# Patient Record
Sex: Female | Born: 1945 | Race: White | Hispanic: No | Smoking: Never smoker
Health system: Southern US, Community
[De-identification: ages and names within clinical notes are randomized; demographics above are authoritative.]

## PROBLEM LIST (undated history)

## (undated) DIAGNOSIS — D241 Benign neoplasm of right breast: Secondary | ICD-10-CM

## (undated) DIAGNOSIS — Z87442 Personal history of urinary calculi: Secondary | ICD-10-CM

## (undated) DIAGNOSIS — Z98811 Dental restoration status: Secondary | ICD-10-CM

## (undated) DIAGNOSIS — I10 Essential (primary) hypertension: Secondary | ICD-10-CM

## (undated) DIAGNOSIS — E785 Hyperlipidemia, unspecified: Secondary | ICD-10-CM

## (undated) DIAGNOSIS — T884XXA Failed or difficult intubation, initial encounter: Secondary | ICD-10-CM

## (undated) DIAGNOSIS — E119 Type 2 diabetes mellitus without complications: Secondary | ICD-10-CM

## (undated) DIAGNOSIS — J302 Other seasonal allergic rhinitis: Secondary | ICD-10-CM

## (undated) DIAGNOSIS — M1990 Unspecified osteoarthritis, unspecified site: Secondary | ICD-10-CM

## (undated) DIAGNOSIS — R51 Headache: Secondary | ICD-10-CM

## (undated) DIAGNOSIS — Z8709 Personal history of other diseases of the respiratory system: Secondary | ICD-10-CM

## (undated) DIAGNOSIS — K219 Gastro-esophageal reflux disease without esophagitis: Secondary | ICD-10-CM

## (undated) DIAGNOSIS — R682 Dry mouth, unspecified: Secondary | ICD-10-CM

## (undated) DIAGNOSIS — R519 Headache, unspecified: Secondary | ICD-10-CM

## (undated) HISTORY — PX: TUBAL LIGATION: SHX77

---

## 1998-04-18 ENCOUNTER — Ambulatory Visit (HOSPITAL_COMMUNITY): Admission: RE | Admit: 1998-04-18 | Discharge: 1998-04-18 | Payer: Self-pay | Admitting: Gastroenterology

## 1999-11-10 ENCOUNTER — Other Ambulatory Visit: Admission: RE | Admit: 1999-11-10 | Discharge: 1999-11-10 | Payer: Self-pay | Admitting: Obstetrics and Gynecology

## 2000-07-21 ENCOUNTER — Encounter: Admission: RE | Admit: 2000-07-21 | Discharge: 2000-07-21 | Payer: Self-pay | Admitting: Orthopedic Surgery

## 2000-07-21 ENCOUNTER — Encounter: Payer: Self-pay | Admitting: Orthopedic Surgery

## 2010-03-05 DIAGNOSIS — K219 Gastro-esophageal reflux disease without esophagitis: Secondary | ICD-10-CM | POA: Insufficient documentation

## 2010-05-06 DIAGNOSIS — I10 Essential (primary) hypertension: Secondary | ICD-10-CM | POA: Insufficient documentation

## 2010-06-18 DIAGNOSIS — E559 Vitamin D deficiency, unspecified: Secondary | ICD-10-CM | POA: Insufficient documentation

## 2010-07-29 DIAGNOSIS — E1142 Type 2 diabetes mellitus with diabetic polyneuropathy: Secondary | ICD-10-CM | POA: Insufficient documentation

## 2010-07-29 DIAGNOSIS — E1129 Type 2 diabetes mellitus with other diabetic kidney complication: Secondary | ICD-10-CM | POA: Insufficient documentation

## 2010-07-29 DIAGNOSIS — Z794 Long term (current) use of insulin: Secondary | ICD-10-CM | POA: Insufficient documentation

## 2010-08-28 DIAGNOSIS — E782 Mixed hyperlipidemia: Secondary | ICD-10-CM | POA: Insufficient documentation

## 2011-01-01 DIAGNOSIS — F411 Generalized anxiety disorder: Secondary | ICD-10-CM | POA: Insufficient documentation

## 2012-11-02 ENCOUNTER — Ambulatory Visit
Admission: RE | Admit: 2012-11-02 | Discharge: 2012-11-02 | Disposition: A | Discharge status: Home / Self Care | Payer: Medicare Other | Source: Ambulatory Visit | Attending: Emergency Medicine | Admitting: Emergency Medicine

## 2012-11-02 ENCOUNTER — Ambulatory Visit (INDEPENDENT_AMBULATORY_CARE_PROVIDER_SITE_OTHER): Payer: Medicare Other | Admitting: Emergency Medicine

## 2012-11-02 ENCOUNTER — Telehealth: Payer: Self-pay | Admitting: Radiology

## 2012-11-02 ENCOUNTER — Ambulatory Visit: Payer: Medicare Other

## 2012-11-02 ENCOUNTER — Other Ambulatory Visit: Payer: Self-pay | Admitting: Radiology

## 2012-11-02 VITALS — BP 128/86 | HR 79 | Temp 98.4°F | Resp 18 | Wt 212.0 lb

## 2012-11-02 DIAGNOSIS — R3989 Other symptoms and signs involving the genitourinary system: Secondary | ICD-10-CM

## 2012-11-02 DIAGNOSIS — M545 Low back pain: Secondary | ICD-10-CM

## 2012-11-02 DIAGNOSIS — R1084 Generalized abdominal pain: Secondary | ICD-10-CM

## 2012-11-02 DIAGNOSIS — R109 Unspecified abdominal pain: Secondary | ICD-10-CM

## 2012-11-02 DIAGNOSIS — N200 Calculus of kidney: Secondary | ICD-10-CM

## 2012-11-02 DIAGNOSIS — R319 Hematuria, unspecified: Secondary | ICD-10-CM

## 2012-11-02 LAB — POCT URINALYSIS DIPSTICK
Bilirubin, UA: NEGATIVE
Glucose, UA: NEGATIVE
Nitrite, UA: NEGATIVE
Protein, UA: 30
Spec Grav, UA: >=1.03
Urobilinogen, UA: 0.2
pH, UA: 5.5

## 2012-11-02 LAB — POCT UA - MICROSCOPIC ONLY
Casts, Ur, LPF, POC: NEGATIVE
Crystals, Ur, HPF, POC: NEGATIVE
Mucus, UA: NEGATIVE
Yeast, UA: NEGATIVE

## 2012-11-02 LAB — POCT CBC
Granulocyte percent: 64.1 %G (ref 37–80)
MID (cbc): 0.4 (ref 0–0.9)
MPV: 7.8 fL (ref 0–99.8)
POC MID %: 6 %M (ref 0–12)
Platelet Count, POC: 185 10*3/uL (ref 142–424)
RBC: 4.66 M/uL (ref 4.04–5.48)

## 2012-11-02 MED ORDER — CIPROFLOXACIN HCL 250 MG PO TABS: 250.0000 mg | ORAL_TABLET | Freq: Two times a day (BID) | ORAL | Status: DC

## 2012-11-02 MED ORDER — HYDROCODONE-ACETAMINOPHEN 5-325 MG PO TABS: 1.0000 | ORAL_TABLET | Freq: Four times a day (QID) | ORAL | Status: DC | PRN

## 2012-11-02 NOTE — Patient Instructions (Addendum)

## 2012-11-02 NOTE — Telephone Encounter (Signed)
Patient has appt scheduled with Dr Isabel Caprice for Friday 3/14 at 1030 am

## 2012-11-02 NOTE — Progress Notes (Signed)
  Subjective:    Patient ID: Janice Payne, female    DOB: February 06, 1946, 67 y.o.   MRN: [id number]  Back Pain This is a recurrent problem. The current episode started in the past 7 days. The problem occurs daily. The pain is present in the lumbar spine. The quality of the pain is described as aching. The pain does not radiate. The pain is at a severity of 5/10. The pain is mild. Pertinent negatives include no fever.    Janice Payne comes into our office today with complaints of lower back pain on her left side that's been going on for 1 week. She also has complaints with her urine being cloudy for 1 week also She went to [facility] to check her back out and they told her she pulled a muscle in her back they didn't check her urine or do a xray of back  Review of Systems  Constitutional: Negative for fever, chills and fatigue.  Gastrointestinal: Negative for nausea.  Musculoskeletal: Positive for back pain.       Objective:   Physical Exam There is mild tenderness left flank area. There is mild LLQ abdominal pain.  Results for orders placed in visit on 11/02/12  POCT UA - MICROSCOPIC ONLY      Result Value Range   WBC, Ur, HPF, POC 6-10     RBC, urine, microscopic 5-8     Bacteria, U Microscopic 1+     Mucus, UA neg     Epithelial cells, urine per micros 3-5     Crystals, Ur, HPF, POC neg     Casts, Ur, LPF, POC neg     Yeast, UA neg    POCT URINALYSIS DIPSTICK      Result Value Range   Color, UA amber     Clarity, UA clear     Glucose, UA neg     Bilirubin, UA neg     Ketones, UA trace     Spec Grav, UA >=1.030     Blood, UA large     pH, UA 5.5     Protein, UA 30     Urobilinogen, UA 0.2     Nitrite, UA neg     Leukocytes, UA Trace    POCT CBC      Result Value Range   WBC 6.8  4.6 - 10.2 K/uL   Lymph, poc 2.0  0.6 - 3.4   POC LYMPH PERCENT 29.9  10 - 50 %L   MID (cbc) 0.4  0 - 0.9   POC MID % 6.0  0 - 12 %M   POC Granulocyte 4.4  2 - 6.9   Granulocyte percent 64.1  37 -  80 %G   RBC 4.66  4.04 - 5.48 M/uL   Hemoglobin 13.0  12.2 - 16.2 g/dL   HCT, POC 27.2  53.6 - 47.9 %   MCV 88.7  80 - 97 fL   MCH, POC 27.9  27 - 31.2 pg   MCHC 31.5 (*) 31.8 - 35.4 g/dL   RDW, POC 64.4     Platelet Count, POC 185  142 - 424 K/uL   MPV 7.8  0 - 99.8 fL  [facility] reading (PRIMARY) by  Dr.Daub 3-4 mm calcification on left kidney       Assessment & Plan:  Need to do CT urogram for suspected stone on left kidney

## 2012-11-03 ENCOUNTER — Encounter: Payer: Self-pay | Admitting: Radiology

## 2012-11-03 DIAGNOSIS — N200 Calculus of kidney: Secondary | ICD-10-CM | POA: Insufficient documentation

## 2012-11-03 LAB — URINE CULTURE: Colony Count: 4000

## 2012-11-07 ENCOUNTER — Other Ambulatory Visit: Payer: Self-pay | Admitting: Emergency Medicine

## 2012-11-07 ENCOUNTER — Telehealth: Payer: Self-pay

## 2012-11-07 DIAGNOSIS — M545 Low back pain: Secondary | ICD-10-CM

## 2012-11-07 MED ORDER — HYDROCODONE-ACETAMINOPHEN 5-325 MG PO TABS: 1.0000 | ORAL_TABLET | Freq: Four times a day (QID) | ORAL | Status: DC | PRN

## 2012-11-07 NOTE — Telephone Encounter (Signed)
HYDROcodone-acetaminophen (NORCO) 5-325 MG per tablet  Request refill for the above - kidney stones   [facility] [address]  [phone] (H)

## 2012-11-07 NOTE — Telephone Encounter (Signed)
Is okay to refill her pain medication. Patient is now under the care of the urologist and is contemplating lithotripsy to her stone. Have her keep [place] informed regarding her progress

## 2012-11-07 NOTE — Telephone Encounter (Signed)
Called in- patient advised

## 2012-11-09 ENCOUNTER — Other Ambulatory Visit: Payer: Self-pay | Admitting: Urology

## 2012-11-10 ENCOUNTER — Encounter (HOSPITAL_COMMUNITY): Payer: Self-pay | Admitting: *Deleted

## 2012-11-10 NOTE — Pre-Procedure Instructions (Signed)
Asked to bring blue folder the day of the procedure,insurance card,I.D. driver's license,wear comfortable clothing and have a driver for the day. Asked not to take Advil,Motrin,Ibuprofen,Aleve or any NSAIDS, Aspirin, or Toradol for 72 hours prior to procedure,  No vitamins or herbal medications 7 days prior to procedure. Instructed to take laxative per doctor's office instructions and eat a light dinner the evening before procedure.   To arrive at 0800  for lithotripsy procedure.

## 2012-11-13 ENCOUNTER — Encounter (HOSPITAL_COMMUNITY): Payer: Self-pay | Admitting: Pharmacy Technician

## 2012-11-16 ENCOUNTER — Other Ambulatory Visit: Payer: Self-pay | Admitting: Emergency Medicine

## 2012-11-24 NOTE — H&P (Signed)
History of Present Illness   Janice Payne presents today as a referral from [facility]/Dr. Earl Lites for further assessment and potential treatment of a relatively newly diagnosed left renal calculus. [name] has had some intermittent nonspecific low left back discomfort. The patient's pain does on occasion radiate up her back as well. She has noticed some rusty/brown-colored urine as well. She has had a variety of imaging studies. She did have an abdominal x-ray, which suggested a 9 mm stone over the left kidney shadow. She also had lumbar films, which had suggested this calcification may be as big as 12 mm. More definitively, she did have a CT scan, which revealed a 6 x 9 mm calcification in the left renal pelvis. There was no renal obstruction at that time, but it was felt that the stone may be intermittently obstructing. Urinalysis today does show a continued presence of microhematuria. Urine pH is 6.0. There is no evidence of active infection. She has no prior history of nephrolithiasis. Her sister has had a stone in the past.       Past Medical History Problems  1. History of  Anxiety (Symptom) 300.00 2. History of  Arthritis V13.4 3. History of  Diabetes Mellitus 250.00 4. History of  Heartburn 787.1 5. History of  Hypercholesterolemia 272.0 6. History of  Hypertension 401.9  Surgical History Problems  1. History of  No Surgical Problems  Current Meds 1. Adult Aspirin Low Strength 81 MG Oral Tablet Dispersible; Therapy: (Recorded:14Mar2014) to 2. ALPRAZolam 0.5 MG Oral Tablet; Therapy: 14Oct2013 to 3. Calcium + D TABS; Therapy: (Recorded:14Mar2014) to 4. Cholecalciferol CRYS; Therapy: (Recorded:14Mar2014) to 5. Fenofibrate 160 MG Oral Tablet; Therapy: 31Oct2013 to 6. Hydrocodone-Acetaminophen 5-325 MG Oral Tablet; Therapy: 13Mar2014 to 7. Losartan Potassium 50 MG Oral Tablet; Therapy: 28Dec2012 to 8. MetFORMIN HCl 500 MG Oral Tablet; Therapy: (Recorded:14Mar2014)  to 9. Metoprolol Succinate ER 50 MG Oral Tablet Extended Release 24 Hour; Therapy: 27Sep2013 to 10. Pravastatin Sodium 20 MG Oral Tablet; Therapy: 03Oct2013 to 11. Sertraline HCl 50 MG Oral Tablet; Therapy: 10Jul2013 to  Allergies Medication  1. Penicillins 2. Codeine Derivatives  Family History Problems  1. Paternal history of  Acute Myocardial Infarction V17.3 2. Paternal history of  Death In The Family Father 68yrs, MI 3. Maternal history of  Death In The Family Mother 72yrs 4. Family history of  Family Health Status Number Of Children 3 sons 5. Sororal history of  Nephrolithiasis  Social History Problems    Caffeine Use 2-3 qd   Marital History - Widowed   Never A Smoker   Occupation: retired Denied    History of  Alcohol Use   History of  Tobacco Use  Review of Systems Genitourinary, constitutional, skin, eye, otolaryngeal, hematologic/lymphatic, cardiovascular, pulmonary, endocrine, musculoskeletal, gastrointestinal, neurological and psychiatric system(s) were reviewed and pertinent findings if present are noted.  Genitourinary: urinary urgency, nocturia and hematuria.  Musculoskeletal: back pain.    Vitals Vital Signs [Data Includes: Last 1 Day]  14Mar2014 11:00AM  BMI Calculated: 36.19 BSA Calculated: 2.01 Height: 5 ft 4 in Weight: 212 lb  Blood Pressure: 145 / 77 Temperature: 97.2 F Heart Rate: 83  Physical Exam Constitutional: Well nourished and well developed . No acute distress.  ENT:. The ears and nose are normal in appearance.  Neck: The appearance of the neck is normal and no neck mass is present.  Pulmonary: No respiratory distress and normal respiratory rhythm and effort.  Cardiovascular: Heart rate and rhythm are normal .  No peripheral edema.  Abdomen: The abdomen is soft and nontender. No masses are palpated. No CVA tenderness. No hernias are palpable. No hepatosplenomegaly noted.  Lymphatics: The femoral and inguinal nodes are not  enlarged or tender.  Skin: Normal skin turgor, no visible rash and no visible skin lesions.  Neuro/Psych:. Mood and affect are appropriate.    Results/Data Urine [Data Includes: Last 1 Day]   14Mar2014  COLOR YELLOW   APPEARANCE CLEAR   SPECIFIC GRAVITY 1.025   pH 6.0   GLUCOSE NEG mg/dL  BILIRUBIN NEG   KETONE NEG mg/dL  BLOOD MOD   PROTEIN NEG mg/dL  UROBILINOGEN 0.2 mg/dL  NITRITE NEG   LEUKOCYTE ESTERASE NEG   SQUAMOUS EPITHELIAL/HPF RARE   WBC 0-2 WBC/hpf  RBC 7-10 RBC/hpf  BACTERIA RARE   CRYSTALS NONE SEEN   CASTS NONE SEEN    Assessment Assessed  1. Nephrolithiasis 592.0 2. Gross Hematuria 599.71  Discussion/Summary   [name] has been having some nonspecific left lower-back discomfort. There are aspects of her pain that are consistent with renal colic and other aspects that are more suggestive of musculoskeletal etiology. She does, however, have a 9+ mm stone in her left renal pelvis. It is currently not causing any obstruction but certainly may be ball-valving in and out and intermittently causing some blockage as well as the hematuria. Certainly, this stone should be treated. I did tell her, however, I cannot guarantee her that her back pain will resolve. Hounsfield units on the stone is in the 4-500 range. I am able to see the stone on scout imaging, but it is somewhat faint. This would suggest, however, that the stone is not terribly dense and I do think a good candidate's stone for ESWL. I would be hopeful that with this stone burden, she would be able to pass the fragments without much difficulty and have quoted her approximately an 80% chance of success with ESWL. We did talk about the pros and cons of that procedure. We will attempt to get her on the schedule for the next available opening for ESWL to treat this, along with some routine followup.

## 2012-11-27 ENCOUNTER — Ambulatory Visit (HOSPITAL_COMMUNITY)
Admission: RE | Admit: 2012-11-27 | Discharge: 2012-11-27 | Disposition: A | Discharge status: Home / Self Care | Payer: Medicare Other | Source: Ambulatory Visit | Attending: Urology | Admitting: Urology

## 2012-11-27 ENCOUNTER — Encounter (HOSPITAL_COMMUNITY): Payer: Self-pay | Admitting: *Deleted

## 2012-11-27 ENCOUNTER — Telehealth: Payer: Self-pay

## 2012-11-27 ENCOUNTER — Encounter (HOSPITAL_COMMUNITY)
Admission: RE | Disposition: A | Discharge status: Home / Self Care | Payer: Self-pay | Source: Ambulatory Visit | Attending: Urology

## 2012-11-27 ENCOUNTER — Ambulatory Visit (HOSPITAL_COMMUNITY): Payer: Medicare Other

## 2012-11-27 DIAGNOSIS — N210 Calculus in bladder: Secondary | ICD-10-CM | POA: Insufficient documentation

## 2012-11-27 DIAGNOSIS — R3129 Other microscopic hematuria: Secondary | ICD-10-CM | POA: Insufficient documentation

## 2012-11-27 DIAGNOSIS — N200 Calculus of kidney: Secondary | ICD-10-CM | POA: Insufficient documentation

## 2012-11-27 HISTORY — DX: Essential (primary) hypertension: I10

## 2012-11-27 HISTORY — PX: EXTRACORPOREAL SHOCK WAVE LITHOTRIPSY: SHX1557

## 2012-11-27 LAB — BASIC METABOLIC PANEL
Calcium: 9.4 mg/dL (ref 8.4–10.5)
GFR calc Af Amer: 63 mL/min — ABNORMAL LOW (ref >90)
GFR calc non Af Amer: 54 mL/min — ABNORMAL LOW (ref >90)
Glucose, Bld: 172 mg/dL — ABNORMAL HIGH (ref 70–99)
Potassium: 3.6 mEq/L (ref 3.5–5.1)
Sodium: 136 mEq/L (ref 135–145)

## 2012-11-27 SURGERY — LITHOTRIPSY, ESWL
Anesthesia: LOCAL | Laterality: Left

## 2012-11-27 MED ORDER — DEXTROSE-NACL 5-0.45 % IV SOLN
INTRAVENOUS | Status: DC
  Administered 2012-11-27: 09:00:00 via INTRAVENOUS

## 2012-11-27 MED ORDER — DIPHENHYDRAMINE HCL 25 MG PO CAPS
25.0000 mg | ORAL_CAPSULE | ORAL | Status: AC
  Administered 2012-11-27: 25 mg via ORAL
  Filled 2012-11-27: qty 1

## 2012-11-27 MED ORDER — DIAZEPAM 5 MG PO TABS
10.0000 mg | ORAL_TABLET | ORAL | Status: AC
  Administered 2012-11-27: 10 mg via ORAL
  Filled 2012-11-27: qty 2

## 2012-11-27 MED ORDER — DIAZEPAM 5 MG PO TABS
ORAL_TABLET | ORAL | Status: AC
  Filled 2012-11-27: qty 1

## 2012-11-27 MED ORDER — HYDROCODONE-ACETAMINOPHEN 5-325 MG PO TABS: ORAL_TABLET | ORAL | Status: DC

## 2012-11-27 MED ORDER — CIPROFLOXACIN HCL 500 MG PO TABS
500.0000 mg | ORAL_TABLET | ORAL | Status: AC
  Administered 2012-11-27: 500 mg via ORAL
  Filled 2012-11-27: qty 1

## 2012-11-27 MED ORDER — DIAZEPAM 5 MG PO TABS
ORAL_TABLET | ORAL | Status: DC
Start: 2012-11-27 — End: 2012-11-27
  Filled 2012-11-27: qty 1

## 2012-11-27 NOTE — Telephone Encounter (Signed)
Patient had her kidney stones blasted and the surgeon told her to call dr Cleta Alberts to get her pain meds filled she would need that called [address] [facility] [phone]

## 2012-11-27 NOTE — Telephone Encounter (Signed)
It is okay to go ahead and refill her medications.

## 2012-11-27 NOTE — Progress Notes (Signed)
Reddened area on left flank from lithotripsy. No drainage or edema noted.

## 2012-11-27 NOTE — Telephone Encounter (Signed)
Called in for her, called her to advise.

## 2012-11-27 NOTE — Interval H&P Note (Signed)
History and Physical Interval Note:  11/27/2012 11:35 AM  Janice Payne  has presented today for surgery, with the diagnosis of Left Renal Calculus  The various methods of treatment have been discussed with the patient and family. After consideration of risks, benefits and other options for treatment, the patient has consented to  Procedure(s): EXTRACORPOREAL SHOCK WAVE LITHOTRIPSY (ESWL) (Left) as a surgical intervention .  The patient's history has been reviewed, patient examined, no change in status, stable for surgery.  I have reviewed the patient's chart and labs.  Questions were answered to the patient's satisfaction.     Brittannie Tawney S

## 2012-11-27 NOTE — Telephone Encounter (Signed)
Please advise on hydrocodone refill, pended

## 2012-11-27 NOTE — Op Note (Signed)
See [facility] OP note scanned into chart. 

## 2012-12-01 ENCOUNTER — Other Ambulatory Visit: Payer: Self-pay | Admitting: Emergency Medicine

## 2013-05-08 DIAGNOSIS — N1832 Chronic kidney disease, stage 3b: Secondary | ICD-10-CM | POA: Insufficient documentation

## 2013-05-08 DIAGNOSIS — N183 Chronic kidney disease, stage 3 unspecified: Secondary | ICD-10-CM | POA: Insufficient documentation

## 2013-10-01 LAB — HM MAMMOGRAPHY

## 2013-10-08 ENCOUNTER — Encounter (INDEPENDENT_AMBULATORY_CARE_PROVIDER_SITE_OTHER): Payer: Self-pay | Admitting: Surgery

## 2013-10-08 ENCOUNTER — Ambulatory Visit (INDEPENDENT_AMBULATORY_CARE_PROVIDER_SITE_OTHER): Payer: Medicare Other | Admitting: Surgery

## 2013-10-08 VITALS — BP 132/80 | HR 88 | Temp 98.3°F | Resp 14 | Ht 65.5 in | Wt 213.8 lb

## 2013-10-08 DIAGNOSIS — D249 Benign neoplasm of unspecified breast: Secondary | ICD-10-CM | POA: Insufficient documentation

## 2013-10-08 NOTE — Patient Instructions (Signed)
Lumpectomy A lumpectomy is a form of "breast conserving" or "breast preservation" surgery. It may also be referred to as a partial mastectomy. During a lumpectomy, the portion of the breast that contains the cancerous tumor or breast mass (the lump) is removed. Some normal tissue around the lump may also be removed to make sure all the tumor has been removed. This surgery should take 40 minutes or less. LET YOUR HEALTH CARE PROVIDER KNOW ABOUT:  Any allergies you have.  All medicines you are taking, including vitamins, herbs, eye drops, creams, and over-the-counter medicines.  Previous problems you or members of your family have had with the use of anesthetics.  Any blood disorders you have.  Previous surgeries you have had.  Medical conditions you have. RISKS AND COMPLICATIONS Generally, this is a safe procedure. However, as with any procedure, complications can occur. Possible complications include:  Bleeding.  Infection.  Pain.  Temporary swelling.  Change in the shape of the breast, particularly if a large portion is removed. BEFORE THE PROCEDURE  Ask your health care provider about changing or stopping your regular medicines.  Do not eat or drink anything for 7 8 hours before the surgery or as directed by your health care provider. Ask your health care provider if you can take a sip of water with any approved medicines.  On the day of surgery, your healthcare provider will use a mammogram or ultrasound to locate and mark the tumor in your breast. These markings on your breast will show where the cut (incision) will be made. PROCEDURE   An IV tube will be put into one of your veins.  You may be given medicine to help you relax before the surgery (sedative). You will be given one of the following:  A medicine that numbs the area (local anesthesia).  A medicine that makes you go to sleep (general anesthesia).  Your health care provider will use a kind of electric scalpel  that uses heat to minimize bleeding (electrocautery knife).  A curved incision (like a smile or frown) that follows the natural curve of your breast is made, to allow for minimal scarring and better healing.  The tumor will be removed with some of the surrounding tissue. This will be sent to the lab for analysis. Your health care provider may also remove your lymph nodes at this time if needed.  Sometimes, but not always, a rubber tube called a drain will be surgically inserted into your breast area or armpit to collect excess fluid that may accumulate in the space where the tumor was. This drain is connected to a plastic bulb on the outside of your body. This drain creates suction to help remove the fluid.  The incisions will be closed with stitches (sutures).  A bandage may be placed over the incisions. AFTER THE PROCEDURE  You will be taken to the recovery area.  You will be given medicine for pain.  A small rubber drain may be placed in the breast for 2 3 days to prevent a collection of blood (hematoma) from developing in the breast. You will be given instructions on caring for the drain before you go home.  A pressure bandage (dressing) will be applied for 1 2 days to prevent bleeding. Ask your health care provider how to care for your bandage at home. Document Released: 09/20/2006 Document Revised: 04/11/2013 Document Reviewed: 01/12/2013 Exit[employer] Patient Information 2014 [employer].  

## 2013-10-08 NOTE — Progress Notes (Signed)
Patient ID: Janice Payne, female   DOB: 1946-01-24, 68 y.o.   MRN: [id number]  Chief Complaint  Patient presents with  . New Evaluation    eval Rt br papilloma    HPI Janice Payne is a 68 y.o. female.  Patient sent at the request of Dr. Christene Slates of [facility] for right breast papilloma. Patient had area of mammographic abnormality right central lower breast on routine screening mammography. Core biopsy showed papilloma. She denies any history of breast pain, nipple discharge or breast mass bilaterally. HPI  Past Medical History  Diagnosis Date  . Allergy   . Diabetes mellitus without complication   . Hypertension   . Asthma     as child  . Pneumonia     h/o 2012  . Chronic kidney disease     kidney stone    Past Surgical History  Procedure Laterality Date  . Tubal ligation    .  mole removed from back      Family History  Problem Relation Age of Onset  . Heart disease Father     Social History History  Substance Use Topics  . Smoking status: Never Smoker   . Smokeless tobacco: Never Used  . Alcohol Use: No    Allergies  Allergen Reactions  . Codeine Nausea And Vomiting  . Niaspan  [Niacin Er] Itching  . Penicillins Rash    Current Outpatient Prescriptions  Medication Sig Dispense Refill  . ALPRAZolam (XANAX) 0.5 MG tablet Take 0.5 mg by mouth at bedtime as needed for sleep.      Marland Kitchen aspirin 81 MG tablet Take 81 mg by mouth daily.      . calcium-vitamin D (OSCAL WITH D) 500-200 MG-UNIT per tablet Take 1 tablet by mouth 2 (two) times daily.      . cetirizine (ZYRTEC) 10 MG tablet Take 10 mg by mouth daily.      . Cholecalciferol (VITAMIN D) 2000 UNITS tablet Take 2,000 Units by mouth daily.      . fenofibrate 160 MG tablet Take 160 mg by mouth daily before breakfast.      . glimepiride (AMARYL) 2 MG tablet       . losartan (COZAAR) 50 MG tablet Take 50 mg by mouth daily before breakfast.      . metFORMIN (GLUCOPHAGE-XR) 500 MG 24 hr  tablet Take 500 mg by mouth 2 (two) times daily.      . metoprolol succinate (TOPROL-XL) 50 MG 24 hr tablet Take 50 mg by mouth daily before breakfast. Take with or immediately following a meal.      . pravastatin (PRAVACHOL) 20 MG tablet Take 20 mg by mouth at bedtime.       . sertraline (ZOLOFT) 50 MG tablet Take 50 mg by mouth at bedtime.        No current facility-administered medications for this visit.    Review of Systems Review of Systems  Constitutional: Negative for fever, chills and unexpected weight change.  HENT: Negative for congestion, hearing loss, sore throat, trouble swallowing and voice change.   Eyes: Negative for visual disturbance.  Respiratory: Negative for cough and wheezing.   Cardiovascular: Negative for chest pain, palpitations and leg swelling.  Gastrointestinal: Negative for nausea, vomiting, abdominal pain, diarrhea, constipation, blood in stool, abdominal distention and anal bleeding.  Genitourinary: Negative for hematuria, vaginal bleeding and difficulty urinating.  Musculoskeletal: Negative for arthralgias.  Skin: Negative for rash and wound.  Neurological: Negative for  seizures, syncope and headaches.  Hematological: Negative for adenopathy. Does not bruise/bleed easily.  Psychiatric/Behavioral: Negative for confusion.    Blood pressure 132/80, pulse 88, temperature 98.3 F (36.8 C), temperature source Oral, resp. rate 14, height 5' 5.5" (1.664 m), weight 213 lb 12.8 oz (96.979 kg).  Physical Exam Physical Exam  Constitutional: She is oriented to person, place, and time. She appears well-developed and well-nourished.  HENT:  Head: Normocephalic and atraumatic.  Eyes: Pupils are equal, round, and reactive to light. No scleral icterus.  Neck: Normal range of motion.  Cardiovascular: Normal rate and regular rhythm.   Pulmonary/Chest: Effort normal and breath sounds normal. Right breast exhibits no inverted nipple, no mass, no nipple discharge, no skin  change and no tenderness. Left breast exhibits no nipple discharge, no skin change and no tenderness. Breasts are symmetrical.    Musculoskeletal: Normal range of motion.  Neurological: She is alert and oriented to person, place, and time.  Skin: Skin is warm and dry.  Psychiatric: She has a normal mood and affect. Her behavior is normal. Judgment and thought content normal.    Data Reviewed Mammogram [facility] and pathology   papilloma  Assessment    Right breast papilloma core biopsy    Plan    Recommended excision of right breast papilloma needle localized. Risk of malignancy is 2-3%.The procedure has been discussed with the patient. Alternatives to surgery have been discussed with the patient.  Risks of surgery include bleeding,  Infection,  Seroma formation, death,  and the need for further surgery.   The patient understands and wishes to proceed.       [name] 10/08/2013, 12:03 PM

## 2013-10-21 DIAGNOSIS — D241 Benign neoplasm of right breast: Secondary | ICD-10-CM

## 2013-10-21 HISTORY — DX: Benign neoplasm of right breast: D24.1

## 2013-10-23 ENCOUNTER — Encounter (INDEPENDENT_AMBULATORY_CARE_PROVIDER_SITE_OTHER): Payer: Self-pay

## 2013-11-08 ENCOUNTER — Encounter (HOSPITAL_BASED_OUTPATIENT_CLINIC_OR_DEPARTMENT_OTHER): Payer: Self-pay | Admitting: *Deleted

## 2013-11-08 NOTE — Pre-Procedure Instructions (Signed)
To come for EKG 

## 2013-11-12 ENCOUNTER — Other Ambulatory Visit: Payer: Self-pay

## 2013-11-12 ENCOUNTER — Encounter (HOSPITAL_BASED_OUTPATIENT_CLINIC_OR_DEPARTMENT_OTHER)
Admission: RE | Admit: 2013-11-12 | Discharge: 2013-11-12 | Disposition: A | Discharge status: Home / Self Care | Payer: Medicare Other | Source: Ambulatory Visit | Attending: Surgery | Admitting: Surgery

## 2013-11-14 ENCOUNTER — Encounter (HOSPITAL_BASED_OUTPATIENT_CLINIC_OR_DEPARTMENT_OTHER): Payer: Self-pay

## 2013-11-14 ENCOUNTER — Encounter (HOSPITAL_BASED_OUTPATIENT_CLINIC_OR_DEPARTMENT_OTHER): Payer: Medicare Other | Admitting: Certified Registered"

## 2013-11-14 ENCOUNTER — Encounter (HOSPITAL_BASED_OUTPATIENT_CLINIC_OR_DEPARTMENT_OTHER)
Admission: RE | Disposition: A | Discharge status: Home / Self Care | Payer: Self-pay | Source: Ambulatory Visit | Attending: Surgery

## 2013-11-14 ENCOUNTER — Ambulatory Visit (HOSPITAL_BASED_OUTPATIENT_CLINIC_OR_DEPARTMENT_OTHER): Payer: Medicare Other | Admitting: Certified Registered"

## 2013-11-14 ENCOUNTER — Ambulatory Visit (HOSPITAL_BASED_OUTPATIENT_CLINIC_OR_DEPARTMENT_OTHER)
Admission: RE | Admit: 2013-11-14 | Discharge: 2013-11-14 | Disposition: A | Discharge status: Home / Self Care | Payer: Medicare Other | Source: Ambulatory Visit | Attending: Surgery | Admitting: Surgery

## 2013-11-14 DIAGNOSIS — D249 Benign neoplasm of unspecified breast: Secondary | ICD-10-CM | POA: Insufficient documentation

## 2013-11-14 DIAGNOSIS — E119 Type 2 diabetes mellitus without complications: Secondary | ICD-10-CM | POA: Insufficient documentation

## 2013-11-14 DIAGNOSIS — I10 Essential (primary) hypertension: Secondary | ICD-10-CM | POA: Insufficient documentation

## 2013-11-14 DIAGNOSIS — Z01810 Encounter for preprocedural cardiovascular examination: Secondary | ICD-10-CM | POA: Insufficient documentation

## 2013-11-14 HISTORY — DX: Personal history of urinary calculi: Z87.442

## 2013-11-14 HISTORY — DX: Hyperlipidemia, unspecified: E78.5

## 2013-11-14 HISTORY — DX: Gastro-esophageal reflux disease without esophagitis: K21.9

## 2013-11-14 HISTORY — DX: Headache, unspecified: R51.9

## 2013-11-14 HISTORY — DX: Other seasonal allergic rhinitis: J30.2

## 2013-11-14 HISTORY — PX: BREAST BIOPSY: SHX20

## 2013-11-14 HISTORY — DX: Dental restoration status: Z98.811

## 2013-11-14 HISTORY — DX: Headache: R51

## 2013-11-14 HISTORY — DX: Dry mouth, unspecified: R68.2

## 2013-11-14 HISTORY — DX: Unspecified osteoarthritis, unspecified site: M19.90

## 2013-11-14 HISTORY — DX: Benign neoplasm of right breast: D24.1

## 2013-11-14 HISTORY — DX: Personal history of other diseases of the respiratory system: Z87.09

## 2013-11-14 HISTORY — DX: Type 2 diabetes mellitus without complications: E11.9

## 2013-11-14 LAB — GLUCOSE, CAPILLARY
Glucose-Capillary: 150 mg/dL — ABNORMAL HIGH (ref 70–99)
Glucose-Capillary: 129 mg/dL — ABNORMAL HIGH (ref 70–99)

## 2013-11-14 LAB — POCT HEMOGLOBIN-HEMACUE: HEMOGLOBIN: 14 g/dL (ref 12.0–15.0)

## 2013-11-14 SURGERY — BREAST BIOPSY WITH NEEDLE LOCALIZATION
Anesthesia: General | Laterality: Right

## 2013-11-14 MED ORDER — ONDANSETRON HCL 4 MG/2ML IJ SOLN
INTRAMUSCULAR | Status: DC | PRN
  Administered 2013-11-14: 4 mg via INTRAVENOUS

## 2013-11-14 MED ORDER — CHLORHEXIDINE GLUCONATE 4 % EX LIQD: 1.0000 "application " | Freq: Once | CUTANEOUS | Status: DC

## 2013-11-14 MED ORDER — MEPERIDINE HCL 25 MG/ML IJ SOLN: 6.2500 mg | INTRAMUSCULAR | Status: DC | PRN

## 2013-11-14 MED ORDER — EPHEDRINE SULFATE 50 MG/ML IJ SOLN
INTRAMUSCULAR | Status: DC | PRN
  Administered 2013-11-14: 5 mg via INTRAVENOUS
  Administered 2013-11-14 (×2): 10 mg via INTRAVENOUS

## 2013-11-14 MED ORDER — VANCOMYCIN HCL IN DEXTROSE 1-5 GM/200ML-% IV SOLN
INTRAVENOUS | Status: AC
  Filled 2013-11-14: qty 200

## 2013-11-14 MED ORDER — FENTANYL CITRATE 0.05 MG/ML IJ SOLN
INTRAMUSCULAR | Status: DC | PRN
  Administered 2013-11-14: 100 ug via INTRAVENOUS

## 2013-11-14 MED ORDER — MIDAZOLAM HCL 2 MG/2ML IJ SOLN
INTRAMUSCULAR | Status: AC
  Filled 2013-11-14: qty 2

## 2013-11-14 MED ORDER — FENTANYL CITRATE 0.05 MG/ML IJ SOLN
INTRAMUSCULAR | Status: AC
  Filled 2013-11-14: qty 4

## 2013-11-14 MED ORDER — PROPOFOL 10 MG/ML IV BOLUS
INTRAVENOUS | Status: DC | PRN
  Administered 2013-11-14: 200 mg via INTRAVENOUS

## 2013-11-14 MED ORDER — HYDROMORPHONE HCL PF 1 MG/ML IJ SOLN: 0.2500 mg | INTRAMUSCULAR | Status: DC | PRN

## 2013-11-14 MED ORDER — LIDOCAINE HCL (CARDIAC) 20 MG/ML IV SOLN
INTRAVENOUS | Status: DC | PRN
  Administered 2013-11-14: 100 mg via INTRAVENOUS

## 2013-11-14 MED ORDER — OXYCODONE HCL 5 MG PO TABS
5.0000 mg | ORAL_TABLET | Freq: Once | ORAL | Status: AC | PRN
  Administered 2013-11-14: 5 mg via ORAL

## 2013-11-14 MED ORDER — DEXAMETHASONE SODIUM PHOSPHATE 4 MG/ML IJ SOLN
INTRAMUSCULAR | Status: DC | PRN
  Administered 2013-11-14: 4 mg via INTRAVENOUS

## 2013-11-14 MED ORDER — OXYCODONE HCL 5 MG/5ML PO SOLN: 5.0000 mg | Freq: Once | ORAL | Status: AC | PRN

## 2013-11-14 MED ORDER — LACTATED RINGERS IV SOLN
INTRAVENOUS | Status: DC
  Administered 2013-11-14 (×2): via INTRAVENOUS

## 2013-11-14 MED ORDER — MIDAZOLAM HCL 2 MG/2ML IJ SOLN: 1.0000 mg | INTRAMUSCULAR | Status: DC | PRN

## 2013-11-14 MED ORDER — BUPIVACAINE-EPINEPHRINE 0.25% -1:200000 IJ SOLN
INTRAMUSCULAR | Status: DC | PRN
  Administered 2013-11-14: 10 mL

## 2013-11-14 MED ORDER — TRAMADOL HCL 50 MG PO TABS: 50.0000 mg | ORAL_TABLET | Freq: Four times a day (QID) | ORAL | Status: DC | PRN

## 2013-11-14 MED ORDER — MIDAZOLAM HCL 2 MG/ML PO SYRP: 12.0000 mg | ORAL_SOLUTION | Freq: Once | ORAL | Status: DC | PRN

## 2013-11-14 MED ORDER — FENTANYL CITRATE 0.05 MG/ML IJ SOLN: 50.0000 ug | INTRAMUSCULAR | Status: DC | PRN

## 2013-11-14 MED ORDER — ONDANSETRON HCL 4 MG/2ML IJ SOLN: 4.0000 mg | Freq: Once | INTRAMUSCULAR | Status: DC | PRN

## 2013-11-14 MED ORDER — OXYCODONE HCL 5 MG PO TABS
ORAL_TABLET | ORAL | Status: AC
  Filled 2013-11-14: qty 1

## 2013-11-14 MED ORDER — VANCOMYCIN HCL IN DEXTROSE 1-5 GM/200ML-% IV SOLN
1000.0000 mg | INTRAVENOUS | Status: AC
  Administered 2013-11-14: 1000 mg via INTRAVENOUS

## 2013-11-14 SURGICAL SUPPLY — 49 items
APPLIER CLIP 9.375 MED OPEN (MISCELLANEOUS)
BINDER BREAST LRG (GAUZE/BANDAGES/DRESSINGS) IMPLANT
BINDER BREAST MEDIUM (GAUZE/BANDAGES/DRESSINGS) IMPLANT
BINDER BREAST XLRG (GAUZE/BANDAGES/DRESSINGS) IMPLANT
BINDER BREAST XXLRG (GAUZE/BANDAGES/DRESSINGS) IMPLANT
BLADE SURG 15 STRL LF DISP TIS (BLADE) ×1 IMPLANT
BLADE SURG 15 STRL SS (BLADE) ×2
CANISTER SUCT 1200ML W/VALVE (MISCELLANEOUS) ×3 IMPLANT
CHLORAPREP W/TINT 26ML (MISCELLANEOUS) ×3 IMPLANT
CLIP APPLIE 9.375 MED OPEN (MISCELLANEOUS) IMPLANT
CLIP TI WIDE RED SMALL 6 (CLIP) IMPLANT
COVER MAYO STAND STRL (DRAPES) ×3 IMPLANT
COVER TABLE BACK 60X90 (DRAPES) ×3 IMPLANT
DECANTER SPIKE VIAL GLASS SM (MISCELLANEOUS) ×3 IMPLANT
DERMABOND ADVANCED (GAUZE/BANDAGES/DRESSINGS) ×2
DERMABOND ADVANCED .7 DNX12 (GAUZE/BANDAGES/DRESSINGS) ×1 IMPLANT
DEVICE DUBIN W/COMP PLATE 8390 (MISCELLANEOUS) ×3 IMPLANT
DRAPE LAPAROSCOPIC ABDOMINAL (DRAPES) IMPLANT
DRAPE PED LAPAROTOMY (DRAPES) ×3 IMPLANT
DRAPE UTILITY XL STRL (DRAPES) ×3 IMPLANT
ELECT COATED BLADE 2.86 ST (ELECTRODE) ×3 IMPLANT
ELECT REM PT RETURN 9FT ADLT (ELECTROSURGICAL) ×3
ELECTRODE REM PT RTRN 9FT ADLT (ELECTROSURGICAL) ×1 IMPLANT
GLOVE BIOGEL PI IND STRL 7.0 (GLOVE) ×1 IMPLANT
GLOVE BIOGEL PI IND STRL 8 (GLOVE) ×1 IMPLANT
GLOVE BIOGEL PI INDICATOR 7.0 (GLOVE) ×2
GLOVE BIOGEL PI INDICATOR 8 (GLOVE) ×2
GLOVE ECLIPSE 6.5 STRL STRAW (GLOVE) ×3 IMPLANT
GLOVE ECLIPSE 8.0 STRL XLNG CF (GLOVE) ×3 IMPLANT
GOWN STRL REUS W/ TWL LRG LVL3 (GOWN DISPOSABLE) ×2 IMPLANT
GOWN STRL REUS W/TWL LRG LVL3 (GOWN DISPOSABLE) ×4
KIT MARKER MARGIN INK (KITS) ×3 IMPLANT
NEEDLE HYPO 25X1 1.5 SAFETY (NEEDLE) ×3 IMPLANT
NS IRRIG 1000ML POUR BTL (IV SOLUTION) ×3 IMPLANT
PACK BASIN DAY SURGERY FS (CUSTOM PROCEDURE TRAY) ×3 IMPLANT
PENCIL BUTTON HOLSTER BLD 10FT (ELECTRODE) ×3 IMPLANT
SLEEVE SCD COMPRESS KNEE MED (MISCELLANEOUS) ×3 IMPLANT
SPONGE LAP 4X18 X RAY DECT (DISPOSABLE) ×3 IMPLANT
STAPLER VISISTAT 35W (STAPLE) IMPLANT
SUT MON AB 4-0 PC3 18 (SUTURE) ×3 IMPLANT
SUT SILK 2 0 SH (SUTURE) IMPLANT
SUT VIC AB 3-0 SH 27 (SUTURE) ×2
SUT VIC AB 3-0 SH 27X BRD (SUTURE) ×1 IMPLANT
SYR CONTROL 10ML LL (SYRINGE) ×3 IMPLANT
TOWEL OR 17X24 6PK STRL BLUE (TOWEL DISPOSABLE) ×3 IMPLANT
TOWEL OR NON WOVEN STRL DISP B (DISPOSABLE) IMPLANT
TUBE CONNECTING 20'X1/4 (TUBING) ×1
TUBE CONNECTING 20X1/4 (TUBING) ×2 IMPLANT
YANKAUER SUCT BULB TIP NO VENT (SUCTIONS) ×3 IMPLANT

## 2013-11-14 NOTE — Anesthesia Preprocedure Evaluation (Addendum)
Anesthesia Evaluation  Patient identified by MRN, date of birth, ID band Patient awake    Reviewed: Allergy & Precautions, H&P , NPO status , Patient's Chart, lab work & pertinent test results  Airway Mallampati: I TM Distance: >3 FB Neck ROM: Full    Dental   Pulmonary          Cardiovascular hypertension, Pt. on medications     Neuro/Psych    GI/Hepatic   Endo/Other  diabetes, Well Controlled, Type 2, Oral Hypoglycemic Agents  Renal/GU      Musculoskeletal   Abdominal   Peds  Hematology   Anesthesia Other Findings   Reproductive/Obstetrics                           Anesthesia Physical Anesthesia Plan  ASA: II  Anesthesia Plan: General   Post-op Pain Management:    Induction: Intravenous  Airway Management Planned: LMA  Additional Equipment:   Intra-op Plan:   Post-operative Plan: Extubation in OR  Informed Consent: I have reviewed the patients History and Physical, chart, labs and discussed the procedure including the risks, benefits and alternatives for the proposed anesthesia with the patient or authorized representative who has indicated his/her understanding and acceptance.     Plan Discussed with: CRNA and Surgeon  Anesthesia Plan Comments:         Anesthesia Quick Evaluation  

## 2013-11-14 NOTE — Discharge Instructions (Signed)
[facility] °Office Phone Number [phone] ° °BREAST BIOPSY/ PARTIAL MASTECTOMY: POST OP INSTRUCTIONS ° °Always review your discharge instruction sheet given to you by the facility where your surgery was performed. ° °IF YOU HAVE DISABILITY OR FAMILY LEAVE FORMS, YOU MUST BRING THEM TO THE OFFICE FOR PROCESSING.  DO NOT GIVE THEM TO YOUR DOCTOR. ° °1. A prescription for pain medication may be given to you upon discharge.  Take your pain medication as prescribed, if needed.  If narcotic pain medicine is not needed, then you may take acetaminophen (Tylenol) or ibuprofen (Advil) as needed. °2. Take your usually prescribed medications unless otherwise directed °3. If you need a refill on your pain medication, please contact your pharmacy.  They will contact our office to request authorization.  Prescriptions will not be filled after 5pm or on week-ends. °4. You should eat very light the first 24 hours after surgery, such as soup, crackers, pudding, etc.  Resume your normal diet the day after surgery. °5. Most patients will experience some swelling and bruising in the breast.  Ice packs and a good support bra will help.  Swelling and bruising can take several days to resolve.  °6. It is common to experience some constipation if taking pain medication after surgery.  Increasing fluid intake and taking a stool softener will usually help or prevent this problem from occurring.  A mild laxative (Milk of Magnesia or Miralax) should be taken according to package directions if there are no bowel movements after 48 hours. °7. Unless discharge instructions indicate otherwise, you may remove your bandages 24-48 hours after surgery, and you may shower at that time.  You may have steri-strips (small skin tapes) in place directly over the incision.  These strips should be left on the skin for 7-10 days.  If your surgeon used skin glue on the incision, you may shower in 24 hours.  The glue will flake off over the  next 2-3 weeks.  Any sutures or staples will be removed at the office during your follow-up visit. °8. ACTIVITIES:  You may resume regular daily activities (gradually increasing) beginning the next day.  Wearing a good support bra or sports bra minimizes pain and swelling.  You may have sexual intercourse when it is comfortable. °a. You may drive when you no longer are taking prescription pain medication, you can comfortably wear a seatbelt, and you can safely maneuver your car and apply brakes. °b. RETURN TO WORK:  ______________________________________________________________________________________ °9. You should see your doctor in the office for a follow-up appointment approximately two weeks after your surgery.  Your doctor’s nurse will typically make your follow-up appointment when she calls you with your pathology report.  Expect your pathology report 2-3 business days after your surgery.  You may call to check if you do not hear from us after three days. °10. OTHER INSTRUCTIONS: _______________________________________________________________________________________________ _____________________________________________________________________________________________________________________________________ °_____________________________________________________________________________________________________________________________________ °_____________________________________________________________________________________________________________________________________ ° °WHEN TO CALL YOUR DOCTOR: °1. Fever over 101.0 °2. Nausea and/or vomiting. °3. Extreme swelling or bruising. °4. Continued bleeding from incision. °5. Increased pain, redness, or drainage from the incision. ° °The clinic staff is available to answer your questions during regular business hours.  Please don’t hesitate to call and ask to speak to one of the nurses for clinical concerns.  If you have a medical emergency, go to the nearest  emergency room or call 911.  A surgeon from [facility] is always on call at the hospital. ° °For further questions, please visit [email or web address]  ° ° °  Post Anesthesia Home Care Instructions ° °Activity: °Get plenty of rest for the remainder of the day. A responsible adult should stay with you for 24 hours following the procedure.  °For the next 24 hours, DO NOT: °-Drive a car °-Operate machinery °-Drink alcoholic beverages °-Take any medication unless instructed by your physician °-Make any legal decisions or sign important papers. ° °Meals: °Start with liquid foods such as gelatin or soup. Progress to regular foods as tolerated. Avoid greasy, spicy, heavy foods. If nausea and/or vomiting occur, drink only clear liquids until the nausea and/or vomiting subsides. Call your physician if vomiting continues. ° °Special Instructions/Symptoms: °Your throat may feel dry or sore from the anesthesia or the breathing tube placed in your throat during surgery. If this causes discomfort, gargle with warm salt water. The discomfort should disappear within 24 hours. ° °

## 2013-11-14 NOTE — H&P (Signed)
Transplants    None    Demographics Janice Payne 68 year old female  [place] [address] [phone] [id number] [facility])  Comm Pref: None [place] [address] [phone] (M)   Problem ListHospitalization Problem New problems from outside sources are available for reconciliation  Non-Hospital  Nephrolithiasis  Papilloma of breast  Significant History/Details  Smoking: Never Smoker   Smokeless Tobacco: Never Used  Alcohol: No  5 open orders  Preferred Language: English  Dialysis HistoryNone   Currently admitted as of 3/25/2015Specialty CommentsEditShow AllReport2/16/15 pt signed phi for sons, Janice Payne dob 01/20/78 & Janice Payne dob 03/15/75-bbk 11-01-13 pt op sx schd 7-61-95 @ [facility] no precert required per Gigi Gin KDT#[id number]. (de,tvp)   MedicationsHospital Medications Outpatient Medications   New medications from outside sources are available for reconciliation  chlorhexidine (HIBICLENS) 4 % liquid 1 application  fentaNYL (SUBLIMAZE) injection 50-100 mcg  lactated ringers infusion  midazolam (VERSED) 2 MG/ML syrup 12 mg  midazolam (VERSED) injection 1-2 mg  vancomycin (VANCOCIN) IVPB 1000 mg/200 mL premix    Preferred Labs   None   Transplant-Related Biopsies (11 years) ** None **  Patient Blood Type (50 years)   None                                 Recent Visits (Maximum of 10 visits)Date Type Provider Description  11/14/2013 Surgery Martin Smeal A., MD   10/23/2013 Scanned Document Historical Provider, MD   10/23/2013 Scanned Document Historical Provider, MD   10/23/2013 Scanned Document Historical Provider, MD   10/23/2013 Scanned Document Historical Provider, MD   10/08/2013 Office Visit Turner Daniels., MD Papilloma of Breast (Primary Dx)         My Last Outpatient Progress NoteStatus Last Edited Encounter Date  Signed Mon Oct 08, 2013 12:07 PM EST 10/08/2013  Patient ID:  Janice Payne, female   DOB: Apr 08, 1946, 68 y.o.   MRN: [id number]    Chief Complaint   Patient presents with   .  New Evaluation       eval Rt br papilloma      HPI Janice Payne is a 68 y.o. female.  Patient sent at the request of Dr. Christene Slates of [facility] for right breast papilloma. Patient had area of mammographic abnormality right central lower breast on routine screening mammography. Core biopsy showed papilloma. She denies any history of breast pain, nipple discharge or breast mass bilaterally. HPI    Past Medical History   Diagnosis  Date   .  Allergy     .  Diabetes mellitus without complication     .  Hypertension     .  Asthma         as child   .  Pneumonia         h/o 2012   .  Chronic kidney disease         kidney stone       Past Surgical History   Procedure  Laterality  Date   .  Tubal ligation       .   mole removed from back           Family History   Problem  Relation  Age of Onset   .  Heart disease  Father        Social History  History   Substance Use Topics   .  Smoking status:  Never Smoker    .  Smokeless tobacco:  Never Used   .  Alcohol Use:  No       Allergies   Allergen  Reactions   .  Codeine  Nausea And Vomiting   .  Niaspan  [Niacin Er]  Itching   .  Penicillins  Rash       Current Outpatient Prescriptions   Medication  Sig  Dispense  Refill   .  ALPRAZolam (XANAX) 0.5 MG tablet  Take 0.5 mg by mouth at bedtime as needed for sleep.         Marland Kitchen  aspirin 81 MG tablet  Take 81 mg by mouth daily.         .  calcium-vitamin D (OSCAL WITH D) 500-200 MG-UNIT per tablet  Take 1 tablet by mouth 2 (two) times daily.         .  cetirizine (ZYRTEC) 10 MG tablet  Take 10 mg by mouth daily.         .  Cholecalciferol (VITAMIN D) 2000 UNITS tablet  Take 2,000 Units by mouth daily.         .  fenofibrate 160 MG tablet  Take 160 mg by mouth daily before breakfast.         .  glimepiride (AMARYL) 2 MG tablet           .   losartan (COZAAR) 50 MG tablet  Take 50 mg by mouth daily before breakfast.         .  metFORMIN (GLUCOPHAGE-XR) 500 MG 24 hr tablet  Take 500 mg by mouth 2 (two) times daily.         .  metoprolol succinate (TOPROL-XL) 50 MG 24 hr tablet  Take 50 mg by mouth daily before breakfast. Take with or immediately following a meal.         .  pravastatin (PRAVACHOL) 20 MG tablet  Take 20 mg by mouth at bedtime.          .  sertraline (ZOLOFT) 50 MG tablet  Take 50 mg by mouth at bedtime.              No current facility-administered medications for this visit.      Review of Systems Review of Systems  Constitutional: Negative for fever, chills and unexpected weight change.  HENT: Negative for congestion, hearing loss, sore throat, trouble swallowing and voice change.   Eyes: Negative for visual disturbance.  Respiratory: Negative for cough and wheezing.   Cardiovascular: Negative for chest pain, palpitations and leg swelling.  Gastrointestinal: Negative for nausea, vomiting, abdominal pain, diarrhea, constipation, blood in stool, abdominal distention and anal bleeding.  Genitourinary: Negative for hematuria, vaginal bleeding and difficulty urinating.  Musculoskeletal: Negative for arthralgias.  Skin: Negative for rash and wound.  Neurological: Negative for seizures, syncope and headaches.  Hematological: Negative for adenopathy. Does not bruise/bleed easily.  Psychiatric/Behavioral: Negative for confusion.    Blood pressure 132/80, pulse 88, temperature 98.3 F (36.8 C), temperature source Oral, resp. rate 14, height 5' 5.5" (1.664 m), weight 213 lb 12.8 oz (96.979 kg).   Physical Exam Physical Exam  Constitutional: She is oriented to person, place, and time. She appears well-developed and well-nourished.  HENT:   Head: Normocephalic and atraumatic.  Eyes: Pupils are equal, round, and reactive to light. No scleral icterus.  Neck: Normal range of motion.  Cardiovascular: Normal rate and  regular rhythm.   Pulmonary/Chest: Effort normal and breath sounds normal. Right breast exhibits no inverted nipple, no mass, no nipple discharge, no skin change and no tenderness. Left breast exhibits no nipple discharge, no skin change and no tenderness. Breasts are symmetrical.    Musculoskeletal: Normal range of motion.  Neurological: She is alert and oriented to person, place, and time.  Skin: Skin is warm and dry.  Psychiatric: She has a normal mood and affect. Her behavior is normal. Judgment and thought content normal.    Data Reviewed Mammogram [facility] and pathology   papilloma   Assessment Right breast papilloma core biopsy   Plan Recommended excision of right breast papilloma needle localized. Risk of malignancy is 2-3%.The procedure has been discussed with the patient. Alternatives to surgery have been discussed with the patient.  Risks of surgery include bleeding,  Infection,  Seroma formation, death,  and the need for further surgery.   The patient understands and wishes to proceed.       Shakthi Scipio A. 11/14/2013

## 2013-11-14 NOTE — Transfer of Care (Signed)
Immediate Anesthesia Transfer of Care Note  Patient: Janice Payne  Procedure(s) Performed: Procedure(s): RIGHT BREAST NEEDLE LOCALIZATION (Right)  Patient Location: PACU  Anesthesia Type:General  Level of Consciousness: awake, alert  and oriented  Airway & Oxygen Therapy: Patient Spontanous Breathing and Patient connected to face mask oxygen  Post-op Assessment: Report given to PACU RN, Post -op Vital signs reviewed and stable and Patient moving all extremities  Post vital signs: Reviewed and stable  Complications: No apparent anesthesia complications

## 2013-11-14 NOTE — Anesthesia Procedure Notes (Signed)
Procedure Name: LMA Insertion Date/Time: 11/14/2013 1:23 PM Performed by: Baxter Flattery Pre-anesthesia Checklist: Patient identified, Emergency Drugs available, Suction available and Patient being monitored Patient Re-evaluated:Patient Re-evaluated prior to inductionOxygen Delivery Method: Circle System Utilized Preoxygenation: Pre-oxygenation with 100% oxygen Intubation Type: IV induction Ventilation: Mask ventilation without difficulty LMA: LMA inserted LMA Size: 4.0 Number of attempts: 1 Airway Equipment and Method: bite block Placement Confirmation: positive ETCO2 and breath sounds checked- equal and bilateral Tube secured with: Tape Dental Injury: Teeth and Oropharynx as per pre-operative assessment

## 2013-11-14 NOTE — Interval H&P Note (Signed)
History and Physical Interval Note:  11/14/2013 12:55 PM  Janice Payne  has presented today for surgery, with the diagnosis of right breast papilloma  The various methods of treatment have been discussed with the patient and family. After consideration of risks, benefits and other options for treatment, the patient has consented to  Procedure(s): RIGHT BREAST NEEDLE LOCALIZATION (Right) as a surgical intervention .  The patient's history has been reviewed, patient examined, no change in status, stable for surgery.  I have reviewed the patient's chart and labs.  Questions were answered to the patient's satisfaction.     Ulysse Siemen A.

## 2013-11-14 NOTE — Anesthesia Postprocedure Evaluation (Signed)
Anesthesia Post Note  Patient: Janice Payne  Procedure(s) Performed: Procedure(s) (LRB): RIGHT BREAST NEEDLE LOCALIZATION (Right)  Anesthesia type: general  Patient location: PACU  Post pain: Pain level controlled  Post assessment: Patient's Cardiovascular Status Stable  Last Vitals:  Filed Vitals:   11/14/13 1440  BP:   Pulse: 88  Temp:   Resp: 16    Post vital signs: Reviewed and stable  Level of consciousness: sedated  Complications: No apparent anesthesia complications

## 2013-11-14 NOTE — Op Note (Signed)
Janice Payne  1945-12-15  [id number]  11/14/2013   Preoperative diagnosis: papilloma right breast  Postoperative diagnosis: same  Procedure: needle localized right breast lumpectomy  Surgeon: Erroll Luna, MD, FACS  Anesthesia: General and 0.25 % bupivivaine with epinephrine  Clinical History and Indications: this patient presents for a guidewire localized excision of a papilloma of the right breast .The procedure has been discussed with the patient. Alternatives to surgery have been discussed with the patient.  Risks of surgery include bleeding,  Infection,  Seroma formation, death,  and the need for further surgery.   The patient understands and wishes to proceed.  Description of procedure: The patient was seen in the holding area and the plans for the procedure reviewed. The right  breast was marked as the operative side. The wire localizing films were reviewed.  The patient was taken to the operating room and after satisfactory general anesthesia had been obtained the right breast was prepped and draped and the timeout was performed.  The incision was made over the presumed area of the mass. Skin flaps were raised and using cautery the area was completely excised. Bleeders were controlled with either cautery or sutures as needed. All tissue around the localizing wire was excised. Gross margins were negative. Radiograph revealed the wire and clip to be in the specimen. After achieving hemostasis, the incision was closed with 3-0 Vicryl, 4-0 Monocryl subcuticular, and Dermabond.  The patient tolerated the procedure well. There were no operative complications. All counts were correct.   EBL: Minimal  Erroll Luna, MD, FACS 11/14/2013 1:54 PM

## 2013-11-15 ENCOUNTER — Encounter (HOSPITAL_BASED_OUTPATIENT_CLINIC_OR_DEPARTMENT_OTHER): Payer: Self-pay | Admitting: Surgery

## 2013-11-15 ENCOUNTER — Telehealth (INDEPENDENT_AMBULATORY_CARE_PROVIDER_SITE_OTHER): Payer: Self-pay | Admitting: General Surgery

## 2013-11-15 ENCOUNTER — Other Ambulatory Visit (INDEPENDENT_AMBULATORY_CARE_PROVIDER_SITE_OTHER): Payer: Self-pay | Admitting: General Surgery

## 2013-11-15 NOTE — Telephone Encounter (Signed)
Has allergy to codeine ? Can give hydrocodone according to policy.

## 2013-11-15 NOTE — Telephone Encounter (Signed)
Pt called to report she took the Tramadol last night for pain, but it made her jittery and nervous, keeping her awake all night.  Also poor pain relief with it.  Asking for hydrocodone instead.  Please advise.

## 2013-11-15 NOTE — Telephone Encounter (Signed)
Per Dr. Brantley Stage, Rx written for Norco 5/325 mg, # 30 (thirty, ) 1-2 po Q4-6H prn pain, no refill.  Will ask Dr. Redmond Pulling (Urgent office) to sign and pt can pick up at the front desk.

## 2013-11-19 ENCOUNTER — Telehealth (INDEPENDENT_AMBULATORY_CARE_PROVIDER_SITE_OTHER): Payer: Self-pay

## 2013-11-19 NOTE — Telephone Encounter (Signed)
PATIENT REQUESTING PATH RESULT ; Advised Benign fibrocystic change, Margins not involved per path results;

## 2013-12-07 ENCOUNTER — Encounter (INDEPENDENT_AMBULATORY_CARE_PROVIDER_SITE_OTHER): Payer: Self-pay

## 2013-12-07 ENCOUNTER — Encounter (INDEPENDENT_AMBULATORY_CARE_PROVIDER_SITE_OTHER): Payer: Self-pay | Admitting: Surgery

## 2013-12-07 ENCOUNTER — Ambulatory Visit (INDEPENDENT_AMBULATORY_CARE_PROVIDER_SITE_OTHER): Payer: Medicare Other | Admitting: Surgery

## 2013-12-07 VITALS — BP 128/86 | HR 86 | Temp 98.5°F | Resp 16 | Ht 65.0 in | Wt 211.0 lb

## 2013-12-07 DIAGNOSIS — Z9889 Other specified postprocedural states: Secondary | ICD-10-CM

## 2013-12-07 NOTE — Progress Notes (Signed)
Returns after right breast lumpectomy from 11/15/2013. She is doing well.  Exam: Right breast incision clean dry and intact without signs of hematoma, infection or disruption  Pathology:Breast, lumpectomy, Right - INTRADUCTAL PAPILLOMA. - MARGINS NOT INVOLVED. - BENIGN FIBROCYSTIC CHANGES.  Impression: Status post right breast lumpectomy for papilloma without atypia  Plan: Return as needed. Resume full activity. Resume yearly mammogram next year.

## 2013-12-07 NOTE — Patient Instructions (Signed)
Return as needed.  Resume full activity. 

## 2013-12-28 ENCOUNTER — Encounter (INDEPENDENT_AMBULATORY_CARE_PROVIDER_SITE_OTHER): Payer: Self-pay

## 2014-01-30 ENCOUNTER — Ambulatory Visit (INDEPENDENT_AMBULATORY_CARE_PROVIDER_SITE_OTHER): Payer: Medicare Other | Admitting: Emergency Medicine

## 2014-01-30 ENCOUNTER — Ambulatory Visit (INDEPENDENT_AMBULATORY_CARE_PROVIDER_SITE_OTHER): Payer: Medicare Other

## 2014-01-30 ENCOUNTER — Ambulatory Visit
Admission: RE | Admit: 2014-01-30 | Discharge: 2014-01-30 | Disposition: A | Discharge status: Home / Self Care | Payer: Medicare Other | Source: Ambulatory Visit | Attending: Emergency Medicine | Admitting: Emergency Medicine

## 2014-01-30 VITALS — BP 125/80 | HR 93 | Temp 98.3°F | Resp 18 | Ht 65.0 in | Wt 213.8 lb

## 2014-01-30 DIAGNOSIS — M549 Dorsalgia, unspecified: Secondary | ICD-10-CM

## 2014-01-30 DIAGNOSIS — Z87442 Personal history of urinary calculi: Secondary | ICD-10-CM

## 2014-01-30 LAB — POCT URINALYSIS DIPSTICK
BILIRUBIN UA: NEGATIVE
GLUCOSE UA: NEGATIVE
KETONES UA: NEGATIVE
Nitrite, UA: NEGATIVE
PH UA: 6.5
Protein, UA: 30
RBC UA: NEGATIVE
SPEC GRAV UA: 1.025
Urobilinogen, UA: 1

## 2014-01-30 LAB — POCT CBC
Granulocyte percent: 67.3 %G (ref 37–80)
HCT, POC: 40.8 % (ref 37.7–47.9)
Hemoglobin: 13.1 g/dL (ref 12.2–16.2)
LYMPH, POC: 1.6 (ref 0.6–3.4)
MCH, POC: 29 pg (ref 27–31.2)
MCHC: 32.1 g/dL (ref 31.8–35.4)
MCV: 90.5 fL (ref 80–97)
MID (cbc): 0.5 (ref 0–0.9)
MPV: 8.6 fL (ref 0–99.8)
POC GRANULOCYTE: 4.4 (ref 2–6.9)
POC LYMPH PERCENT: 24.5 %L (ref 10–50)
POC MID %: 8.2 % (ref 0–12)
Platelet Count, POC: 203 10*3/uL (ref 142–424)
RBC: 4.51 M/uL (ref 4.04–5.48)
RDW, POC: 14 %
WBC: 6.6 10*3/uL (ref 4.6–10.2)

## 2014-01-30 LAB — POCT UA - MICROSCOPIC ONLY
Casts, Ur, LPF, POC: NEGATIVE
Crystals, Ur, HPF, POC: NEGATIVE
MUCUS UA: NEGATIVE
RBC, URINE, MICROSCOPIC: NEGATIVE
Yeast, UA: NEGATIVE

## 2014-01-30 MED ORDER — AZITHROMYCIN 250 MG PO TABS: ORAL_TABLET | ORAL | Status: DC

## 2014-01-30 MED ORDER — HYDROCODONE-ACETAMINOPHEN 5-325 MG PO TABS: 1.0000 | ORAL_TABLET | Freq: Four times a day (QID) | ORAL | Status: DC | PRN

## 2014-01-30 NOTE — Progress Notes (Addendum)
   Subjective:    Patient ID: Dayna Ramus, female    DOB: 12/06/1945, 68 y.o.   MRN: [id number]  HPI 68 y.o. Female presents to clinic today with back pain. Reports history of kidney stones. Denies any urinary frequency. Reports that back feels similar now to how it felt when she had a kidney stone before. States that she took a half of a hydrocodone last night to help with the pain.   Review of Systems     Objective:   Physical Exam chest exam reveals a few dry rales in the bases. Heart has a regular rate without murmurs. The abdomen is soft liver spleen not large there are no areas of tenderness there is also no CVA tenderness. [facility] reading (PRIMARY) by  Dr.Daub no stones seen Results for orders placed in visit on 01/30/14  POCT CBC      Result Value Ref Range   WBC 6.6  4.6 - 10.2 K/uL   Lymph, poc 1.6  0.6 - 3.4   POC LYMPH PERCENT 24.5  10 - 50 %L   MID (cbc) 0.5  0 - 0.9   POC MID % 8.2  0 - 12 %M   POC Granulocyte 4.4  2 - 6.9   Granulocyte percent 67.3  37 - 80 %G   RBC 4.51  4.04 - 5.48 M/uL   Hemoglobin 13.1  12.2 - 16.2 g/dL   HCT, POC 40.8  37.7 - 47.9 %   MCV 90.5  80 - 97 fL   MCH, POC 29.0  27 - 31.2 pg   MCHC 32.1  31.8 - 35.4 g/dL   RDW, POC 14.0     Platelet Count, POC 203  142 - 424 K/uL   MPV 8.6  0 - 99.8 fL  POCT UA - MICROSCOPIC ONLY      Result Value Ref Range   WBC, Ur, HPF, POC 0-6     RBC, urine, microscopic neg     Bacteria, U Microscopic trace     Mucus, UA neg     Epithelial cells, urine per micros 1-4     Crystals, Ur, HPF, POC neg     Casts, Ur, LPF, POC neg     Yeast, UA neg    POCT URINALYSIS DIPSTICK      Result Value Ref Range   Color, UA yellow     Clarity, UA clear     Glucose, UA neg     Bilirubin, UA neg     Ketones, UA neg     Spec Grav, UA 1.025     Blood, UA neg     pH, UA 6.5     Protein, UA 30     Urobilinogen, UA 1.0     Nitrite, UA neg     Leukocytes, UA Trace           Assessment & Plan:  Will proceed  with CT urogram. She will be given a strainer and medications for pain relief. CT scan was done and showed no stone. There was a patchy infiltrate in the right base. We'll go ahead and cover with a Z-Pak for atypical pneumonia.

## 2014-01-30 NOTE — Addendum Note (Signed)
Addended by: Arlyss Queen A on: 01/30/2014 04:50 PM   Modules accepted: Orders

## 2014-02-01 LAB — URINE CULTURE: Colony Count: 4000

## 2014-02-27 ENCOUNTER — Other Ambulatory Visit: Payer: Self-pay | Admitting: Nurse Practitioner

## 2014-02-27 ENCOUNTER — Ambulatory Visit
Admission: RE | Admit: 2014-02-27 | Discharge: 2014-02-27 | Disposition: A | Discharge status: Home / Self Care | Payer: Medicare Other | Source: Ambulatory Visit | Attending: Nurse Practitioner | Admitting: Nurse Practitioner

## 2014-02-27 DIAGNOSIS — J189 Pneumonia, unspecified organism: Secondary | ICD-10-CM

## 2014-02-27 DIAGNOSIS — J181 Lobar pneumonia, unspecified organism: Principal | ICD-10-CM

## 2014-10-02 DIAGNOSIS — J309 Allergic rhinitis, unspecified: Secondary | ICD-10-CM | POA: Insufficient documentation

## 2015-02-13 ENCOUNTER — Encounter: Payer: Self-pay | Admitting: *Deleted

## 2015-04-15 DIAGNOSIS — R011 Cardiac murmur, unspecified: Secondary | ICD-10-CM | POA: Insufficient documentation

## 2016-01-21 DIAGNOSIS — R519 Headache, unspecified: Secondary | ICD-10-CM | POA: Insufficient documentation

## 2018-10-25 DIAGNOSIS — K2100 Gastro-esophageal reflux disease with esophagitis, without bleeding: Secondary | ICD-10-CM | POA: Insufficient documentation

## 2018-12-06 DIAGNOSIS — E1343 Other specified diabetes mellitus with diabetic autonomic (poly)neuropathy: Secondary | ICD-10-CM | POA: Insufficient documentation

## 2019-05-24 ENCOUNTER — Other Ambulatory Visit: Payer: Self-pay | Admitting: Nephrology

## 2019-05-25 ENCOUNTER — Ambulatory Visit
Admission: RE | Admit: 2019-05-25 | Discharge: 2019-05-25 | Disposition: A | Discharge status: Home / Self Care | Payer: Medicare Other | Source: Ambulatory Visit | Attending: Nephrology | Admitting: Nephrology

## 2019-05-25 ENCOUNTER — Other Ambulatory Visit: Payer: Self-pay | Admitting: Nephrology

## 2019-05-25 DIAGNOSIS — N183 Chronic kidney disease, stage 3 unspecified: Secondary | ICD-10-CM

## 2019-10-19 ENCOUNTER — Ambulatory Visit: Payer: Medicare Other | Attending: Internal Medicine

## 2019-10-19 DIAGNOSIS — Z23 Encounter for immunization: Secondary | ICD-10-CM

## 2019-10-19 NOTE — Progress Notes (Signed)
   Covid-19 Vaccination Clinic  Name:  Janice Payne    MRN: [id number] DOB: Apr 16, 1946  10/19/2019  Ms. Janice Payne was observed post Covid-19 immunization for 15 minutes without incidence. She was provided with Vaccine Information Sheet and instruction to access the V-Safe system.   Ms. Janice Payne was instructed to call 911 with any severe reactions post vaccine: Marland Kitchen Difficulty breathing  . Swelling of your face and throat  . A fast heartbeat  . A bad rash all over your body  . Dizziness and weakness    Immunizations Administered    Name Date Dose VIS Date Route   Pfizer COVID-19 Vaccine 10/19/2019  3:46 PM 0.3 mL 08/03/2019 Intramuscular   Manufacturer: [place]   Lot: [id number]   [place]: [id number]

## 2019-11-14 ENCOUNTER — Ambulatory Visit: Payer: Medicare Other | Attending: Internal Medicine

## 2019-11-14 DIAGNOSIS — Z23 Encounter for immunization: Secondary | ICD-10-CM

## 2019-11-14 NOTE — Progress Notes (Signed)
   Covid-19 Vaccination Clinic  Name:  Janice Payne    MRN: [id number] DOB: [birth year]  11/14/2019  [name] was observed post Covid-19 immunization for 15 minutes without incident. She was provided with Vaccine Information Sheet and instruction to access the V-Safe system.   [name] was instructed to call 911 with any severe reactions post vaccine: Marland Kitchen Difficulty breathing  . Swelling of face and throat  . A fast heartbeat  . A bad rash all over body  . Dizziness and weakness   Immunizations Administered    Name Date Dose VIS Date Route   Pfizer COVID-19 Vaccine 11/14/2019  8:34 AM 0.3 mL 08/03/2019 Intramuscular   Manufacturer: [place]   Lot: [id number]   [place]: [id number]

## 2020-11-12 ENCOUNTER — Other Ambulatory Visit: Payer: Self-pay | Admitting: Gastroenterology

## 2020-11-12 DIAGNOSIS — K5720 Diverticulitis of large intestine with perforation and abscess without bleeding: Secondary | ICD-10-CM

## 2020-11-27 ENCOUNTER — Inpatient Hospital Stay: Admission: RE | Admit: 2020-11-27 | Payer: Medicare Other | Source: Ambulatory Visit

## 2021-01-02 ENCOUNTER — Other Ambulatory Visit: Payer: Self-pay

## 2021-01-02 ENCOUNTER — Emergency Department (HOSPITAL_BASED_OUTPATIENT_CLINIC_OR_DEPARTMENT_OTHER)
Admission: EM | Admit: 2021-01-02 | Discharge: 2021-01-02 | Disposition: A | Discharge status: Home / Self Care | Payer: Medicare Other | Attending: Emergency Medicine | Admitting: Emergency Medicine

## 2021-01-02 ENCOUNTER — Encounter (HOSPITAL_BASED_OUTPATIENT_CLINIC_OR_DEPARTMENT_OTHER): Payer: Self-pay | Admitting: Emergency Medicine

## 2021-01-02 ENCOUNTER — Emergency Department (HOSPITAL_BASED_OUTPATIENT_CLINIC_OR_DEPARTMENT_OTHER): Payer: Medicare Other | Admitting: Radiology

## 2021-01-02 DIAGNOSIS — E119 Type 2 diabetes mellitus without complications: Secondary | ICD-10-CM | POA: Insufficient documentation

## 2021-01-02 DIAGNOSIS — Z7982 Long term (current) use of aspirin: Secondary | ICD-10-CM | POA: Diagnosis not present

## 2021-01-02 DIAGNOSIS — U071 COVID-19: Secondary | ICD-10-CM | POA: Insufficient documentation

## 2021-01-02 DIAGNOSIS — I10 Essential (primary) hypertension: Secondary | ICD-10-CM | POA: Diagnosis not present

## 2021-01-02 DIAGNOSIS — Z7984 Long term (current) use of oral hypoglycemic drugs: Secondary | ICD-10-CM | POA: Diagnosis not present

## 2021-01-02 DIAGNOSIS — Z79899 Other long term (current) drug therapy: Secondary | ICD-10-CM | POA: Diagnosis not present

## 2021-01-02 DIAGNOSIS — R509 Fever, unspecified: Secondary | ICD-10-CM | POA: Diagnosis present

## 2021-01-02 DIAGNOSIS — J45909 Unspecified asthma, uncomplicated: Secondary | ICD-10-CM | POA: Diagnosis not present

## 2021-01-02 LAB — RESP PANEL BY RT-PCR (FLU A&B, COVID) ARPGX2
Influenza A by PCR: NEGATIVE
Influenza B by PCR: NEGATIVE
SARS Coronavirus 2 by RT PCR: POSITIVE — AB

## 2021-01-02 MED ORDER — NIRMATRELVIR/RITONAVIR (PAXLOVID)TABLET: 2.0000 | ORAL_TABLET | Freq: Two times a day (BID) | ORAL | 0 refills | Status: AC

## 2021-01-02 NOTE — Discharge Instructions (Addendum)
Stop taking your Xanax and Pravachol while you are taking the COVID medication.  Return to emergency room if you have any worsening symptoms including shortness of breath, ongoing vomiting or other worsening symptoms.

## 2021-01-02 NOTE — ED Triage Notes (Signed)
Pt arrives to ED with c/o of cough and fever since Monday. Pt states fevers come and then go after Tylenol. Pt states that she has been congested with a runny noise since Monday. Pt has productive cough with white tinged phlegm. Has had x2 negative COVID test, pt fears pneumonia. No N/V/D, shortness of breath, chest pain, or headache.

## 2021-01-02 NOTE — ED Notes (Signed)
MD Belfi made aware of COVID-19 Positive Results. No New Orders at this Time.

## 2021-01-02 NOTE — ED Provider Notes (Signed)
[facility] EMERGENCY DEPT Provider Note   CSN: [id number] Arrival date & time: 01/02/21  1738     History Chief Complaint  Patient presents with  . Cough  . Fever    Janice Payne is a 75 y.o. female.  Patient is a 75 year old female who presents with cough and fever.  She has had a 2-day history of cough with associated fever up to 99.  Her cough is productive of clear white sputum.  She has had a little bit of rhinorrhea.  She feels fatigued.  She has no shortness of breath.  No chest pain.  No vomiting.  She has had some loose stools but no overt diarrhea.  She has been vaccinated for COVID.  She took 2 negative test at home for COVID.        Past Medical History:  Diagnosis Date  . Arthritis    left hip, neck  . Dental crown present   . Dry mouth   . GERD (gastroesophageal reflux disease)   . History of asthma    no current problems or med.  Marland Kitchen History of kidney stones   . Hyperlipidemia   . Hypertension    under control with meds.  . Non-insulin dependent type 2 diabetes mellitus ([place])   . 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 Seasonal allergies   . Sinus headache     Patient Active Problem List   Diagnosis Date Noted  . Post-operative state 12/07/2013  . Papilloma of breast 10/08/2013  . Nephrolithiasis 11/03/2012    Past Surgical History:  Procedure Laterality Date  . BREAST BIOPSY Right 11/14/2013   Procedure: RIGHT BREAST NEEDLE LOCALIZATION;  Surgeon: Marcello Moores A. Cornett, MD;  Location: [facility];  Service: General;  Laterality: Right;  . EXTRACORPOREAL SHOCK WAVE LITHOTRIPSY  11/27/2012  . TUBAL LIGATION       OB History   No obstetric history on file.     Family History  Problem Relation Age of Onset  . Heart disease Father     Social History   Tobacco Use  . Smoking status: Never Smoker  . Smokeless tobacco: Never Used  Substance Use Topics  . Alcohol use: No  . Drug use: No    Home Medications Prior  to Admission medications   Medication Sig Start Date End Date Taking? Authorizing Provider  ALPRAZolam Duanne Moron) 0.5 MG tablet Take 0.5 mg by mouth at bedtime as needed for sleep.   Yes [provider]  aspirin 81 MG tablet Take 81 mg by mouth daily.   Yes [provider]  cetirizine (ZYRTEC) 10 MG tablet Take 10 mg by mouth daily.   Yes [provider]  Cholecalciferol (VITAMIN D) 2000 UNITS tablet Take 2,000 Units by mouth daily.   Yes [provider]  fenofibrate 160 MG tablet Take 160 mg by mouth daily before breakfast.   Yes [provider]  metoprolol succinate (TOPROL-XL) 50 MG 24 hr tablet Take 50 mg by mouth daily before breakfast. Take with or immediately following a meal.   Yes [provider]  nirmatrelvir/ritonavir EUA (PAXLOVID) TABS Take 2 tablets by mouth 2 (two) times daily for 5 days. Patient GFR is 31. Take nirmatrelvir (150 mg) 1 tablet(s) twice daily for 5 days and ritonavir (100 mg) one tablet twice daily for 5 days. 01/02/21 01/07/21 Yes Malvin Johns, MD  pravastatin (PRAVACHOL) 20 MG tablet Take 20 mg by mouth at bedtime.    Yes [provider]  sertraline (ZOLOFT) 50 MG tablet Take 50 mg by mouth at bedtime.    Yes [provider]  azithromycin (ZITHROMAX) 250 MG tablet Take 2 tabs PO x 1 dose, then 1 tab PO QD x 4 days 01/30/14   Darlyne Russian, MD  calcium-vitamin D (OSCAL WITH D) 500-200 MG-UNIT per tablet Take 1 tablet by mouth 2 (two) times daily.    [provider]  clotrimazole-betamethasone (LOTRISONE) cream Apply 1 application topically 2 (two) times daily.    [provider]  glimepiride (AMARYL) 2 MG tablet Take 4 mg by mouth daily with breakfast.  10/02/13   [provider]  HYDROcodone-acetaminophen (NORCO/VICODIN) 5-325 MG per tablet Take 1 tablet by mouth every 6 (six) hours as needed for moderate pain. 01/30/14   Darlyne Russian, MD  losartan (COZAAR) 50 MG tablet  Take 50 mg by mouth daily before breakfast.    [provider]  metFORMIN (GLUCOPHAGE-XR) 500 MG 24 hr tablet Take 1,000 mg by mouth daily with breakfast.     [provider]  Omeprazole-Sodium Bicarbonate (ZEGERID) 20-1100 MG CAPS capsule Take 1 capsule by mouth daily before breakfast.    [provider]  traMADol (ULTRAM) 50 MG tablet Take 1 tablet (50 mg total) by mouth every 6 (six) hours as needed. 11/14/13   Cornett, Marcello Moores, MD    Allergies    Codeine, Niaspan [niacin er], and Penicillins  Review of Systems   Review of Systems  Constitutional: Positive for fatigue and fever. Negative for chills and diaphoresis.  HENT: Positive for rhinorrhea. Negative for congestion and sneezing.   Eyes: Negative.   Respiratory: Positive for cough. Negative for chest tightness and shortness of breath.   Cardiovascular: Negative for chest pain and leg swelling.  Gastrointestinal: Negative for abdominal pain, blood in stool, diarrhea, nausea and vomiting.  Genitourinary: Negative for difficulty urinating, flank pain, frequency and hematuria.  Musculoskeletal: Negative for arthralgias and back pain.  Skin: Negative for rash.  Neurological: Negative for dizziness, speech difficulty, weakness, numbness and headaches.    Physical Exam Updated Vital Signs BP 120/62   Pulse 69   Temp 99.7 F (37.6 C) (Oral)   Resp 18   Ht 5\' 5"  (1.651 m)   Wt 107.5 kg   SpO2 99%   BMI 39.44 kg/m   Physical Exam Constitutional:      Appearance: She is well-developed.  HENT:     Head: Normocephalic and atraumatic.  Eyes:     Pupils: Pupils are equal, round, and reactive to light.  Cardiovascular:     Rate and Rhythm: Normal rate and regular rhythm.     Heart sounds: Normal heart sounds.  Pulmonary:     Effort: Pulmonary effort is normal. No respiratory distress.     Breath sounds: Normal breath sounds. No wheezing or rales.  Chest:     Chest wall: No tenderness.  Abdominal:      General: Bowel sounds are normal.     Palpations: Abdomen is soft.     Tenderness: There is no abdominal tenderness. There is no guarding or rebound.  Musculoskeletal:        General: Normal range of motion.     Cervical back: Normal range of motion and neck supple.     Right lower leg: No edema.     Left lower leg: No edema.  Lymphadenopathy:     Cervical: No cervical adenopathy.  Skin:    General: Skin is warm and  dry.     Findings: No rash.  Neurological:     Mental Status: She is alert and oriented to person, place, and time.     ED Results / Procedures / Treatments   Labs (all labs ordered are listed, but only abnormal results are displayed) Labs Reviewed  RESP PANEL BY RT-PCR (FLU A&B, COVID) ARPGX2 - Abnormal; Notable for the following components:      Result Value   SARS Coronavirus 2 by RT PCR POSITIVE (*)    All other components within normal limits    EKG None  Radiology DG Chest 2 View  Result Date: 01/02/2021 CLINICAL DATA:  Cough and fever EXAM: CHEST - 2 VIEW COMPARISON:  February 27, 2014 FINDINGS: Lungs are clear. Heart size and pulmonary vascularity are normal. No adenopathy. There is aortic atherosclerosis. No bone lesions. IMPRESSION: Lungs clear. Cardiac silhouette normal. Aortic Atherosclerosis (ICD10-I70.0). Electronically Signed   By: Lowella Grip III M.D.   On: 01/02/2021 19:23    Procedures Procedures   Medications Ordered in ED Medications - No data to display  ED Course  I have reviewed the triage vital signs and the nursing notes.  Pertinent labs & imaging results that were available during my care of the patient were reviewed by me and considered in my medical decision making (see chart for details).    MDM Rules/Calculators/A&P                          Patient presents with cough and low-grade fevers.  She has no shortness of breath.  Her COVID test is positive.  Her chest x-ray is clear without evidence of pneumonia.  She has no  hypoxia.  She was discharged home in good condition.  She was started on Paxlovid at a renal adjusted dose for her GFR of 31.  She was advised to stop her Xanax and Pravachol while she is on the medication.  Symptomatic care instructions and quarantine precautions were given.  Return precautions were given. Final Clinical Impression(s) / ED Diagnoses Final diagnoses:  [id number]    Rx / DC Orders ED Discharge Orders         Ordered    nirmatrelvir/ritonavir EUA (PAXLOVID) TABS  2 times daily        01/02/21 2112           Malvin Johns, MD 01/02/21 2259

## 2021-03-06 ENCOUNTER — Other Ambulatory Visit: Payer: Self-pay

## 2021-03-06 ENCOUNTER — Ambulatory Visit: Payer: Medicare Other | Admitting: Podiatry

## 2021-03-06 ENCOUNTER — Other Ambulatory Visit: Payer: Self-pay | Admitting: Podiatry

## 2021-03-06 ENCOUNTER — Ambulatory Visit (INDEPENDENT_AMBULATORY_CARE_PROVIDER_SITE_OTHER): Payer: Medicare Other

## 2021-03-06 DIAGNOSIS — M79672 Pain in left foot: Secondary | ICD-10-CM

## 2021-03-06 DIAGNOSIS — M7661 Achilles tendinitis, right leg: Secondary | ICD-10-CM | POA: Diagnosis not present

## 2021-03-06 DIAGNOSIS — M7742 Metatarsalgia, left foot: Secondary | ICD-10-CM | POA: Diagnosis not present

## 2021-03-06 DIAGNOSIS — M79671 Pain in right foot: Secondary | ICD-10-CM

## 2021-03-06 NOTE — Progress Notes (Signed)
  Subjective:  Patient ID: Janice Payne, female    DOB: May 21, 1946,  MRN: [id number]  Chief Complaint  Patient presents with   Foot Pain    Right achilles pain Left generalized pain 1 month duration no known injuries    75 y.o. female presents with the above complaint. History confirmed with patient. Previously diagnosed with neuropathy. Pain on right is at back of heel and radiating up leg, left foot pain generalized hard to pinpoint to a particular location.  Objective:  Physical Exam: warm, good capillary refill, no trophic changes or ulcerative lesions, normal DP and PT pulses, and normal sensory exam. Left Foot: POP left 4th met head  Right Foot: POP posterior Achilles, decreased AJ rom, + Silvershoild test, prominent posterior spur   No images are attached to the encounter.  Radiographs: X-ray of the left foot: plantar calcaneal spur, posterior calcaneal spur, Haglund deformity noted, and digital contractures Assessment:   1. Tendonitis, Achilles, right   2. Metatarsalgia of left foot   3. Pain in both feet      Plan:  Patient was evaluated and treated and all questions answered.  Achilles Tendonitis -XR reviewed with patient -Educated on stretching and icing of the affected limb. -Rest in CAM boot, then will plan aggressive stretching regimen -Unable to tolerate NSAIDs given CKD  Metatarsalgia left -Dispense metatarsal padding.   Return in about 1 month (around 04/06/2021) for Tendonitis.

## 2021-03-16 ENCOUNTER — Emergency Department (HOSPITAL_COMMUNITY): Payer: Medicare Other

## 2021-03-16 ENCOUNTER — Ambulatory Visit
Admission: RE | Admit: 2021-03-16 | Discharge: 2021-03-16 | Disposition: A | Discharge status: Home / Self Care | Payer: Medicare Other | Source: Ambulatory Visit | Attending: Gastroenterology | Admitting: Gastroenterology

## 2021-03-16 ENCOUNTER — Encounter (HOSPITAL_COMMUNITY): Payer: Self-pay | Admitting: Emergency Medicine

## 2021-03-16 ENCOUNTER — Encounter (HOSPITAL_COMMUNITY): Admission: EM | Disposition: A | Discharge status: Home / Self Care | Payer: Self-pay | Source: Home / Self Care

## 2021-03-16 ENCOUNTER — Inpatient Hospital Stay (HOSPITAL_COMMUNITY)
Admission: EM | Admit: 2021-03-16 | Discharge: 2021-03-22 | DRG: 908 | Disposition: A | Discharge status: Home / Self Care | Payer: Medicare Other | Attending: Surgery | Admitting: Surgery

## 2021-03-16 ENCOUNTER — Other Ambulatory Visit: Payer: Self-pay | Admitting: Gastroenterology

## 2021-03-16 ENCOUNTER — Other Ambulatory Visit: Payer: Self-pay

## 2021-03-16 ENCOUNTER — Emergency Department (HOSPITAL_COMMUNITY): Payer: Medicare Other | Admitting: Registered Nurse

## 2021-03-16 DIAGNOSIS — K631 Perforation of intestine (nontraumatic): Secondary | ICD-10-CM | POA: Diagnosis present

## 2021-03-16 DIAGNOSIS — M1990 Unspecified osteoarthritis, unspecified site: Secondary | ICD-10-CM | POA: Diagnosis present

## 2021-03-16 DIAGNOSIS — D72829 Elevated white blood cell count, unspecified: Secondary | ICD-10-CM | POA: Diagnosis not present

## 2021-03-16 DIAGNOSIS — I129 Hypertensive chronic kidney disease with stage 1 through stage 4 chronic kidney disease, or unspecified chronic kidney disease: Secondary | ICD-10-CM | POA: Diagnosis present

## 2021-03-16 DIAGNOSIS — Z885 Allergy status to narcotic agent status: Secondary | ICD-10-CM | POA: Diagnosis not present

## 2021-03-16 DIAGNOSIS — R109 Unspecified abdominal pain: Secondary | ICD-10-CM

## 2021-03-16 DIAGNOSIS — K219 Gastro-esophageal reflux disease without esophagitis: Secondary | ICD-10-CM | POA: Diagnosis present

## 2021-03-16 DIAGNOSIS — Z888 Allergy status to other drugs, medicaments and biological substances status: Secondary | ICD-10-CM | POA: Diagnosis not present

## 2021-03-16 DIAGNOSIS — Z8249 Family history of ischemic heart disease and other diseases of the circulatory system: Secondary | ICD-10-CM | POA: Diagnosis not present

## 2021-03-16 DIAGNOSIS — Y92239 Unspecified place in hospital as the place of occurrence of the external cause: Secondary | ICD-10-CM | POA: Diagnosis present

## 2021-03-16 DIAGNOSIS — E119 Type 2 diabetes mellitus without complications: Secondary | ICD-10-CM | POA: Diagnosis present

## 2021-03-16 DIAGNOSIS — E1122 Type 2 diabetes mellitus with diabetic chronic kidney disease: Secondary | ICD-10-CM | POA: Diagnosis present

## 2021-03-16 DIAGNOSIS — Z8616 Personal history of COVID-19: Secondary | ICD-10-CM

## 2021-03-16 DIAGNOSIS — H6011 Cellulitis of right external ear: Secondary | ICD-10-CM | POA: Diagnosis present

## 2021-03-16 DIAGNOSIS — R140 Abdominal distension (gaseous): Secondary | ICD-10-CM

## 2021-03-16 DIAGNOSIS — Z87442 Personal history of urinary calculi: Secondary | ICD-10-CM

## 2021-03-16 DIAGNOSIS — K91870 Postprocedural hematoma of a digestive system organ or structure following a digestive system procedure: Secondary | ICD-10-CM | POA: Diagnosis present

## 2021-03-16 DIAGNOSIS — K625 Hemorrhage of anus and rectum: Secondary | ICD-10-CM | POA: Diagnosis present

## 2021-03-16 DIAGNOSIS — M25511 Pain in right shoulder: Secondary | ICD-10-CM | POA: Diagnosis present

## 2021-03-16 DIAGNOSIS — Y838 Other surgical procedures as the cause of abnormal reaction of the patient, or of later complication, without mention of misadventure at the time of the procedure: Secondary | ICD-10-CM | POA: Diagnosis present

## 2021-03-16 DIAGNOSIS — E1143 Type 2 diabetes mellitus with diabetic autonomic (poly)neuropathy: Secondary | ICD-10-CM | POA: Diagnosis present

## 2021-03-16 DIAGNOSIS — T884XXS Failed or difficult intubation, sequela: Secondary | ICD-10-CM

## 2021-03-16 DIAGNOSIS — E782 Mixed hyperlipidemia: Secondary | ICD-10-CM | POA: Diagnosis present

## 2021-03-16 DIAGNOSIS — N179 Acute kidney failure, unspecified: Secondary | ICD-10-CM | POA: Diagnosis present

## 2021-03-16 DIAGNOSIS — Z9889 Other specified postprocedural states: Secondary | ICD-10-CM

## 2021-03-16 DIAGNOSIS — N183 Chronic kidney disease, stage 3 unspecified: Secondary | ICD-10-CM | POA: Diagnosis present

## 2021-03-16 DIAGNOSIS — K9171 Accidental puncture and laceration of a digestive system organ or structure during a digestive system procedure: Principal | ICD-10-CM | POA: Diagnosis present

## 2021-03-16 DIAGNOSIS — Z880 Allergy status to penicillin: Secondary | ICD-10-CM

## 2021-03-16 HISTORY — DX: Failed or difficult intubation, initial encounter: T88.4XXA

## 2021-03-16 HISTORY — PX: LAPAROTOMY: SHX154

## 2021-03-16 LAB — RESP PANEL BY RT-PCR (FLU A&B, COVID) ARPGX2
Influenza A by PCR: NEGATIVE
Influenza B by PCR: NEGATIVE
SARS Coronavirus 2 by RT PCR: POSITIVE — AB

## 2021-03-16 LAB — COMPREHENSIVE METABOLIC PANEL
ALT: 21 U/L (ref 0–44)
AST: 24 U/L (ref 15–41)
Albumin: 4 g/dL (ref 3.5–5.0)
Alkaline Phosphatase: 38 U/L (ref 38–126)
Anion gap: 14 (ref 5–15)
BUN: 15 mg/dL (ref 8–23)
CO2: 21 mmol/L — ABNORMAL LOW (ref 22–32)
Calcium: 9.5 mg/dL (ref 8.9–10.3)
Chloride: 101 mmol/L (ref 98–111)
Creatinine, Ser: 1.62 mg/dL — ABNORMAL HIGH (ref 0.44–1.00)
GFR, Estimated: 33 mL/min — ABNORMAL LOW (ref >60)
Glucose, Bld: 235 mg/dL — ABNORMAL HIGH (ref 70–99)
Potassium: 3.9 mmol/L (ref 3.5–5.1)
Sodium: 136 mmol/L (ref 135–145)
Total Bilirubin: 1 mg/dL (ref 0.3–1.2)
Total Protein: 7.2 g/dL (ref 6.5–8.1)

## 2021-03-16 LAB — CBC WITH DIFFERENTIAL/PLATELET
Abs Immature Granulocytes: 0.1 10*3/uL — ABNORMAL HIGH (ref 0.00–0.07)
Basophils Absolute: 0.1 10*3/uL (ref 0.0–0.1)
Basophils Relative: 0 %
Eosinophils Absolute: 0 10*3/uL (ref 0.0–0.5)
Eosinophils Relative: 0 %
HCT: 41.9 % (ref 36.0–46.0)
Hemoglobin: 14.2 g/dL (ref 12.0–15.0)
Immature Granulocytes: 1 %
Lymphocytes Relative: 7 %
Lymphs Abs: 0.9 10*3/uL (ref 0.7–4.0)
MCH: 29.5 pg (ref 26.0–34.0)
MCHC: 33.9 g/dL (ref 30.0–36.0)
MCV: 86.9 fL (ref 80.0–100.0)
Monocytes Absolute: 0.5 10*3/uL (ref 0.1–1.0)
Monocytes Relative: 5 %
Neutro Abs: 10.2 10*3/uL — ABNORMAL HIGH (ref 1.7–7.7)
Neutrophils Relative %: 87 %
Platelets: 179 10*3/uL (ref 150–400)
RBC: 4.82 MIL/uL (ref 3.87–5.11)
RDW: 12.7 % (ref 11.5–15.5)
WBC: 11.8 10*3/uL — ABNORMAL HIGH (ref 4.0–10.5)
nRBC: 0 % (ref 0.0–0.2)

## 2021-03-16 LAB — I-STAT CHEM 8, ED
BUN: 17 mg/dL (ref 8–23)
Calcium, Ion: 1.13 mmol/L — ABNORMAL LOW (ref 1.15–1.40)
Chloride: 104 mmol/L (ref 98–111)
Creatinine, Ser: 1.5 mg/dL — ABNORMAL HIGH (ref 0.44–1.00)
Glucose, Bld: 232 mg/dL — ABNORMAL HIGH (ref 70–99)
HCT: 43 % (ref 36.0–46.0)
Hemoglobin: 14.6 g/dL (ref 12.0–15.0)
Potassium: 3.8 mmol/L (ref 3.5–5.1)
Sodium: 137 mmol/L (ref 135–145)
TCO2: 21 mmol/L — ABNORMAL LOW (ref 22–32)

## 2021-03-16 LAB — LACTIC ACID, PLASMA
Lactic Acid, Venous: 2 mmol/L (ref 0.5–1.9)
Lactic Acid, Venous: 2 mmol/L (ref 0.5–1.9)

## 2021-03-16 LAB — TYPE AND SCREEN
ABO/RH(D): A POS
Antibody Screen: NEGATIVE

## 2021-03-16 SURGERY — LAPAROTOMY, EXPLORATORY
Anesthesia: General | Site: Abdomen

## 2021-03-16 MED ORDER — PROPOFOL 10 MG/ML IV BOLUS
INTRAVENOUS | Status: DC | PRN
  Administered 2021-03-16: 130 mg via INTRAVENOUS

## 2021-03-16 MED ORDER — SODIUM CHLORIDE 0.9 % IV SOLN
2.0000 g | Freq: Two times a day (BID) | INTRAVENOUS | Status: DC
  Administered 2021-03-16: 2 g via INTRAVENOUS
  Filled 2021-03-16: qty 2

## 2021-03-16 MED ORDER — LIDOCAINE 2% (20 MG/ML) 5 ML SYRINGE
INTRAMUSCULAR | Status: DC | PRN
  Administered 2021-03-16: 80 mg via INTRAVENOUS

## 2021-03-16 MED ORDER — LACTATED RINGERS IV SOLN: INTRAVENOUS | Status: DC | PRN

## 2021-03-16 MED ORDER — FENTANYL CITRATE (PF) 100 MCG/2ML IJ SOLN
50.0000 ug | Freq: Once | INTRAMUSCULAR | Status: AC
  Administered 2021-03-16: 50 ug via INTRAVENOUS
  Filled 2021-03-16: qty 2

## 2021-03-16 MED ORDER — FENTANYL CITRATE (PF) 250 MCG/5ML IJ SOLN
INTRAMUSCULAR | Status: AC
  Filled 2021-03-16: qty 5

## 2021-03-16 MED ORDER — PHENYLEPHRINE 40 MCG/ML (10ML) SYRINGE FOR IV PUSH (FOR BLOOD PRESSURE SUPPORT)
PREFILLED_SYRINGE | INTRAVENOUS | Status: DC | PRN
  Administered 2021-03-16: 80 ug via INTRAVENOUS

## 2021-03-16 MED ORDER — METRONIDAZOLE 500 MG/100ML IV SOLN
500.0000 mg | Freq: Three times a day (TID) | INTRAVENOUS | Status: DC
  Administered 2021-03-16: 500 mg via INTRAVENOUS
  Filled 2021-03-16: qty 100

## 2021-03-16 MED ORDER — LACTATED RINGERS IV SOLN: INTRAVENOUS | Status: DC

## 2021-03-16 MED ORDER — BUPIVACAINE-EPINEPHRINE (PF) 0.25% -1:200000 IJ SOLN
INTRAMUSCULAR | Status: AC
  Filled 2021-03-16: qty 30

## 2021-03-16 MED ORDER — DIPHENHYDRAMINE HCL 50 MG/ML IJ SOLN
INTRAMUSCULAR | Status: DC | PRN
  Administered 2021-03-16: 6.25 mg via INTRAVENOUS

## 2021-03-16 MED ORDER — ROCURONIUM BROMIDE 10 MG/ML (PF) SYRINGE
PREFILLED_SYRINGE | INTRAVENOUS | Status: DC | PRN
  Administered 2021-03-16: 60 mg via INTRAVENOUS
  Administered 2021-03-16: 20 mg via INTRAVENOUS

## 2021-03-16 MED ORDER — ONDANSETRON HCL 4 MG/2ML IJ SOLN
4.0000 mg | Freq: Once | INTRAMUSCULAR | Status: AC
  Administered 2021-03-16: 4 mg via INTRAVENOUS
  Filled 2021-03-16: qty 2

## 2021-03-16 MED ORDER — LABETALOL HCL 5 MG/ML IV SOLN
INTRAVENOUS | Status: DC | PRN
  Administered 2021-03-16: 5 mg via INTRAVENOUS
  Administered 2021-03-16: 2.5 mg via INTRAVENOUS

## 2021-03-16 MED ORDER — 0.9 % SODIUM CHLORIDE (POUR BTL) OPTIME
TOPICAL | Status: DC | PRN
  Administered 2021-03-16: 2000 mL

## 2021-03-16 MED ORDER — SUCCINYLCHOLINE CHLORIDE 200 MG/10ML IV SOSY
PREFILLED_SYRINGE | INTRAVENOUS | Status: DC | PRN
  Administered 2021-03-16: 160 mg via INTRAVENOUS

## 2021-03-16 MED ORDER — FENTANYL CITRATE (PF) 250 MCG/5ML IJ SOLN
INTRAMUSCULAR | Status: DC | PRN
  Administered 2021-03-16: 50 ug via INTRAVENOUS
  Administered 2021-03-16: 25 ug via INTRAVENOUS
  Administered 2021-03-16: 50 ug via INTRAVENOUS
  Administered 2021-03-16: 100 ug via INTRAVENOUS
  Administered 2021-03-16: 50 ug via INTRAVENOUS

## 2021-03-16 MED ORDER — SUGAMMADEX SODIUM 200 MG/2ML IV SOLN
INTRAVENOUS | Status: DC | PRN
  Administered 2021-03-16: 300 mg via INTRAVENOUS

## 2021-03-16 MED ORDER — DEXAMETHASONE SODIUM PHOSPHATE 10 MG/ML IJ SOLN
INTRAMUSCULAR | Status: DC | PRN
  Administered 2021-03-16: 10 mg via INTRAVENOUS

## 2021-03-16 MED ORDER — ONDANSETRON HCL 4 MG/2ML IJ SOLN
INTRAMUSCULAR | Status: AC
  Filled 2021-03-16: qty 2

## 2021-03-16 MED ORDER — PROPOFOL 10 MG/ML IV BOLUS
INTRAVENOUS | Status: AC
  Filled 2021-03-16: qty 20

## 2021-03-16 MED ORDER — ARTIFICIAL TEARS OPHTHALMIC OINT
TOPICAL_OINTMENT | OPHTHALMIC | Status: DC | PRN
  Administered 2021-03-16: 1 via OPHTHALMIC

## 2021-03-16 MED ORDER — ESMOLOL HCL 100 MG/10ML IV SOLN
INTRAVENOUS | Status: DC | PRN
  Administered 2021-03-16 (×2): 20 mg via INTRAVENOUS

## 2021-03-16 MED ORDER — ONDANSETRON HCL 4 MG/2ML IJ SOLN
INTRAMUSCULAR | Status: DC | PRN
  Administered 2021-03-16: 4 mg via INTRAVENOUS

## 2021-03-16 MED ORDER — CHLORHEXIDINE GLUCONATE CLOTH 2 % EX PADS: 6.0000 | MEDICATED_PAD | Freq: Once | CUTANEOUS | Status: DC

## 2021-03-16 MED ORDER — LACTATED RINGERS IV BOLUS
1000.0000 mL | Freq: Once | INTRAVENOUS | Status: AC
  Administered 2021-03-16: 1000 mL via INTRAVENOUS

## 2021-03-16 MED ORDER — ESMOLOL HCL 100 MG/10ML IV SOLN
INTRAVENOUS | Status: AC
  Filled 2021-03-16: qty 10

## 2021-03-16 MED ORDER — FENTANYL CITRATE (PF) 100 MCG/2ML IJ SOLN
100.0000 ug | Freq: Once | INTRAMUSCULAR | Status: AC
  Administered 2021-03-16: 100 ug via INTRAVENOUS
  Filled 2021-03-16: qty 2

## 2021-03-16 SURGICAL SUPPLY — 50 items
BAG COUNTER SPONGE SURGICOUNT (BAG) ×2 IMPLANT
BLADE CLIPPER SURG (BLADE) IMPLANT
CANISTER SUCT 3000ML PPV (MISCELLANEOUS) ×2 IMPLANT
CHLORAPREP W/TINT 26 (MISCELLANEOUS) ×2 IMPLANT
COVER SURGICAL LIGHT HANDLE (MISCELLANEOUS) ×2 IMPLANT
DERMABOND ADVANCED (GAUZE/BANDAGES/DRESSINGS) ×1
DERMABOND ADVANCED .7 DNX12 (GAUZE/BANDAGES/DRESSINGS) ×1 IMPLANT
DRAPE INCISE IOBAN 66X45 STRL (DRAPES) ×2 IMPLANT
DRAPE LAPAROSCOPIC ABDOMINAL (DRAPES) ×2 IMPLANT
DRAPE WARM FLUID 44X44 (DRAPES) ×2 IMPLANT
DRSG OPSITE POSTOP 4X10 (GAUZE/BANDAGES/DRESSINGS) IMPLANT
DRSG OPSITE POSTOP 4X8 (GAUZE/BANDAGES/DRESSINGS) IMPLANT
ELECT BLADE 6.5 EXT (BLADE) ×2 IMPLANT
ELECT CAUTERY BLADE 6.4 (BLADE) ×2 IMPLANT
ELECT REM PT RETURN 9FT ADLT (ELECTROSURGICAL) ×2
ELECTRODE REM PT RTRN 9FT ADLT (ELECTROSURGICAL) ×1 IMPLANT
GLOVE SURG POLY MICRO LF SZ5.5 (GLOVE) ×2 IMPLANT
GLOVE SURG UNDER POLY LF SZ6 (GLOVE) ×2 IMPLANT
GOWN STRL REUS W/ TWL LRG LVL3 (GOWN DISPOSABLE) ×2 IMPLANT
GOWN STRL REUS W/TWL LRG LVL3 (GOWN DISPOSABLE) ×4
HANDLE SUCTION POOLE (INSTRUMENTS) ×1 IMPLANT
KIT BASIN OR (CUSTOM PROCEDURE TRAY) ×2 IMPLANT
KIT TURNOVER KIT B (KITS) ×2 IMPLANT
LIGASURE IMPACT 36 18CM CVD LR (INSTRUMENTS) IMPLANT
NS IRRIG 1000ML POUR BTL (IV SOLUTION) ×4 IMPLANT
PACK GENERAL/GYN (CUSTOM PROCEDURE TRAY) ×2 IMPLANT
PAD ARMBOARD 7.5X6 YLW CONV (MISCELLANEOUS) ×2 IMPLANT
PENCIL SMOKE EVACUATOR (MISCELLANEOUS) ×2 IMPLANT
RETRACTOR WOUND ALXS 34CM XLRG (MISCELLANEOUS) ×1 IMPLANT
RTRCTR WOUND ALEXIS 34CM XLRG (MISCELLANEOUS) ×2
SET TUBE SMOKE EVAC HIGH FLOW (TUBING) ×2 IMPLANT
SLEEVE SUCTION CATH 165 (SLEEVE) ×2 IMPLANT
SPECIMEN JAR LARGE (MISCELLANEOUS) IMPLANT
SPONGE T-LAP 18X18 ~~LOC~~+RFID (SPONGE) ×8 IMPLANT
STAPLER VISISTAT 35W (STAPLE) IMPLANT
SUCTION POOLE HANDLE (INSTRUMENTS) ×2
SUT MNCRL AB 4-0 PS2 18 (SUTURE) ×2 IMPLANT
SUT PDS AB 1 TP1 96 (SUTURE) ×4 IMPLANT
SUT SILK 2 0 SH CR/8 (SUTURE) ×2 IMPLANT
SUT SILK 2 0 TIES 10X30 (SUTURE) ×2 IMPLANT
SUT SILK 3 0 SH CR/8 (SUTURE) ×4 IMPLANT
SUT SILK 3 0 TIES 10X30 (SUTURE) ×2 IMPLANT
SUT VIC AB 3-0 SH 18 (SUTURE) ×6 IMPLANT
SUT VIC AB 3-0 SH 27 (SUTURE) ×8
SUT VIC AB 3-0 SH 27X BRD (SUTURE) ×1 IMPLANT
SUT VIC AB 3-0 SH 27XBRD (SUTURE) ×3 IMPLANT
TOWEL GREEN STERILE (TOWEL DISPOSABLE) ×2 IMPLANT
TRAY FOLEY MTR SLVR 16FR STAT (SET/KITS/TRAYS/PACK) ×2 IMPLANT
TROCAR XCEL NON-BLD 5MMX100MML (ENDOMECHANICALS) ×2 IMPLANT
YANKAUER SUCT BULB TIP NO VENT (SUCTIONS) ×2 IMPLANT

## 2021-03-16 NOTE — Progress Notes (Signed)
Pharmacy Antibiotic Note  Janice Payne is a 75 y.o. female admitted on 03/16/2021 with  bowel perforation .  Pharmacy has been consulted for cefepime and metronidazole dosing.  Non-type 1 allergy to penicillin (reaction: rash) noted.  Plan: Cefepime 2g q12h Metronidazole '500mg'$  q8h Follow-up cultures and de-escalate antibiotics when/if appropriate     Temp (24hrs), Avg:98.4 F (36.9 C), Min:98.4 F (36.9 C), Max:98.4 F (36.9 C)  Recent Labs  Lab 03/16/21 1715 03/16/21 1725  WBC 11.8*  --   CREATININE  --  1.50*    CrCl cannot be calculated (Unknown ideal weight.).    Allergies  Allergen Reactions   Niacin Itching and Rash   Codeine Nausea And Vomiting and Nausea Only   Niaspan [Niacin Er] Nausea And Vomiting   Penicillins Rash   Antimicrobials this admission: cefepime 7/25 >>  metronidazole 7/25 >>   Microbiology results: Pending  Thank you for allowing pharmacy to be a part of this patient's care.  Lorelei Pont, PharmD, BCPS 03/16/2021 6:03 PM ED Clinical Pharmacist -  [phone]

## 2021-03-16 NOTE — ED Provider Notes (Signed)
[facility] EMERGENCY DEPARTMENT Provider Note   CSN: [id number] Arrival date & time: 03/16/21  1646     History Chief Complaint  Patient presents with   Abdominal Pain    Janice Payne is a 75 y.o. female presenting for evaluation of abdominal pain.  Patient states she had a routine colonoscopy today.  Afterwards, she had some upper abdominal pain, generally thought to be due to gas.  She had an x-ray at [facility] radiology which showed a large amount of free air concerning for bowel perforation.  Patient is also having rectal bleeding.  She had 1 episode of emesis, none since.  No fevers, chills, chest pain, shortness of breath, dizziness, lightheadedness, headache.  She continues to have severe abdominal pain, worse when she stands up.  Additional history obtained per chart review.  History of DM, hypertension, hyperlipidemia, GERD, arthritis   HPI     Past Medical History:  Diagnosis Date   Arthritis    left hip, neck   Dental crown present    Dry mouth    GERD (gastroesophageal reflux disease)    History of asthma    no current problems or med.   History of kidney stones    Hyperlipidemia    Hypertension    under control with meds.   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   Seasonal allergies    Sinus headache     Patient Active Problem List   Diagnosis Date Noted   Severe obesity (BMI 35.0-39.9) with comorbidity ([place]) 03/06/2021   Peripheral autonomic neuropathy due to secondary diabetes ([place]) 12/06/2018   GERD with esophagitis 10/25/2018   Chronic daily headache 01/21/2016   Heart murmur, systolic 40/98/1191   Allergic rhinitis 10/02/2014   Post-operative state 12/07/2013   Papilloma of breast 10/08/2013   CKD (chronic kidney disease) stage 3, GFR 30-59 ml/min ([facility]) 05/08/2013   Nephrolithiasis 11/03/2012   Generalized anxiety disorder 01/01/2011   Mixed hyperlipidemia 08/28/2010    Type 2 diabetes mellitus with complication, with long-term current use of insulin ([place]) 07/29/2010   Vitamin D deficiency 06/18/2010   Essential hypertension with goal blood pressure less than 140/90 05/06/2010   Esophageal reflux 03/05/2010    Past Surgical History:  Procedure Laterality Date   BREAST BIOPSY Right 11/14/2013   Procedure: RIGHT BREAST NEEDLE LOCALIZATION;  Surgeon: Marcello Moores A. Cornett, MD;  Location: [facility];  Service: General;  Laterality: Right;   EXTRACORPOREAL SHOCK WAVE LITHOTRIPSY  11/27/2012   TUBAL LIGATION       OB History   No obstetric history on file.     Family History  Problem Relation Age of Onset   Heart disease Father     Social History   Tobacco Use   Smoking status: Never   Smokeless tobacco: Never  Substance Use Topics   Alcohol use: No   Drug use: No    Home Medications Prior to Admission medications   Medication Sig Start Date End Date Taking? Authorizing Provider  ALPRAZolam Duanne Moron) 0.5 MG tablet Take 0.5 mg by mouth at bedtime as needed for sleep.    [provider]  ALPRAZolam Duanne Moron) 0.5 MG tablet Take by mouth.    [provider]  aspirin 81 MG chewable tablet Chew by mouth.    [provider]  aspirin 81 MG tablet Take 81 mg by mouth daily.    [provider]  azithromycin (ZITHROMAX) 250  MG tablet Take 2 tabs PO x 1 dose, then 1 tab PO QD x 4 days 01/30/14   Darlyne Russian, MD  Blood Glucose Monitoring Suppl (ONE TOUCH ULTRA 2) w/Device KIT Use to check blood sugar as directed 06/18/20   [provider]  calcium-vitamin D (OSCAL WITH D) 500-200 MG-UNIT per tablet Take 1 tablet by mouth 2 (two) times daily.    [provider]  cetirizine (ZYRTEC) 10 MG tablet Take 10 mg by mouth daily.    [provider]  cetirizine (ZYRTEC) 10 MG tablet Take by mouth.    [provider]  Cholecalciferol (VITAMIN D) 2000 UNITS tablet Take 2,000 Units by mouth  daily.    [provider]  ciprofloxacin (CIPRO) 500 MG tablet Take 500 mg by mouth 2 (two) times daily. 10/02/20   [provider]  clotrimazole-betamethasone (LOTRISONE) cream Apply 1 application topically 2 (two) times daily.    [provider]  estradiol (ESTRACE) 0.1 MG/GM vaginal cream Apply pea shaped amount three nights/week. 06/03/20   [provider]  fenofibrate 160 MG tablet Take 160 mg by mouth daily before breakfast.    [provider]  fenofibrate 160 MG tablet Take 1 tablet by mouth daily. 03/27/17   [provider]  gabapentin (NEURONTIN) 300 MG capsule Take 1 capsule by mouth 2 (two) times daily. 11/05/20   [provider]  glimepiride (AMARYL) 2 MG tablet Take 4 mg by mouth daily with breakfast.  10/02/13   [provider]  glucagon 1 MG injection Follow package directions for low blood sugar. 03/15/17   [provider]  Glucose Blood (BLOOD GLUCOSE TEST STRIPS) STRP Test blood sugars twice daily. Dispense strips for Onetouch Ultra 2 06/18/20   [provider]  hydrochlorothiazide (HYDRODIURIL) 12.5 MG tablet Take 1 tablet by mouth daily. 04/29/17   [provider]  HYDROcodone-acetaminophen (NORCO/VICODIN) 5-325 MG per tablet Take 1 tablet by mouth every 6 (six) hours as needed for moderate pain. 01/30/14   Darlyne Russian, MD  Hyoscyamine Sulfate SL 0.125 MG SUBL Take by mouth. 03/14/20   [provider]  insulin NPH-regular Human (NOVOLIN 70/30) (70-30) 100 UNIT/ML injection INJECT 25 TO 60 UNITS INTO THE SKIN 2 TIMES DAILY AS DIRECTED WITH MEAL. DOSE BASED ON TITRATION GUIDELINE. 02/22/17   [provider]  Insulin Pen Needle 31G X 6 MM MISC See admin instructions. 11/03/16   [provider]  losartan (COZAAR) 50 MG tablet Take 50 mg by mouth daily before breakfast.    [provider]  losartan (COZAAR) 50 MG tablet Take by mouth. 12/04/19   [provider]  metFORMIN (GLUCOPHAGE) 500 MG tablet Take by mouth.    [provider]  metFORMIN (GLUCOPHAGE-XR) 500 MG 24 hr tablet Take 1,000 mg by mouth daily with breakfast.     [provider]  metoprolol succinate (TOPROL-XL) 50 MG 24 hr tablet Take 50 mg by mouth daily before breakfast. Take with or immediately following a meal.    [provider]  metoprolol tartrate (LOPRESSOR) 50 MG tablet Take by mouth.    [provider]  metroNIDAZOLE (FLAGYL) 500 MG tablet Take 500 mg by mouth 3 (three) times daily. 10/02/20   [provider]  omeprazole (PRILOSEC) 40 MG capsule Take 1 capsule by mouth daily. 01/26/21   [provider]  Omeprazole-Sodium Bicarbonate (ZEGERID) 20-1100 MG CAPS capsule Take 1 capsule by mouth daily before breakfast.    [provider]  PLENVU 140 g SOLR See admin instructions. 12/13/20   [provider]  pravastatin (PRAVACHOL) 20 MG tablet Take 20 mg by mouth at bedtime.     [provider]  RELION INSULIN SYRINGE 1ML/31G 31G X 5/16" 1 ML MISC Inject into the skin 2 (two) times daily. 03/02/21   [provider]  sertraline (ZOLOFT) 50 MG tablet Take 50 mg by mouth at bedtime.     [provider]  sertraline (ZOLOFT) 50 MG tablet Take 1 tablet by mouth daily. 11/05/20   [provider]  traMADol (ULTRAM) 50 MG tablet Take 1 tablet (50 mg total) by mouth every 6 (six) hours as needed. 11/14/13   Cornett, Marcello Moores, MD    Allergies    Niacin, Codeine, Niaspan [niacin er], and Penicillins  Review of Systems   Review of Systems  Gastrointestinal:  Positive for abdominal pain.  All other systems reviewed and are negative.  Physical Exam Updated Vital Signs BP (!) 149/84 (BP Location: Left Arm)   Pulse (!) 101   Temp 98.4 F (36.9 C) (Oral)   Resp 18   SpO2 100%   Physical Exam  ED Results / Procedures / Treatments   Labs (all labs ordered are listed, but only  abnormal results are displayed) Labs Reviewed  RESP PANEL BY RT-PCR (FLU A&B, COVID) ARPGX2  CBC WITH DIFFERENTIAL/PLATELET  COMPREHENSIVE METABOLIC PANEL  I-STAT CHEM 8, ED  TYPE AND SCREEN    EKG None  Radiology DG Abd 2 Views  Result Date: 03/16/2021 CLINICAL DATA:  Abdominal pain.  Status post colonoscopy today. EXAM: ABDOMEN - 2 VIEW COMPARISON:  None. FINDINGS: No evidence of dilated bowel loops, however a moderate to large amount of free intraperitoneal air is seen, consistent with bowel perforation. IMPRESSION: Moderate to large amount of free intraperitoneal air, consistent with bowel perforation. No evidence of bowel obstruction. These results were called by telephone at the time of interpretation on 03/16/2021 at 4:26 pm to provider Dr. Gilmore Laroche nurse Judeen Hammans , who verbally acknowledged these results. Electronically Signed   By: Marlaine Hind M.D.   On: 03/16/2021 16:28    Procedures Procedures   Medications Ordered in ED Medications - No data to display  ED Course  I have reviewed the triage vital signs and the nursing notes.  Pertinent labs & imaging results that were available during my care of the patient were reviewed by me and considered in my medical decision making (see chart for details).    MDM Rules/Calculators/A&P                           Patient presented for evaluation of abdominal pain after a colonoscopy.  On exam, patient appears uncomfortable due to pain.  She has diffuse tenderness palpation of the abdomen, however no rigidity on my exam.  Vital signs are stable.  X-ray from outside facility able to be reviewed through epic, shows large amount of free air consistent with bowel purge.  As such, will start antibiotics, order labs, and consult general surgery. I discussed with Dr. Zenia Resides from general surgery who recommends CT scan with rectal contrast.  Labs show minimally elevated lactic at 2.  Otherwise reassuring.  Of note, patient's COVID test  is positive, however she was positive 2 months ago.  This is likely a residual positive test, patient is without any COVID symptoms including fever, cough, shortness of breath.  Pt signed out to  Elpidio Anis, MD for f/u on CT scan and dispo  Final Clinical Impression(s) / ED Diagnoses Final diagnoses:  None    Rx / DC Orders ED Discharge Orders     None        Franchot Heidelberg, PA-C 03/16/21 1900    Lennice Sites, DO 03/16/21 2327

## 2021-03-16 NOTE — Anesthesia Procedure Notes (Signed)
Procedure Name: Intubation Date/Time: 03/16/2021 10:12 PM Performed by: Jearld Pies, CRNA Pre-anesthesia Checklist: Patient identified, Emergency Drugs available, Suction available and Patient being monitored Patient Re-evaluated:Patient Re-evaluated prior to induction Oxygen Delivery Method: Circle System Utilized Preoxygenation: Pre-oxygenation with 100% oxygen Induction Type: IV induction and Rapid sequence Laryngoscope Size: Glidescope and 3 Grade View: Grade I Tube type: Oral Tube size: 7.0 mm Number of attempts: 1 Airway Equipment and Method: Stylet and Oral airway Placement Confirmation: ETT inserted through vocal cords under direct vision, positive ETCO2 and breath sounds checked- equal and bilateral Secured at: 21 cm Tube secured with: Tape Dental Injury: Teeth and Oropharynx as per pre-operative assessment  Comments: DL by this CRNA and Moser MD, G3 view, Glidescope utilized, Difficult airway status applied by MD.

## 2021-03-16 NOTE — Anesthesia Preprocedure Evaluation (Addendum)
Anesthesia Evaluation  Patient identified by MRN, date of birth, ID band Patient awake    Reviewed: Allergy & Precautions, NPO status , Patient's Chart, lab work & pertinent test results  History of Anesthesia Complications Negative for: history of anesthetic complications  Airway Mallampati: IV  TM Distance: <3 FB Neck ROM: Full    Dental  (+) Dental Advisory Given, Teeth Intact   Pulmonary neg pulmonary ROS, neg shortness of breath, neg sleep apnea, neg COPD, neg recent URI,  Covid-19 Nucleic Acid Test Results Lab Results      Component                Value               Date                      SARSCOV2NAA              POSITIVE (A)        03/16/2021                SARSCOV2NAA              POSITIVE (A)        01/02/2021              breath sounds clear to auscultation       Cardiovascular hypertension, Pt. on medications (-) angina(-) Past MI  Rhythm:Regular     Neuro/Psych  Headaches, PSYCHIATRIC DISORDERS Anxiety    GI/Hepatic GERD  , Perforated colon   Endo/Other  diabetes, Type 2Morbid obesityNo results found for: HGBA1C   Renal/GU CRFRenal diseaseLab Results      Component                Value               Date                      CREATININE               1.50 (H)            03/16/2021           Lab Results      Component                Value               Date                      K                        3.8                 03/16/2021                Musculoskeletal  (+) Arthritis ,   Abdominal   Peds  Hematology negative hematology ROS (+) Lab Results      Component                Value               Date                      WBC                      11.8 (H)  03/16/2021                HGB                      14.6                03/16/2021                HCT                      43.0                03/16/2021                MCV                      86.9                03/16/2021                 PLT                      179                 03/16/2021              Anesthesia Other Findings   Reproductive/Obstetrics                            Anesthesia Physical Anesthesia Plan  ASA: 3 and emergent  Anesthesia Plan: General   Post-op Pain Management:    Induction: Intravenous, Rapid sequence and Cricoid pressure planned  PONV Risk Score and Plan: 3 and Ondansetron and Dexamethasone  Airway Management Planned: Oral ETT and Video Laryngoscope Planned  Additional Equipment: None  Intra-op Plan:   Post-operative Plan: Extubation in OR  Informed Consent: I have reviewed the patients History and Physical, chart, labs and discussed the procedure including the risks, benefits and alternatives for the proposed anesthesia with the patient or authorized representative who has indicated his/her understanding and acceptance.     Dental advisory given  Plan Discussed with: CRNA and Anesthesiologist  Anesthesia Plan Comments:        Anesthesia Quick Evaluation

## 2021-03-16 NOTE — Consult Note (Addendum)
FALLAN [place]LE 1946-03-18  [id number]:[id number].    Requesting MD: Dr. Alessandra Bevels Chief Complaint/Reason for Consult: pneumoperitoneum after colonoscopy  HPI:  Ms. Frangipane is a 75 yo female who underwent a screening colonoscopy today after having an episode of acute diverticulitis several months ago. During the procedure she was found to have a polyp in the cecum. A polypectomy was performed and the site was closed with a clip. The patient had abdominal pain after waking up from the procedure, and after she got home had an episode of vomiting. She had a KUB done as an outpatient, which showed free intraperitoneal air. She was sent to the ED for further workup.  She has been afebrile and hemodynamically stable since arrival. She reports that her pain is mostly in the upper abdomen with radiation to the shoulders, and she feels bloated. She has also passed a small amount of blood per rectum. WBC is 11.8. Creatinine is mildly elevated at 1.5, but unclear what her baseline is.  ROS: Review of Systems  Constitutional:  Negative for chills and fever.  Respiratory:  Negative for shortness of breath.   Cardiovascular:  Negative for chest pain.  Gastrointestinal:  Positive for abdominal pain, nausea and vomiting.  Neurological:  Negative for weakness.   Family History  Problem Relation Age of Onset   Heart disease Father     Past Medical History:  Diagnosis Date   Arthritis    left hip, neck   Dental crown present    Dry mouth    GERD (gastroesophageal reflux disease)    History of asthma    no current problems or med.   History of kidney stones    Hyperlipidemia    Hypertension    under control with meds.   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 Cornett, MD;  Location: [facility];  Service: General;  Laterality: Right;   EXTRACORPOREAL SHOCK WAVE LITHOTRIPSY  11/27/2012   TUBAL LIGATION      Social History:  reports that she has never smoked. She has never used smokeless tobacco. She reports that she does not drink alcohol and does not use drugs.  Allergies:  Allergies  Allergen Reactions   Niacin Itching and Rash   Codeine Nausea And Vomiting and Nausea Only   Niaspan [Niacin Er] Nausea And Vomiting   Penicillins Rash    (Not in a hospital admission)    Physical Exam: Blood pressure 129/62, pulse 82, temperature 98.4 F (36.9 C), temperature source Oral, resp. rate 20, SpO2 100 %. General: resting comfortably, appears stated age, no apparent distress Neurological: alert and oriented, no focal deficits, cranial nerves grossly in tact HEENT: normocephalic, atraumatic, oropharynx clear, no scleral icterus CV: regular rate and rhythm, extremities warm and well-perfused Respiratory: normal work of breathing, symmetric chest wall expansion Abdomen: distended but compressible, mildly tender in the RLQ with no guarding, rigidity or rebound tenderness. Extremities: warm and well-perfused, no deformities, moving all extremities spontaneously Psychiatric: normal mood and affect Skin: warm and dry, no jaundice, no rashes or lesions   Results for orders placed or performed during the hospital encounter of 03/16/21 (from the past 48 hour(s))  CBC with Differential     Status:  Abnormal   Collection Time: 03/16/21  5:15 PM  Result Value Ref Range   WBC 11.8 (H) 4.0 - 10.5 K/uL   RBC 4.82 3.87 - 5.11 MIL/uL   Hemoglobin 14.2 12.0 - 15.0 g/dL   HCT 41.9 36.0 - 46.0 %   MCV 86.9 80.0 - 100.0 fL   MCH 29.5 26.0 - 34.0 pg   MCHC 33.9 30.0 - 36.0 g/dL   RDW 12.7 11.5 - 15.5 %   Platelets 179 150 - 400 K/uL   nRBC 0.0 0.0 - 0.2 %   Neutrophils Relative % 87 %   Neutro Abs 10.2 (H) 1.7 - 7.7 K/uL   Lymphocytes  Relative 7 %   Lymphs Abs 0.9 0.7 - 4.0 K/uL   Monocytes Relative 5 %   Monocytes Absolute 0.5 0.1 - 1.0 K/uL   Eosinophils Relative 0 %   Eosinophils Absolute 0.0 0.0 - 0.5 K/uL   Basophils Relative 0 %   Basophils Absolute 0.1 0.0 - 0.1 K/uL   Immature Granulocytes 1 %   Abs Immature Granulocytes 0.10 (H) 0.00 - 0.07 K/uL    Comment: Performed at [facility], [address], [place], [place] [address]  Comprehensive metabolic panel     Status: Abnormal   Collection Time: 03/16/21  5:15 PM  Result Value Ref Range   Sodium 136 135 - 145 mmol/L   Potassium 3.9 3.5 - 5.1 mmol/L   Chloride 101 98 - 111 mmol/L   CO2 21 (L) 22 - 32 mmol/L   Glucose, Bld 235 (H) 70 - 99 mg/dL    Comment: Glucose reference range applies only to samples taken after fasting for at least 8 hours.   BUN 15 8 - 23 mg/dL   Creatinine, Ser 1.62 (H) 0.44 - 1.00 mg/dL   Calcium 9.5 8.9 - 10.3 mg/dL   Total Protein 7.2 6.5 - 8.1 g/dL   Albumin 4.0 3.5 - 5.0 g/dL   AST 24 15 - 41 U/L   ALT 21 0 - 44 U/L   Alkaline Phosphatase 38 38 - 126 U/L   Total Bilirubin 1.0 0.3 - 1.2 mg/dL   GFR, Estimated 33 (L) >60 mL/min    Comment: (NOTE) Calculated using the CKD-EPI Creatinine Equation (2021)    Anion gap 14 5 - 15    Comment: Performed at [facility] [address], [place], [place] [address]  Type and screen [place]     Status: None   Collection Time: 03/16/21  5:15 PM  Result Value Ref Range   ABO/RH(D) A POS    Antibody Screen NEG    Sample Expiration      03/19/2021,2359 Performed at [facility], [place] [address], [place], [place] [address]   Lactic acid, plasma     Status: Abnormal   Collection Time: 03/16/21  5:17 PM  Result Value Ref Range   Lactic Acid, Venous 2.0 (HH) 0.5 - 1.9 mmol/L    Comment: CRITICAL RESULT CALLED TO, READ BACK BY AND VERIFIED WITH: A.OAKLEY,RN '@[id number]'$  03/16/2021 VANG.J Performed at [facility], [place] [address], [place], [place]  [address]   I-stat chem 8, ED (not at [facility] or [facility])     Status: Abnormal   Collection Time: 03/16/21  5:25 PM  Result Value Ref Range   Sodium 137 135 - 145 mmol/L   Potassium 3.8 3.5 - 5.1 mmol/L   Chloride 104 98 - 111 mmol/L  BUN 17 8 - 23 mg/dL   Creatinine, Ser 1.50 (H) 0.44 - 1.00 mg/dL   Glucose, Bld 232 (H) 70 - 99 mg/dL    Comment: Glucose reference range applies only to samples taken after fasting for at least 8 hours.   Calcium, Ion 1.13 (L) 1.15 - 1.40 mmol/L   TCO2 21 (L) 22 - 32 mmol/L   Hemoglobin 14.6 12.0 - 15.0 g/dL   HCT 43.0 36.0 - 46.0 %  Resp Panel by RT-PCR (Flu A&B, Covid) Nasopharyngeal Swab     Status: Abnormal   Collection Time: 03/16/21  5:25 PM   Specimen: Nasopharyngeal Swab; Nasopharyngeal(NP) swabs in vial transport medium  Result Value Ref Range   SARS Coronavirus 2 by RT PCR POSITIVE (A) NEGATIVE    Comment: RESULT CALLED TO, READ BACK BY AND VERIFIED WITH: C MAS RN [id number] 03/16/21 A BROWNING (NOTE) SARS-CoV-2 target nucleic acids are DETECTED.  The SARS-CoV-2 RNA is generally detectable in upper respiratory specimens during the acute phase of infection. Positive results are indicative of the presence of the identified virus, but do not rule out bacterial infection or co-infection with other pathogens not detected by the test. Clinical correlation with patient history and other diagnostic information is necessary to determine patient infection status. The expected result is Negative.  Fact Sheet for Patients: EntrepreneurPulse.com.au  Fact Sheet for Healthcare Providers: IncredibleEmployment.be  This test is not yet approved or cleared by the [place] FDA and  has been authorized for detection and/or diagnosis of SARS-CoV-2 by FDA under an Emergency Use Authorization (EUA).  This EUA will remain in effect (meaning this test can be  used) for the duration of  the COVID-19 declaration under Section 564(b)(1)  of the Act, 21 U.S.C. section 360bbb-3(b)(1), unless the authorization is terminated or revoked sooner.     Influenza A by PCR NEGATIVE NEGATIVE   Influenza B by PCR NEGATIVE NEGATIVE    Comment: (NOTE) The Xpert Xpress SARS-CoV-2/FLU/RSV plus assay is intended as an aid in the diagnosis of influenza from Nasopharyngeal swab specimens and should not be used as a sole basis for treatment. Nasal washings and aspirates are unacceptable for Xpert Xpress SARS-CoV-2/FLU/RSV testing.  Fact Sheet for Patients: EntrepreneurPulse.com.au  Fact Sheet for Healthcare Providers: IncredibleEmployment.be  This test is not yet approved or cleared by the [place] FDA and has been authorized for detection and/or diagnosis of SARS-CoV-2 by FDA under an Emergency Use Authorization (EUA). This EUA will remain in effect (meaning this test can be used) for the duration of the COVID-19 declaration under Section 564(b)(1) of the Act, 21 U.S.C. section 360bbb-3(b)(1), unless the authorization is terminated or revoked.  Performed at [facility],  [place] [address], [place], [place] [address]    DG Abd 2 Views  Result Date: 03/16/2021 CLINICAL DATA:  Abdominal pain.  Status post colonoscopy today. EXAM: ABDOMEN - 2 VIEW COMPARISON:  None. FINDINGS: No evidence of dilated bowel loops, however a moderate to large amount of free intraperitoneal air is seen, consistent with bowel perforation. IMPRESSION: Moderate to large amount of free intraperitoneal air, consistent with bowel perforation. No evidence of bowel obstruction. These results were called by telephone at the time of interpretation on 03/16/2021 at 4:26 pm to provider Dr. Gilmore Laroche nurse Judeen Hammans , who verbally acknowledged these results. Electronically Signed   By: Marlaine Hind M.D.   On: 03/16/2021 16:28      Assessment/Plan 75 yo female s/p colonoscopy with abdominal pain and free  air on abdominal  plain films. She is clinically stable and non-toxic, and on exam she does not have signs of peritonitis. It is possible the free air in the abdomen is from colonoscopic insufflation and occurred prior to closure of the polypectomy site. Since the patient is currently stable without peritonitis on exam, we will proceed with a CT abdomen/pelvis with rectal contrast. If there is large-volume free air or free extravasation of contrast, the patient will need to go to the OR for abdominal exploration. If the perforation appears to be contained, the patient can be observed with serial abdominal exams, bowel rest, and IV antibiotics. If she develops any signs of sepsis (fevers, tachycardia, etc.) or peritonitis she will need to be taken to the OR for urgent exploration.  Final plan pending results of CT scan.   Michaelle Birks, MD [facility] Surgery General, Hepatobiliary and Pancreatic Surgery 03/16/21 7:21 PM   CT scan reviewed. Shows large-volume intraperitoneal air. No obvious contrast extravasation but rectal contrast appears very dilute. Patient remains stable but continues to have significant abdominal pain. Given symptoms and degree of free air on imaging, will proceed to OR for exploratory laparotomy. I discussed the planned procedure with the patient including the possible need for a colon resection. I think it is unlikely but possible that she will need a colostomy, which I discussed with her and her son. All questions answered. Admit postoperatively.

## 2021-03-16 NOTE — ED Triage Notes (Signed)
Pt reports that she had a routine colonoscopy this morning, polyp removed, c/o constant upper abdominal pain and rectal bleeding when using the bathroom. Sent for further evaluation.

## 2021-03-16 NOTE — ED Provider Notes (Signed)
Emergency Medicine Provider Triage Evaluation Note  Janice Payne , a 75 y.o. female  was evaluated in triage.  Pt complains of abd pain and rectal bleeding.   Pt had routine colonoscopy today. Had increased pain in the abd and rectal bleeding. Xray of the abd showed bowel perf, and pt sent to the ER. No dizziness or lightheadednes. She is not on blood thinners  Review of Systems  Positive: Abd pain Negative: fever  Physical Exam  There were no vitals taken for this visit. Gen:   Awake, no distress   Resp:  Normal effort  MSK:   Moves extremities without difficulty  Other:  Diffuse ttp of the abd  Medical Decision Making  Medically screening exam initiated at 4:58 PM.  Appropriate orders placed.  [name] was informed that the remainder of the evaluation will be completed by another provider, this initial triage assessment does not replace that evaluation, and the importance of remaining in the ED until their evaluation is complete.  Labs and covid. Ct ordered. Pt to be roomed next. Charge RN made aware   Franchot Heidelberg, PA-C 03/16/21 [place], [place], DO 03/16/21 2327

## 2021-03-16 NOTE — ED Provider Notes (Signed)
I personally evaluated the patient during the encounter and completed a history, physical, procedures, medical decision making to contribute to the overall care of the patient and decision making for the patient briefly, the patient is a 75 y.o. female here with abdominal pain after colonoscopy today.  Had fairly immediate pain after colonoscopy today.  She had a x-ray done that showed signs consistent with a bowel perforation.  Hemodynamically she stable.  She has moderate to large amount of free intraperitoneal air.  We will get lab work and start her on IV antibiotics and consult general surgery.  We will obtain a CT scan with and without contrast depending on her kidney function which she states that she has a history of CKD.  Anticipate admission to the OR/surgery service.  This chart was dictated using voice recognition software.  Despite best efforts to proofread,  errors can occur which can change the documentation meaning.    EKG Interpretation  Date/Time:  Monday March 16 2021 17:27:43 EDT Ventricular Rate:  102 PR Interval:  175 QRS Duration: 94 QT Interval:  343 QTC Calculation: 447 R Axis:   66 Text Interpretation: Sinus tachycardia Probable inferior infarct, age indeterminate Probable anterior infarct, age indeterminate Confirmed by Lennice Sites [phone]) on 03/16/2021 5:36:10 PM            Lennice Sites, DO 03/16/21 1744

## 2021-03-16 NOTE — Progress Notes (Signed)
[facility] Gastroenterology Progress Note  JENNETH GILBERTI 75 y.o. [birth year]  CC:   Abdominal pain after colonoscopy   HPI : This is a 75 year old patient who underwent colonoscopy earlier today for follow-up on diverticulitis at outpatient [facility].  Patient was diagnosed with focal acute diverticulitis in the distal descending colon with possible microperforation in February 2022.  Please see care everywhere for details.  She had a follow-up CT in March 2022 which showed near complete resolution of previously seen diverticulitis as well as probable polyp or mass in the cecum.    Colonoscopy earlier today was somewhat difficult because of severe diverticulosis and diverticular related narrowing at sigmoid colon.  Abdominal pressure was used to advance scope up to the cecum.  She was found to have a large, 4 cm, semipedunculated polyp in the cecum which was removed with piecemeal fashion with hot snare.  Post polypectomy defect was closed with 1 clip.  There was another small polyp in the cecum which was removed with hot snare.  Patient had some abdominal pain in the recovery room after colonoscopy.  Abdominal exam at that time showed minimal abdominal distention and her abdomen was soft.  There was no peritoneal signs.  Bowel sounds was present.  Patient was subsequently discharged home and was advised to call us back if she develops any more abdominal pain, nausea or vomiting.  I did follow-up call 2 hours after the procedure and she was still having upper abdominal pain as well as  pain over the right shoulder. urgent abdominal x-ray was done (patient wanted to wait till tomorrow  to get x-ray done ) which showed moderate to large amount of free intraperitoneal air consistent with bowel perforation.  I called patient's son and advised him to come to the emergency room for admission.  Discussed case with Dr. Zenia Resides from surgical team.   ROS : Complaining of right shoulder pain.   Complaining of abdominal pain.  Complaining of nausea.  Denies any fever.  Denies chest pain or difficulty breathing.   Objective: Vital signs in last 24 hours: Vitals:   03/16/21 1945 03/16/21 2000  BP: 123/62 120/77  Pulse: 78 80  Resp: (!) 22 (!) 21  Temp:    SpO2: 100% 100%    Physical Exam:  General:  Alert, cooperative, no distress, appears stated age  Head:  Normocephalic, without obvious abnormality, atraumatic  Eyes:  , EOM's intact,   Lungs:   Clear to auscultation bilaterally, respirations unlabored  Heart:  Regular rate and rhythm, S1, S2 normal  Abdomen:   Abdomen is more distended compared to my previous exam, she has more tenderness towards right upper quadrant, bowel sounds present.  She does have some guarding.  No rebound.  Neuro Alert and oriented x3  Psych Somewhat anxious    Lab Results: Recent Labs    03/16/21 1715 03/16/21 1725  NA 136 137  K 3.9 3.8  CL 101 104  CO2 21*  --   GLUCOSE 235* 232*  BUN 15 17  CREATININE 1.62* 1.50*  CALCIUM 9.5  --    Recent Labs    03/16/21 1715  AST 24  ALT 21  ALKPHOS 38  BILITOT 1.0  PROT 7.2  ALBUMIN 4.0   Recent Labs    03/16/21 1715 03/16/21 1725  WBC 11.8*  --   NEUTROABS 10.2*  --   HGB 14.2 14.6  HCT 41.9 43.0  MCV 86.9  --   PLT 179  --  No results for input(s): LABPROT, INR in the last 72 hours.    Assessment/Plan: -Post polypectomy perforation after colonoscopy with removal of large, around 4 cm, semi -pedunculated polyp from cecum. Post polypectomy defect was closed with 1 clip.    Recommendation ------------------------ -Appreciate surgery input -CT scan showed large volume pneumoperitoneum without any spillage of contrast or focal inflammation.  Abdominal x-ray and CT report reviewed personally. -Case discussed with Dr. Zenia Resides at bedside.  Patient is having more abdominal pain and given large volume pneumoperitoneum, she will be taken to the [facility] for exploration.  -I  personally discussed with patient and patient's son at bedside. -Continue antibiotics.  Keep n.p.o. for now.  Further management per surgical team. -GI will follow   Otis Brace MD, [place] 03/16/2021, 8:51 PM  Contact #  [phone]

## 2021-03-16 NOTE — ED Notes (Signed)
Lactic 2.0 reported to Dr. Ronnald Nian

## 2021-03-17 ENCOUNTER — Encounter (HOSPITAL_COMMUNITY): Payer: Self-pay | Admitting: Surgery

## 2021-03-17 DIAGNOSIS — K631 Perforation of intestine (nontraumatic): Secondary | ICD-10-CM | POA: Diagnosis present

## 2021-03-17 LAB — GLUCOSE, CAPILLARY
Glucose-Capillary: 139 mg/dL — ABNORMAL HIGH (ref 70–99)
Glucose-Capillary: 152 mg/dL — ABNORMAL HIGH (ref 70–99)
Glucose-Capillary: 167 mg/dL — ABNORMAL HIGH (ref 70–99)
Glucose-Capillary: 215 mg/dL — ABNORMAL HIGH (ref 70–99)
Glucose-Capillary: 228 mg/dL — ABNORMAL HIGH (ref 70–99)
Glucose-Capillary: 229 mg/dL — ABNORMAL HIGH (ref 70–99)
Glucose-Capillary: 275 mg/dL — ABNORMAL HIGH (ref 70–99)

## 2021-03-17 LAB — BASIC METABOLIC PANEL
Anion gap: 11 (ref 5–15)
BUN: 14 mg/dL (ref 8–23)
CO2: 25 mmol/L (ref 22–32)
Calcium: 8.9 mg/dL (ref 8.9–10.3)
Chloride: 100 mmol/L (ref 98–111)
Creatinine, Ser: 1.46 mg/dL — ABNORMAL HIGH (ref 0.44–1.00)
GFR, Estimated: 37 mL/min — ABNORMAL LOW (ref >60)
Glucose, Bld: 267 mg/dL — ABNORMAL HIGH (ref 70–99)
Potassium: 4 mmol/L (ref 3.5–5.1)
Sodium: 136 mmol/L (ref 135–145)

## 2021-03-17 LAB — CBC
HCT: 36.7 % (ref 36.0–46.0)
Hemoglobin: 12.2 g/dL (ref 12.0–15.0)
MCH: 29.4 pg (ref 26.0–34.0)
MCHC: 33.2 g/dL (ref 30.0–36.0)
MCV: 88.4 fL (ref 80.0–100.0)
Platelets: 146 10*3/uL — ABNORMAL LOW (ref 150–400)
RBC: 4.15 MIL/uL (ref 3.87–5.11)
RDW: 12.8 % (ref 11.5–15.5)
WBC: 9 10*3/uL (ref 4.0–10.5)
nRBC: 0 % (ref 0.0–0.2)

## 2021-03-17 LAB — ABO/RH: ABO/RH(D): A POS

## 2021-03-17 MED ORDER — METOPROLOL SUCCINATE ER 50 MG PO TB24
50.0000 mg | ORAL_TABLET | Freq: Every day | ORAL | Status: DC
  Administered 2021-03-17 – 2021-03-22 (×6): 50 mg via ORAL
  Filled 2021-03-17 (×6): qty 1

## 2021-03-17 MED ORDER — PRAVASTATIN SODIUM 10 MG PO TABS
20.0000 mg | ORAL_TABLET | Freq: Every day | ORAL | Status: DC
  Administered 2021-03-17 – 2021-03-21 (×5): 20 mg via ORAL
  Filled 2021-03-17 (×5): qty 2

## 2021-03-17 MED ORDER — FENTANYL CITRATE (PF) 100 MCG/2ML IJ SOLN
INTRAMUSCULAR | Status: AC
  Filled 2021-03-17: qty 2

## 2021-03-17 MED ORDER — ACETAMINOPHEN 160 MG/5ML PO SOLN: 1000.0000 mg | Freq: Once | ORAL | Status: DC | PRN

## 2021-03-17 MED ORDER — ACETAMINOPHEN 10 MG/ML IV SOLN
1000.0000 mg | Freq: Once | INTRAVENOUS | Status: DC | PRN
  Administered 2021-03-17: 1000 mg via INTRAVENOUS

## 2021-03-17 MED ORDER — ACETAMINOPHEN 500 MG PO TABS
1000.0000 mg | ORAL_TABLET | Freq: Three times a day (TID) | ORAL | Status: DC
  Administered 2021-03-17 (×2): 1000 mg via ORAL
  Filled 2021-03-17: qty 2

## 2021-03-17 MED ORDER — HEPARIN SODIUM (PORCINE) 5000 UNIT/ML IJ SOLN
5000.0000 [IU] | Freq: Three times a day (TID) | INTRAMUSCULAR | Status: DC
  Administered 2021-03-17 – 2021-03-22 (×17): 5000 [IU] via SUBCUTANEOUS
  Filled 2021-03-17 (×17): qty 1

## 2021-03-17 MED ORDER — HYDROCHLOROTHIAZIDE 25 MG PO TABS: 12.5000 mg | ORAL_TABLET | Freq: Every day | ORAL | Status: DC

## 2021-03-17 MED ORDER — ACETAMINOPHEN 500 MG PO TABS: 1000.0000 mg | ORAL_TABLET | Freq: Once | ORAL | Status: DC | PRN

## 2021-03-17 MED ORDER — HYDROMORPHONE HCL 1 MG/ML IJ SOLN
0.5000 mg | INTRAMUSCULAR | Status: DC | PRN
  Administered 2021-03-17 – 2021-03-18 (×4): 0.5 mg via INTRAVENOUS
  Filled 2021-03-17 (×4): qty 0.5

## 2021-03-17 MED ORDER — SERTRALINE HCL 50 MG PO TABS
50.0000 mg | ORAL_TABLET | Freq: Every day | ORAL | Status: DC
  Administered 2021-03-17 – 2021-03-22 (×6): 50 mg via ORAL
  Filled 2021-03-17 (×6): qty 1

## 2021-03-17 MED ORDER — FENTANYL CITRATE (PF) 100 MCG/2ML IJ SOLN
25.0000 ug | INTRAMUSCULAR | Status: DC | PRN
  Administered 2021-03-17 (×2): 50 ug via INTRAVENOUS

## 2021-03-17 MED ORDER — ACETAMINOPHEN 500 MG PO TABS
1000.0000 mg | ORAL_TABLET | Freq: Four times a day (QID) | ORAL | Status: DC
  Administered 2021-03-17 – 2021-03-22 (×17): 1000 mg via ORAL
  Filled 2021-03-17 (×19): qty 2

## 2021-03-17 MED ORDER — GABAPENTIN 300 MG PO CAPS
300.0000 mg | ORAL_CAPSULE | Freq: Two times a day (BID) | ORAL | Status: DC
  Administered 2021-03-17 – 2021-03-22 (×11): 300 mg via ORAL
  Filled 2021-03-17 (×11): qty 1

## 2021-03-17 MED ORDER — LOSARTAN POTASSIUM 50 MG PO TABS: 50.0000 mg | ORAL_TABLET | Freq: Every day | ORAL | Status: DC

## 2021-03-17 MED ORDER — LACTATED RINGERS IV SOLN: INTRAVENOUS | Status: DC

## 2021-03-17 MED ORDER — OXYCODONE HCL 5 MG/5ML PO SOLN: 5.0000 mg | Freq: Once | ORAL | Status: DC | PRN

## 2021-03-17 MED ORDER — ONDANSETRON HCL 4 MG/2ML IJ SOLN
4.0000 mg | Freq: Four times a day (QID) | INTRAMUSCULAR | Status: DC | PRN
  Administered 2021-03-17 – 2021-03-18 (×4): 4 mg via INTRAVENOUS
  Filled 2021-03-17 (×4): qty 2

## 2021-03-17 MED ORDER — METHOCARBAMOL 500 MG PO TABS
500.0000 mg | ORAL_TABLET | Freq: Three times a day (TID) | ORAL | Status: DC | PRN
  Administered 2021-03-18: 500 mg via ORAL
  Filled 2021-03-17: qty 1

## 2021-03-17 MED ORDER — ONDANSETRON 4 MG PO TBDP: 4.0000 mg | ORAL_TABLET | Freq: Four times a day (QID) | ORAL | Status: DC | PRN

## 2021-03-17 MED ORDER — OXYCODONE HCL 5 MG PO TABS
5.0000 mg | ORAL_TABLET | Freq: Once | ORAL | Status: DC | PRN
Start: 2021-03-17 — End: 2021-03-17

## 2021-03-17 MED ORDER — ASPIRIN 81 MG PO CHEW
81.0000 mg | CHEWABLE_TABLET | Freq: Every day | ORAL | Status: DC
  Administered 2021-03-17 – 2021-03-22 (×6): 81 mg via ORAL
  Filled 2021-03-17 (×6): qty 1

## 2021-03-17 MED ORDER — PANTOPRAZOLE SODIUM 40 MG PO TBEC
40.0000 mg | DELAYED_RELEASE_TABLET | Freq: Every day | ORAL | Status: DC
  Administered 2021-03-17 – 2021-03-19 (×3): 40 mg via ORAL
  Filled 2021-03-17 (×3): qty 1

## 2021-03-17 MED ORDER — INSULIN ASPART 100 UNIT/ML IJ SOLN
0.0000 [IU] | INTRAMUSCULAR | Status: DC
  Administered 2021-03-17: 7 [IU] via SUBCUTANEOUS
  Administered 2021-03-17: 4 [IU] via SUBCUTANEOUS
  Administered 2021-03-17: 7 [IU] via SUBCUTANEOUS
  Administered 2021-03-17: 11 [IU] via SUBCUTANEOUS
  Administered 2021-03-17 – 2021-03-18 (×3): 4 [IU] via SUBCUTANEOUS
  Administered 2021-03-18 (×2): 3 [IU] via SUBCUTANEOUS
  Administered 2021-03-18 – 2021-03-20 (×9): 4 [IU] via SUBCUTANEOUS
  Administered 2021-03-20 (×4): 3 [IU] via SUBCUTANEOUS
  Administered 2021-03-21 – 2021-03-22 (×5): 4 [IU] via SUBCUTANEOUS
  Administered 2021-03-22 (×2): 3 [IU] via SUBCUTANEOUS

## 2021-03-17 MED ORDER — OXYCODONE HCL 5 MG PO TABS
5.0000 mg | ORAL_TABLET | ORAL | Status: DC | PRN
  Administered 2021-03-17 (×4): 10 mg via ORAL
  Administered 2021-03-18: 5 mg via ORAL
  Administered 2021-03-18 – 2021-03-20 (×4): 10 mg via ORAL
  Administered 2021-03-20: 5 mg via ORAL
  Administered 2021-03-20: 10 mg via ORAL
  Administered 2021-03-21: 5 mg via ORAL
  Administered 2021-03-21: 10 mg via ORAL
  Filled 2021-03-17 (×5): qty 2
  Filled 2021-03-17: qty 1
  Filled 2021-03-17 (×3): qty 2
  Filled 2021-03-17: qty 1
  Filled 2021-03-17: qty 2
  Filled 2021-03-17: qty 1
  Filled 2021-03-17: qty 2
  Filled 2021-03-17 (×2): qty 1

## 2021-03-17 MED ORDER — ACETAMINOPHEN 10 MG/ML IV SOLN
INTRAVENOUS | Status: AC
  Filled 2021-03-17: qty 100

## 2021-03-17 MED ORDER — BACITRACIN ZINC 500 UNIT/GM EX OINT
TOPICAL_OINTMENT | Freq: Two times a day (BID) | CUTANEOUS | Status: DC
  Administered 2021-03-17: 1 via TOPICAL
  Filled 2021-03-17 (×2): qty 28.35

## 2021-03-17 MED ORDER — ALPRAZOLAM 0.5 MG PO TABS
0.5000 mg | ORAL_TABLET | Freq: Every evening | ORAL | Status: DC | PRN
  Administered 2021-03-17 – 2021-03-21 (×5): 0.5 mg via ORAL
  Filled 2021-03-17 (×5): qty 1

## 2021-03-17 MED ORDER — FENOFIBRATE 160 MG PO TABS
160.0000 mg | ORAL_TABLET | Freq: Every day | ORAL | Status: DC
  Administered 2021-03-17 – 2021-03-22 (×6): 160 mg via ORAL
  Filled 2021-03-17 (×6): qty 1

## 2021-03-17 MED ORDER — SERTRALINE HCL 50 MG PO TABS: 50.0000 mg | ORAL_TABLET | Freq: Every day | ORAL | Status: DC

## 2021-03-17 NOTE — Progress Notes (Signed)
[facility] Gastroenterology Progress Note  Janice Payne 75 y.o. Nov 29, 1945  CC:   Colonic perforation  Subjective: Patient seen and examined at bedside.  Underwent exploratory laparotomy with repair of long serosal tear last night.  Was sitting in a chair this morning without any acute distress.  Complaining of right-sided abdominal pain.  Son in the room.  ROS : Afebrile.  Negative for chest pain   Objective: Vital signs in last 24 hours: Vitals:   03/17/21 0358 03/17/21 0751  BP: 135/74 119/71  Pulse: 93 99  Resp: 18 20  Temp: 97.6 F (36.4 C) 98 F (36.7 C)  SpO2: 100% 99%    Physical Exam: General : Sitting comfortably, not in acute distress Abdomen : Generalized discomfort on palpation with more discomfort towards right lower quadrant, abdomen is soft, bowel sounds present but hypoactive.  No peritoneal signs  Lab Results: Recent Labs    03/16/21 1715 03/16/21 1725 03/17/21 0230  NA 136 137 136  K 3.9 3.8 4.0  CL 101 104 100  CO2 21*  --  25  GLUCOSE 235* 232* 267*  BUN '15 17 14  '$ CREATININE 1.62* 1.50* 1.46*  CALCIUM 9.5  --  8.9   Recent Labs    03/16/21 1715  AST 24  ALT 21  ALKPHOS 38  BILITOT 1.0  PROT 7.2  ALBUMIN 4.0   Recent Labs    03/16/21 1715 03/16/21 1725 03/17/21 0230  WBC 11.8*  --  9.0  NEUTROABS 10.2*  --   --   HGB 14.2 14.6 12.2  HCT 41.9 43.0 36.7  MCV 86.9  --  88.4  PLT 179  --  146*   No results for input(s): LABPROT, INR in the last 72 hours.    Assessment/Plan: -Colonic perforation after colonoscopy yesterday.  Underwent exploratory lap  with primary repair /oversewing of large serosal tear last night   Operative finding of large serosal tear without  full-thickness defect is probably from barotrauma rather than post polypectomy perforation.  Barotrauma might have happened because of difficult intubation and overall prolonged procedure duration.   Recommendations --------------------------- -Appreciate help from  surgical team -Continue management per surgical team -Operative findings discussed with patient and family at bedside -Hopefully discharge home in the next few days -GI will follow   Otis Brace MD, [place] 03/17/2021, 8:31 AM  Contact #  [phone]

## 2021-03-17 NOTE — Evaluation (Signed)
Physical Therapy Evaluation Patient Details Name: Janice Payne MRN: [id number] DOB: [birth year] Today's Date: 03/17/2021   History of Present Illness  S/p ex lap with primary repair of cecal injury from colonoscopy, Dr. Zenia Resides 7/26  Clinical Impression  Patient received in recliner, RN present in room. Patient son at bedside. She is agreeable to PT assessment. She reports abdominal pain with mobility. Transfers sit to stand with min guard and cues for hand placement, safety. She is able to ambulate 60 feet in room with RW and min guard. Patient limited by pain and weakness. She will continue to benefit from skilled PT while here to improve mobility and independence.     Follow Up Recommendations Home health PT;Supervision - Intermittent    Equipment Recommendations  Rolling walker with 5" wheels    Recommendations for Other Services       Precautions / Restrictions Precautions Precaution Comments: mod fall Restrictions Weight Bearing Restrictions: No      Mobility  Bed Mobility               General bed mobility comments: not assessed patient received in recliner and remained in recliner at end of session    Transfers Overall transfer level: Needs assistance Equipment used: Rolling walker (2 wheeled) Transfers: Sit to/from Stand Sit to Stand: Min guard         General transfer comment: cues for safety, hand placement  Ambulation/Gait Ambulation/Gait assistance: Min guard Gait Distance (Feet): 60 Feet Assistive device: Rolling walker (2 wheeled) Gait Pattern/deviations: Step-through pattern;Decreased step length - right;Decreased step length - left Gait velocity: decr   General Gait Details: patient ambulated in room with RW and min guard. Patient has unclear covid status at this time, therefore did not ambulate out in hallway.  Stairs            Wheelchair Mobility    Modified Rankin (Stroke Patients Only)       Balance Overall balance  assessment: Needs assistance Sitting-balance support: Feet supported Sitting balance-Leahy Scale: Good     Standing balance support: Bilateral upper extremity supported;During functional activity Standing balance-Leahy Scale: Fair Standing balance comment: reliant on B UE support and min guard                             Pertinent Vitals/Pain Pain Assessment: Faces Faces Pain Scale: Hurts little more Pain Location: abdomen Pain Descriptors / Indicators: Grimacing;Guarding;Moaning Pain Intervention(s): Monitored during session;Limited activity within patient's tolerance    Home Living Family/patient expects to be discharged to:: Private residence Living Arrangements: Children Available Help at Discharge: Family;Available PRN/intermittently Type of Home: House Home Access: Stairs to enter   Entrance Stairs-Number of Steps: 4 Home Layout: Able to live on main level with bedroom/bathroom;Two level Home Equipment: Walker - 2 wheels      Prior Function Level of Independence: Independent         Comments: was driving and independent pta     Hand Dominance        Extremity/Trunk Assessment   Upper Extremity Assessment Upper Extremity Assessment: Generalized weakness    Lower Extremity Assessment Lower Extremity Assessment: Generalized weakness    Cervical / Trunk Assessment Cervical / Trunk Assessment: Normal  Communication   Communication: No difficulties  Cognition Arousal/Alertness: Awake/alert Behavior During Therapy: WFL for tasks assessed/performed Overall Cognitive Status: Within Functional Limits for tasks assessed  General Comments      Exercises     Assessment/Plan    PT Assessment Patient needs continued PT services  PT Problem List Decreased strength;Decreased mobility;Decreased activity tolerance;Decreased balance;Pain;Decreased safety awareness       PT Treatment  Interventions DME instruction;Therapeutic exercise;Gait training;Balance training;Stair training;Functional mobility training;Therapeutic activities;Patient/family education    PT Goals (Current goals can be found in the Care Plan section)  Acute Rehab PT Goals Patient Stated Goal: to feel better, return home PT Goal Formulation: With patient/family Time For Goal Achievement: 03/31/21 Potential to Achieve Goals: Good    Frequency Min 3X/week   Barriers to discharge Inaccessible home environment      Co-evaluation               AM-PAC PT "6 Clicks" Mobility  Outcome Measure Help needed turning from your back to your side while in a flat bed without using bedrails?: A Little Help needed moving from lying on your back to sitting on the side of a flat bed without using bedrails?: A Little Help needed moving to and from a bed to a chair (including a wheelchair)?: A Little Help needed standing up from a chair using your arms (e.g., wheelchair or bedside chair)?: A Little Help needed to walk in hospital room?: A Little Help needed climbing 3-5 steps with a railing? : A Little 6 Click Score: 18    End of Session   Activity Tolerance: Patient limited by pain;Patient limited by fatigue Patient left: in chair;with call bell/phone within reach;with family/visitor present Nurse Communication: Mobility status PT Visit Diagnosis: Other abnormalities of gait and mobility (R26.89);Muscle weakness (generalized) (M62.81);Pain;Difficulty in walking, not elsewhere classified (R26.2) Pain - part of body:  (abdomen)    Time: [id number] PT Time Calculation (min) (ACUTE ONLY): 17 min   Charges:   PT Evaluation $PT Eval Moderate Complexity: 1 Mod PT Treatments $Gait Training: 8-22 mins        Daegan Arizmendi, PT, GCS 03/17/21,2:25 PM

## 2021-03-17 NOTE — Transfer of Care (Signed)
Immediate Anesthesia Transfer of Care Note  Patient: Janice Payne  Procedure(s) Performed: Diagnostic Laparoscopy, Exploratory Laparotomy with colon repair (Abdomen)  Patient Location: PACU  Anesthesia Type:General  Level of Consciousness: drowsy, patient cooperative and responds to stimulation  Airway & Oxygen Therapy: Patient Spontanous Breathing and Patient connected to nasal cannula oxygen  Post-op Assessment: Report given to RN and Post -op Vital signs reviewed and stable  Post vital signs: Reviewed and stable  Last Vitals:  Vitals Value Taken Time  BP 145/69   Temp    Pulse 82   Resp 16   SpO2 100     Last Pain:  Vitals:   03/16/21 1948  TempSrc:   PainSc: 6          Complications:  Encounter Notable Events  Notable Event Outcome Phase Comment  Difficult to intubate - expected  Intraprocedure Patient with poor airway anatomy on physical exam. Grade 3-4 view with Eyster 2 and Mac 3 blades. Grade 1 with glidescope. Mask ventillation was not attempted given acute abdomen.

## 2021-03-17 NOTE — Op Note (Addendum)
Date of Surgery: 03/16/21  Patient: Janice Payne MRN: [id number]  Preoperative Diagnosis: Colon perforation Postoperative Diagnosis: Serosal tear of cecum with likely microperforation  Procedure: Diagnostic laparoscopy Exploratory laparotomy with primary repair of colonic injury  Surgeon: Michaelle Birks, MD  EBL: 30 mL  Anesthesia: General endotracheal  Specimens: None  Indications: [name] is a 75 yo female who had an elective colonoscopy on 7/25. She was found to have a large pedunculated polyp in the cecum, which was resected and the defect closed with a clip. Following the procedure she had abdominal pain, which persisted over the next several hours. Abdominal XR showed intraperitoneal air. CT scan with rectal contrast confirmed the presence of large-volume pneumoperitoneum, but did not show any free fluid or definitive site of perforation. Given her persistent pain and abdominal distension, the decision was made to proceed with abdominal exploration.  Findings: Serosal tear on the cecum along the taenia, 10cm in length, with no visible full-thickness defect. Remaining colon and small bowel were normal in appearance. No intraabdominal succus or stool. Serosal tear was primarily oversewn in 2 layers.  Procedure details: Informed consent was obtained in the preoperative area prior to the procedure. The patient was brought to the operating room and placed on the table in the supine position. General anesthesia was induced and appropriate lines and drains were placed for intraoperative monitoring. Perioperative antibiotics were administered per SCIP guidelines. The abdomen was prepped and draped in the usual sterile fashion. A pre-procedure timeout was taken verifying patient identity, surgical site and procedure to be performed.  A small supraumbilical skin incision was made, the umbilical stalk was grasped and elevated, and a Veress needle was inserted through the fascia.  Intraperitoneal placement was confirmed with the saline drop test and the abdomen was insufflated. A 57m port was placed and the abdomen was inspected. The cecum was distended with an apparent serosal tear and intramural hematoma with a possible full-thickness injury. No succus, stool or free fluid was visible within the abdomen. The decision was made to open to further evaluate the cecal injury and perform a repair. The port was removed. A midline skin incision was made and the subcutaneous tissue was divided with cautery to expose the fascia. The fascia was incised along the linea alba and the peritoneal cavity was entered. A Bookwalter fixed retractor and Allexis wound protector were placed. The transverse colon, descending colon, sigmoid colon and upper rectum were normal in appearance without signs of injury. The stomach and proximal duodenum were normal in appearance. The small bowel was run from the ligament of Treitz to the terminal ileum and there were no injuries identified. Next the right colon was mobilized medially along the line of Toldt using cautery. The hepatic flexure was taken down. The duodenal sweep was visualized and was normal in appearance with no fluid in the lesser sac. The right colon was then closely examined. There was a serosal tear on the antimesenteric border of the cecum along the taenia. At the most proximal aspect of this serosal injury appeared to be a deeper defect, but no clear full-thickness injury. There was no leakage of stool. The surrounding tissues appeared very healthy and well perfused, so the decision was made to proceed with a primary repair. The entire serosal injury was oversewn with 3-0 Vicryl Lembert sutures, followed by a second layer of 3-0 silk Lembert sutures. This did not diminish the lumen of the cecum. The abdomen was irrigated and hemostasis was achieved. The retractor and  wound protector were removed and the fascia was closed with a running looped 1 PDS.  Scarpa's layer was closed with a running 3-0 Vicryl suture, and the skin was closed with a running subcuticular 4-0 monocryl suture. Dermabond was applied.  The patient tolerated the procedure with no apparent complications. All counts were correct x2 at the end of the procedure. The patient was extubated and taken to PACU in stable condition.  Michaelle Birks, MD 03/17/21 1:33 AM

## 2021-03-17 NOTE — Progress Notes (Signed)
1 Day Post-Op  Subjective: Patient having some pain currently.  No flatus.  No real nausea.  Hasn't had anything to drink yet.  Voiding in [place].  Son in the room.  ROS: See above, otherwise other systems negative, except right ear lobe infection from her earring.  Objective: Vital signs in last 24 hours: Temp:  [96.9 F (36.1 C)-98.4 F (36.9 C)] 98 F (36.7 C) (07/26 0751) Pulse Rate:  [78-102] 99 (07/26 0751) Resp:  [12-22] 20 (07/26 0751) BP: (119-158)/(56-97) 119/71 (07/26 0751) SpO2:  [96 %-100 %] 99 % (07/26 0751) Last BM Date: 03/16/21  Intake/Output from previous day: 07/25 0701 - 07/26 0700 In: 2113.3 [I.V.:2000; IV Piggyback:113.3] Out: 530 [Urine:500; Blood:30] Intake/Output this shift: No intake/output data recorded.  PE: HEENT: right ear lobe with cellulitis Heart: regular Lungs: CTAB Abd: soft, appropriately tender, hypoactive, but few BS, incision c/d/I with dermabond present  Lab Results:  Recent Labs    03/16/21 1715 03/16/21 1725 03/17/21 0230  WBC 11.8*  --  9.0  HGB 14.2 14.6 12.2  HCT 41.9 43.0 36.7  PLT 179  --  146*   BMET Recent Labs    03/16/21 1715 03/16/21 1725 03/17/21 0230  NA 136 137 136  K 3.9 3.8 4.0  CL 101 104 100  CO2 21*  --  25  GLUCOSE 235* 232* 267*  BUN '15 17 14  '$ CREATININE 1.62* 1.50* 1.46*  CALCIUM 9.5  --  8.9   PT/INR No results for input(s): LABPROT, INR in the last 72 hours. CMP     Component Value Date/Time   NA 136 03/17/2021 0230   K 4.0 03/17/2021 0230   CL 100 03/17/2021 0230   CO2 25 03/17/2021 0230   GLUCOSE 267 (H) 03/17/2021 0230   BUN 14 03/17/2021 0230   CREATININE 1.46 (H) 03/17/2021 0230   CALCIUM 8.9 03/17/2021 0230   PROT 7.2 03/16/2021 1715   ALBUMIN 4.0 03/16/2021 1715   AST 24 03/16/2021 1715   ALT 21 03/16/2021 1715   ALKPHOS 38 03/16/2021 1715   BILITOT 1.0 03/16/2021 1715   GFRNONAA 37 (L) 03/17/2021 0230   GFRAA 63 (L) 11/27/2012 0915   Lipase  No results found  for: LIPASE     Studies/Results: CT ABDOMEN PELVIS WO CONTRAST  Result Date: 03/16/2021 CLINICAL DATA:  Intraperitoneal free air none on radiograph underwent screening colonoscopy after an acute episode of diverticulitis several months prior. Fall ectomy performed. EXAM: CT ABDOMEN AND PELVIS WITHOUT CONTRAST TECHNIQUE: Multidetector CT imaging of the abdomen and pelvis was performed following the standard protocol without IV contrast. COMPARISON:  CT 01/30/2014 FINDINGS: Lower chest: Atelectatic changes otherwise clear lung bases. Normal heart size. No pericardial effusion. Coronary calcifications are noted as well as additional calcifications on the aortic leaflets. Hepatobiliary: No worrisome focal liver lesions. Smooth liver surface contour. Normal hepatic attenuation. Normal gallbladder and biliary tree. Pancreas: No pancreatic ductal dilatation or surrounding inflammatory changes. Spleen: Normal in size. No concerning splenic lesions. Adrenals/Urinary Tract: Normal adrenals. Mild bilateral renal atrophy. Two small 11 mm hypoattenuating foci in the lower pole right kidney, favor small cysts. Additional probable parapelvic cyst on the left, too small to fully characterize. No concerning visible or contour deforming renal lesion. Mild symmetric bilateral perinephric stranding, a nonspecific finding which may correlate with advanced age or decreased renal function. No obstructive urolithiasis or hydronephrosis. Stomach/Bowel: Distal esophagus and stomach are unremarkable. Air and fluid-filled duodenal diverticula seen extending posteriorly from the second and  third portions of the duodenum measuring up to 3.5 and 2.5 cm respectively. No associated inflammation. Duodenum is otherwise unremarkable. No small bowel thickening or dilatation. Normal appendix seen in the right lower quadrant extending retrocecal E from the cecal tip. Few foci of adjacent gas albeit without spillage of fluid, or adjacent  inflammation to suggest perforation at this site. Surgical clip noted at the tip of the cecum (3/66) correlate with procedure report for the presumed site of of polypectomy performed today. While there is gas around much of the cecum, no spillage of contrast or focal inflammation is evident to suggest perforation at this level either. Colon is diffusely fluid-filled with dilute enteric contrast media. No discernible sites of contrast spillage, colonic inflammation nor inflammation on the additional numerous, predominantly distal colonic diverticula. Vascular/Lymphatic: Atherosclerotic calcifications within the abdominal aorta and branch vessels. No aneurysm or ectasia. No enlarged abdominopelvic lymph nodes. Reproductive: Anteverted, fibroid uterus with multiple partially calcified uterine fibroids. No concerning adnexal mass or lesion. Other: Large volume of pneumoperitoneum. No free fluid or spillage of administered enteric contrast media. No organized collection or abscess. Musculoskeletal: No acute osseous abnormality or suspicious osseous lesion. Multilevel degenerative changes are present in the imaged portions of the spine. IMPRESSION: Metallic clip noted at the cecum, likely to correspond to site of patient's same day polypectomy. While a small micro perforation could exist in this vicinity, no discrete CT abnormality to is seen in this vicinity or elsewhere to provide a distinct identifiable source of the patient's large volume of pneumoperitoneum. No other worrisome features to suggest spillage of bowel succus or a frank peritonitis. Recommend correlation with clinical exam findings and patient clinical status. Diverticulosis without evidence acute diverticulitis. Fibroid uterus. Aortic Atherosclerosis (ICD10-I70.0). Electronically Signed   By: Lovena Le M.D.   On: 03/16/2021 21:15   DG Abd 2 Views  Result Date: 03/16/2021 CLINICAL DATA:  Abdominal pain.  Status post colonoscopy today. EXAM: ABDOMEN  - 2 VIEW COMPARISON:  None. FINDINGS: No evidence of dilated bowel loops, however a moderate to large amount of free intraperitoneal air is seen, consistent with bowel perforation. IMPRESSION: Moderate to large amount of free intraperitoneal air, consistent with bowel perforation. No evidence of bowel obstruction. These results were called by telephone at the time of interpretation on 03/16/2021 at 4:26 pm to provider Dr. Gilmore Laroche nurse Judeen Hammans , who verbally acknowledged these results. Electronically Signed   By: Marlaine Hind M.D.   On: 03/16/2021 16:28    Anti-infectives: Anti-infectives (From admission, onward)    Start     Dose/Rate Route Frequency Ordered Stop   03/16/21 2200  ceFEPIme (MAXIPIME) 2 g in sodium chloride 0.9 % 100 mL IVPB  Status:  Discontinued        2 g 200 mL/hr over 30 Minutes Intravenous Every 12 hours 03/16/21 1800 03/17/21 0014   03/16/21 1815  metroNIDAZOLE (FLAGYL) IVPB 500 mg  Status:  Discontinued        500 mg 100 mL/hr over 60 Minutes Intravenous Every 8 hours 03/16/21 1800 03/17/21 0014        Assessment/Plan POD 0, s/p ex lap with primary repair of cecal injury from colonoscopy, Dr. Zenia Resides 7/26 -CLD today -mobilize, pulm toilet -multi-modal pain control  FEN - CLD/IVFs VTE - heparin ID - pre-op dose  R ear lobe infection - bacitracin ointment BID DM - SSI, resistant HTN - resume home metoprolol, hold HCTZ/Cozaar with slight bump in kidney function AKI - fluids, recheck in am  LOS: 1 day    Henreitta Cea , [facility] Surgery 03/17/2021, 10:12 AM Please see Amion for pager number during day hours 7:00am-4:30pm or 7:00am -11:30am on weekends

## 2021-03-17 NOTE — Progress Notes (Signed)
Inpatient Diabetes Program Recommendations  AACE/ADA: New Consensus Statement on Inpatient Glycemic Control (2015)  Target Ranges:  Prepandial:   less than 140 mg/dL      Peak postprandial:   less than 180 mg/dL (1-2 hours)      Critically ill patients:  140 - 180 mg/dL   Lab Results  Component Value Date   GLUCAP 229 (H) 03/17/2021    Review of Glycemic Control Results for Janice Payne, Janice Payne (MRN [id number]) as of 03/17/2021 10:23  Ref. Range 03/17/2021 00:10 03/17/2021 03:55 03/17/2021 07:54  Glucose-Capillary Latest Ref Range: 70 - 99 mg/dL 215 (H) 275 (H) 229 (H)   Diabetes history: Type 2 DM Outpatient Diabetes medications: Novolin 70/30 72 units QAM, 30 units QHS, Metformin 1000 mg QD, Amaryl 4 mg QD Current orders for Inpatient glycemic control: Novolog 0-20 units Q4H Decadron 10 mg x 1  Inpatient Diabetes Program Recommendations:    Consider adding Lantus 10 units QD.  Thanks, Bronson Curb, MSN, RNC-OB Diabetes Coordinator [phone] (8a-5p)

## 2021-03-18 ENCOUNTER — Inpatient Hospital Stay (HOSPITAL_COMMUNITY): Payer: Medicare Other

## 2021-03-18 LAB — CBC
HCT: 37.1 % (ref 36.0–46.0)
Hemoglobin: 12.4 g/dL (ref 12.0–15.0)
MCH: 28.8 pg (ref 26.0–34.0)
MCHC: 33.4 g/dL (ref 30.0–36.0)
MCV: 86.1 fL (ref 80.0–100.0)
Platelets: 137 10*3/uL — ABNORMAL LOW (ref 150–400)
RBC: 4.31 MIL/uL (ref 3.87–5.11)
RDW: 13 % (ref 11.5–15.5)
WBC: 12.7 10*3/uL — ABNORMAL HIGH (ref 4.0–10.5)
nRBC: 0 % (ref 0.0–0.2)

## 2021-03-18 LAB — HEMOGLOBIN A1C
Hgb A1c MFr Bld: 7.3 % — ABNORMAL HIGH (ref 4.8–5.6)
Mean Plasma Glucose: 163 mg/dL

## 2021-03-18 LAB — GLUCOSE, CAPILLARY
Glucose-Capillary: 140 mg/dL — ABNORMAL HIGH (ref 70–99)
Glucose-Capillary: 183 mg/dL — ABNORMAL HIGH (ref 70–99)
Glucose-Capillary: 183 mg/dL — ABNORMAL HIGH (ref 70–99)
Glucose-Capillary: 154 mg/dL — ABNORMAL HIGH (ref 70–99)
Glucose-Capillary: 177 mg/dL — ABNORMAL HIGH (ref 70–99)

## 2021-03-18 LAB — BASIC METABOLIC PANEL
Anion gap: 10 (ref 5–15)
BUN: 16 mg/dL (ref 8–23)
CO2: 26 mmol/L (ref 22–32)
Calcium: 8.4 mg/dL — ABNORMAL LOW (ref 8.9–10.3)
Chloride: 95 mmol/L — ABNORMAL LOW (ref 98–111)
Creatinine, Ser: 1.67 mg/dL — ABNORMAL HIGH (ref 0.44–1.00)
GFR, Estimated: 32 mL/min — ABNORMAL LOW (ref >60)
Glucose, Bld: 162 mg/dL — ABNORMAL HIGH (ref 70–99)
Potassium: 3.6 mmol/L (ref 3.5–5.1)
Sodium: 131 mmol/L — ABNORMAL LOW (ref 135–145)

## 2021-03-18 MED ORDER — GUAIFENESIN 200 MG PO TABS
400.0000 mg | ORAL_TABLET | Freq: Four times a day (QID) | ORAL | Status: DC | PRN
  Administered 2021-03-18: 400 mg via ORAL
  Filled 2021-03-18 (×2): qty 2

## 2021-03-18 MED ORDER — INSULIN GLARGINE-YFGN 100 UNIT/ML ~~LOC~~ SOLN
10.0000 [IU] | Freq: Every day | SUBCUTANEOUS | Status: DC
  Administered 2021-03-18: 10 [IU] via SUBCUTANEOUS
  Filled 2021-03-18 (×2): qty 0.1

## 2021-03-18 NOTE — Progress Notes (Signed)
2 Days Post-Op  Subjective: Patient having some pain as expected.  Tried to mobilize some yesterday and did so with PT.  Some nausea.  No BM yet.    ROS: See above, otherwise other systems negative.  Objective: Vital signs in last 24 hours: Temp:  [97.5 F (36.4 C)-98.2 F (36.8 C)] 98.2 F (36.8 C) (07/27 0750) Pulse Rate:  [69-100] 100 (07/27 0750) Resp:  [16-22] 20 (07/27 0750) BP: (124-150)/(62-77) 140/66 (07/27 0750) SpO2:  [95 %-100 %] 96 % (07/27 0750) Last BM Date: 03/16/21  Intake/Output from previous day: 07/26 0701 - 07/27 0700 In: 3144.9 [P.O.:960; I.V.:2184.9] Out: -  Intake/Output this shift: No intake/output data recorded.  PE: HEENT: right ear lobe cellulitis much improved today Heart: regular Lungs: CTAB Abd: soft, seems slightly more distended today although she denies, appropriately tender, hypoactive, but few BS, incision c/d/I with dermabond present.  Ecchymosis as expected  Lab Results:  Recent Labs    03/17/21 0230 03/18/21 0030  WBC 9.0 12.7*  HGB 12.2 12.4  HCT 36.7 37.1  PLT 146* 137*   BMET Recent Labs    03/17/21 0230 03/18/21 0434  NA 136 131*  K 4.0 3.6  CL 100 95*  CO2 25 26  GLUCOSE 267* 162*  BUN 14 16  CREATININE 1.46* 1.67*  CALCIUM 8.9 8.4*   PT/INR No results for input(s): LABPROT, INR in the last 72 hours. CMP     Component Value Date/Time   NA 131 (L) 03/18/2021 0434   K 3.6 03/18/2021 0434   CL 95 (L) 03/18/2021 0434   CO2 26 03/18/2021 0434   GLUCOSE 162 (H) 03/18/2021 0434   BUN 16 03/18/2021 0434   CREATININE 1.67 (H) 03/18/2021 0434   CALCIUM 8.4 (L) 03/18/2021 0434   PROT 7.2 03/16/2021 1715   ALBUMIN 4.0 03/16/2021 1715   AST 24 03/16/2021 1715   ALT 21 03/16/2021 1715   ALKPHOS 38 03/16/2021 1715   BILITOT 1.0 03/16/2021 1715   GFRNONAA 32 (L) 03/18/2021 0434   GFRAA 63 (L) 11/27/2012 0915   Lipase  No results found for: LIPASE     Studies/Results: CT ABDOMEN PELVIS WO  CONTRAST  Result Date: 03/16/2021 CLINICAL DATA:  Intraperitoneal free air none on radiograph underwent screening colonoscopy after an acute episode of diverticulitis several months prior. Fall ectomy performed. EXAM: CT ABDOMEN AND PELVIS WITHOUT CONTRAST TECHNIQUE: Multidetector CT imaging of the abdomen and pelvis was performed following the standard protocol without IV contrast. COMPARISON:  CT 01/30/2014 FINDINGS: Lower chest: Atelectatic changes otherwise clear lung bases. Normal heart size. No pericardial effusion. Coronary calcifications are noted as well as additional calcifications on the aortic leaflets. Hepatobiliary: No worrisome focal liver lesions. Smooth liver surface contour. Normal hepatic attenuation. Normal gallbladder and biliary tree. Pancreas: No pancreatic ductal dilatation or surrounding inflammatory changes. Spleen: Normal in size. No concerning splenic lesions. Adrenals/Urinary Tract: Normal adrenals. Mild bilateral renal atrophy. Two small 11 mm hypoattenuating foci in the lower pole right kidney, favor small cysts. Additional probable parapelvic cyst on the left, too small to fully characterize. No concerning visible or contour deforming renal lesion. Mild symmetric bilateral perinephric stranding, a nonspecific finding which may correlate with advanced age or decreased renal function. No obstructive urolithiasis or hydronephrosis. Stomach/Bowel: Distal esophagus and stomach are unremarkable. Air and fluid-filled duodenal diverticula seen extending posteriorly from the second and third portions of the duodenum measuring up to 3.5 and 2.5 cm respectively. No associated inflammation. Duodenum is otherwise  unremarkable. No small bowel thickening or dilatation. Normal appendix seen in the right lower quadrant extending retrocecal E from the cecal tip. Few foci of adjacent gas albeit without spillage of fluid, or adjacent inflammation to suggest perforation at this site. Surgical clip noted  at the tip of the cecum (3/66) correlate with procedure report for the presumed site of of polypectomy performed today. While there is gas around much of the cecum, no spillage of contrast or focal inflammation is evident to suggest perforation at this level either. Colon is diffusely fluid-filled with dilute enteric contrast media. No discernible sites of contrast spillage, colonic inflammation nor inflammation on the additional numerous, predominantly distal colonic diverticula. Vascular/Lymphatic: Atherosclerotic calcifications within the abdominal aorta and branch vessels. No aneurysm or ectasia. No enlarged abdominopelvic lymph nodes. Reproductive: Anteverted, fibroid uterus with multiple partially calcified uterine fibroids. No concerning adnexal mass or lesion. Other: Large volume of pneumoperitoneum. No free fluid or spillage of administered enteric contrast media. No organized collection or abscess. Musculoskeletal: No acute osseous abnormality or suspicious osseous lesion. Multilevel degenerative changes are present in the imaged portions of the spine. IMPRESSION: Metallic clip noted at the cecum, likely to correspond to site of patient's same day polypectomy. While a small micro perforation could exist in this vicinity, no discrete CT abnormality to is seen in this vicinity or elsewhere to provide a distinct identifiable source of the patient's large volume of pneumoperitoneum. No other worrisome features to suggest spillage of bowel succus or a frank peritonitis. Recommend correlation with clinical exam findings and patient clinical status. Diverticulosis without evidence acute diverticulitis. Fibroid uterus. Aortic Atherosclerosis (ICD10-I70.0). Electronically Signed   By: Lovena Le M.D.   On: 03/16/2021 21:15   DG Abd 2 Views  Result Date: 03/16/2021 CLINICAL DATA:  Abdominal pain.  Status post colonoscopy today. EXAM: ABDOMEN - 2 VIEW COMPARISON:  None. FINDINGS: No evidence of dilated bowel  loops, however a moderate to large amount of free intraperitoneal air is seen, consistent with bowel perforation. IMPRESSION: Moderate to large amount of free intraperitoneal air, consistent with bowel perforation. No evidence of bowel obstruction. These results were called by telephone at the time of interpretation on 03/16/2021 at 4:26 pm to provider Dr. Gilmore Laroche nurse Judeen Hammans , who verbally acknowledged these results. Electronically Signed   By: Marlaine Hind M.D.   On: 03/16/2021 16:28    Anti-infectives: Anti-infectives (From admission, onward)    Start     Dose/Rate Route Frequency Ordered Stop   03/16/21 2200  ceFEPIme (MAXIPIME) 2 g in sodium chloride 0.9 % 100 mL IVPB  Status:  Discontinued        2 g 200 mL/hr over 30 Minutes Intravenous Every 12 hours 03/16/21 1800 03/17/21 0014   03/16/21 1815  metroNIDAZOLE (FLAGYL) IVPB 500 mg  Status:  Discontinued        500 mg 100 mL/hr over 60 Minutes Intravenous Every 8 hours 03/16/21 1800 03/17/21 0014        Assessment/Plan POD 1, s/p ex lap with primary repair of cecal injury from colonoscopy, Dr. Zenia Resides 7/26 -CLD and take it show given some nausea -mobilize, pulm toilet -multi-modal pain control  FEN - CLD/IVFs VTE - heparin ID - pre-op dose  R ear lobe infection - bacitracin ointment BID DM - SSI, resistant, add 10 units of lantus per DM coordinator.  Appreciate their assistance with this patient. HTN - resume home metoprolol, add back HCTZ/Cozaar pending Cr AKI - fluids, stable from admit at  1.67.  unclear baseline   LOS: 2 days    Henreitta Cea , [facility] Surgery 03/18/2021, 10:03 AM Please see Amion for pager number during day hours 7:00am-4:30pm or 7:00am -11:30am on weekends

## 2021-03-18 NOTE — Progress Notes (Signed)
Patient seen and examined at bedside.  Family at bedside.  Continues to have generalized abdominal discomfort.  Had some nausea.  Passing gas.  No bowel movement since admission.  On physical exam, resting comfortably.  Not in acute distress.  Midline and right lower quadrant abdominal tenderness to palpation, abdomen is soft.  No peritoneal signs.  Bowel sounds present but hypoactive.  Labs reviewed.  Mild elevated white count.  Mild elevated creatinine.  -Continue management per surgical team -If no bowel movement by tomorrow morning, recommend adding MiraLAX depending on abdominal x-ray finding. -Repeat labs in the morning.  Consider adding antibiotics if persistent leukocytosis. -GI will follow.  Otis Brace MD, [place] 03/18/2021, 4:30 PM  Contact #  [phone]

## 2021-03-19 LAB — CBC
HCT: 32.3 % — ABNORMAL LOW (ref 36.0–46.0)
Hemoglobin: 10.9 g/dL — ABNORMAL LOW (ref 12.0–15.0)
MCH: 29.1 pg (ref 26.0–34.0)
MCHC: 33.7 g/dL (ref 30.0–36.0)
MCV: 86.4 fL (ref 80.0–100.0)
Platelets: 156 10*3/uL (ref 150–400)
RBC: 3.74 MIL/uL — ABNORMAL LOW (ref 3.87–5.11)
RDW: 12.8 % (ref 11.5–15.5)
WBC: 7.9 10*3/uL (ref 4.0–10.5)
nRBC: 0 % (ref 0.0–0.2)

## 2021-03-19 LAB — BASIC METABOLIC PANEL
Anion gap: 8 (ref 5–15)
BUN: 15 mg/dL (ref 8–23)
CO2: 26 mmol/L (ref 22–32)
Calcium: 8.4 mg/dL — ABNORMAL LOW (ref 8.9–10.3)
Chloride: 96 mmol/L — ABNORMAL LOW (ref 98–111)
Creatinine, Ser: 1.41 mg/dL — ABNORMAL HIGH (ref 0.44–1.00)
GFR, Estimated: 39 mL/min — ABNORMAL LOW (ref >60)
Glucose, Bld: 157 mg/dL — ABNORMAL HIGH (ref 70–99)
Potassium: 3.5 mmol/L (ref 3.5–5.1)
Sodium: 130 mmol/L — ABNORMAL LOW (ref 135–145)

## 2021-03-19 LAB — GLUCOSE, CAPILLARY
Glucose-Capillary: 155 mg/dL — ABNORMAL HIGH (ref 70–99)
Glucose-Capillary: 159 mg/dL — ABNORMAL HIGH (ref 70–99)
Glucose-Capillary: 166 mg/dL — ABNORMAL HIGH (ref 70–99)
Glucose-Capillary: 174 mg/dL — ABNORMAL HIGH (ref 70–99)
Glucose-Capillary: 191 mg/dL — ABNORMAL HIGH (ref 70–99)
Glucose-Capillary: 192 mg/dL — ABNORMAL HIGH (ref 70–99)

## 2021-03-19 MED ORDER — INSULIN ASPART PROT & ASPART (70-30 MIX) 100 UNIT/ML ~~LOC~~ SUSP
30.0000 [IU] | Freq: Every day | SUBCUTANEOUS | Status: DC
  Administered 2021-03-19: 30 [IU] via SUBCUTANEOUS

## 2021-03-19 MED ORDER — HYDROCHLOROTHIAZIDE 25 MG PO TABS
12.5000 mg | ORAL_TABLET | Freq: Every day | ORAL | Status: DC
  Administered 2021-03-19 – 2021-03-22 (×4): 12.5 mg via ORAL
  Filled 2021-03-19 (×4): qty 1

## 2021-03-19 MED ORDER — INSULIN ASPART PROT & ASPART (70-30 MIX) 100 UNIT/ML ~~LOC~~ SUSP
72.0000 [IU] | Freq: Every day | SUBCUTANEOUS | Status: DC
  Administered 2021-03-20 – 2021-03-21 (×2): 72 [IU] via SUBCUTANEOUS
  Filled 2021-03-19: qty 10

## 2021-03-19 MED ORDER — WHITE PETROLATUM EX OINT
TOPICAL_OINTMENT | CUTANEOUS | Status: AC
  Administered 2021-03-19: 1
  Filled 2021-03-19: qty 28.35

## 2021-03-19 MED ORDER — PANTOPRAZOLE SODIUM 40 MG IV SOLR
40.0000 mg | Freq: Every day | INTRAVENOUS | Status: DC
  Administered 2021-03-20 – 2021-03-22 (×3): 40 mg via INTRAVENOUS
  Filled 2021-03-19 (×3): qty 40

## 2021-03-19 MED ORDER — POLYETHYLENE GLYCOL 3350 17 G PO PACK
17.0000 g | PACK | Freq: Every day | ORAL | Status: DC
  Administered 2021-03-19 – 2021-03-22 (×4): 17 g via ORAL
  Filled 2021-03-19 (×4): qty 1

## 2021-03-19 MED ORDER — LOSARTAN POTASSIUM 50 MG PO TABS
50.0000 mg | ORAL_TABLET | Freq: Every day | ORAL | Status: DC
  Administered 2021-03-20 – 2021-03-22 (×3): 50 mg via ORAL
  Filled 2021-03-19 (×3): qty 1

## 2021-03-19 NOTE — Progress Notes (Signed)
3 Days Post-Op  Subjective: Looks much better today.  No nausea.  Tolerating CLD.  Some flatus.  No BM yet, but no unexpected given she had a colonoscopy prep and hasn't eaten yet.  ROS: See above, otherwise other systems negative.  Objective: Vital signs in last 24 hours: Temp:  [98 F (36.7 C)-98.6 F (37 C)] 98.1 F (36.7 C) (07/28 0808) Pulse Rate:  [84-96] 93 (07/28 0808) Resp:  [17-20] 18 (07/28 0808) BP: (128-158)/(58-77) 158/77 (07/28 0808) SpO2:  [95 %-98 %] 96 % (07/28 0808) Last BM Date: 03/16/21  Intake/Output from previous day: 07/27 0701 - 07/28 0700 In: 621.3 [I.V.:621.3] Out: -  Intake/Output this shift: No intake/output data recorded.  PE: HEENT: right ear lobe cellulitis much improved today Heart: regular Lungs: CTAB Abd: softer today, obese, less distended, +BS, incision c/d/I, appropriately tender  Lab Results:  Recent Labs    03/18/21 0030 03/19/21 0045  WBC 12.7* 7.9  HGB 12.4 10.9*  HCT 37.1 32.3*  PLT 137* 156   BMET Recent Labs    03/18/21 0434 03/19/21 0045  NA 131* 130*  K 3.6 3.5  CL 95* 96*  CO2 26 26  GLUCOSE 162* 157*  BUN 16 15  CREATININE 1.67* 1.41*  CALCIUM 8.4* 8.4*   PT/INR No results for input(s): LABPROT, INR in the last 72 hours. CMP     Component Value Date/Time   NA 130 (L) 03/19/2021 0045   K 3.5 03/19/2021 0045   CL 96 (L) 03/19/2021 0045   CO2 26 03/19/2021 0045   GLUCOSE 157 (H) 03/19/2021 0045   BUN 15 03/19/2021 0045   CREATININE 1.41 (H) 03/19/2021 0045   CALCIUM 8.4 (L) 03/19/2021 0045   PROT 7.2 03/16/2021 1715   ALBUMIN 4.0 03/16/2021 1715   AST 24 03/16/2021 1715   ALT 21 03/16/2021 1715   ALKPHOS 38 03/16/2021 1715   BILITOT 1.0 03/16/2021 1715   GFRNONAA 39 (L) 03/19/2021 0045   GFRAA 63 (L) 11/27/2012 0915   Lipase  No results found for: LIPASE     Studies/Results: DG Abd Portable 1V  Result Date: 03/18/2021 CLINICAL DATA:  75 year old female with abdominal distension.  EXAM: PORTABLE ABDOMEN - 1 VIEW COMPARISON:  CT abdomen pelvis dated 03/16/2021. FINDINGS: Evaluation is very limited due to body habitus. No definite bowel dilatation. Air is noted throughout the colon. A 15 mm linear radiopaque structure over the right hemipelvis may correspond to the polypectomy clips. The osseous structures are intact. IMPRESSION: No definite bowel dilatation. Electronically Signed   By: Anner Crete M.D.   On: 03/18/2021 19:43    Anti-infectives: Anti-infectives (From admission, onward)    Start     Dose/Rate Route Frequency Ordered Stop   03/16/21 2200  ceFEPIme (MAXIPIME) 2 g in sodium chloride 0.9 % 100 mL IVPB  Status:  Discontinued        2 g 200 mL/hr over 30 Minutes Intravenous Every 12 hours 03/16/21 1800 03/17/21 0014   03/16/21 1815  metroNIDAZOLE (FLAGYL) IVPB 500 mg  Status:  Discontinued        500 mg 100 mL/hr over 60 Minutes Intravenous Every 8 hours 03/16/21 1800 03/17/21 0014        Assessment/Plan POD 2, s/p ex lap with primary repair of cecal injury from colonoscopy, Dr. Zenia Resides 7/26 -adv FLD today -mobilize, pulm toilet -multi-modal pain control -SLIV  FEN - FLD VTE - heparin ID - pre-op dose  R ear lobe infection - bacitracin  ointment BID DM - SSI, now that she is eating more, will restart her home meds.  Appreciate their assistance with this patient. HTN - resume home metoprolol, add back HCTZ/Cozaar  AKI - cr 1.4 today.  Unclear baseline  seems to be voiding well, but will have them document actual output   LOS: 3 days    Henreitta Cea , [facility] 03/19/2021, 8:50 AM Please see Amion for pager number during day hours 7:00am-4:30pm or 7:00am -11:30am on weekends

## 2021-03-19 NOTE — Progress Notes (Addendum)
Patient seen and examined at bedside.  Family at bedside.  Abdominal discomfort has improved but not completely resolved.  Denies nausea and vomiting.  Complaining of acid reflux.  Passing gas.  No bowel movement since admission.  On physical exam, sitting comfortably.  Not in acute distress.  Generalized discomfort on palpation, abdomen is soft.  No peritoneal signs.  Bowel sounds present  Labs reviewed.  Normal white count.  X-ray findings reviewed.  -Continue management per surgical team -Add MiraLAX  -Change Protonix to IV -Diet has been advanced to full liquid -GI will follow.  Otis Brace MD, [place] 03/19/2021, 11:40 AM  Contact #  [phone]

## 2021-03-19 NOTE — Care Management Important Message (Signed)
Important Message  Patient Details  Name: Janice Payne MRN: [id number] Date of Birth: [birth year]   Medicare Important Message Given:  Yes     Orbie Pyo 03/19/2021, 2:47 PM

## 2021-03-19 NOTE — Progress Notes (Signed)
Physical Therapy Treatment Patient Details Name: Janice Payne MRN: [id number] DOB: 1945-12-25 Today's Date: 03/19/2021    History of Present Illness 75 yo S/p ex lap with primary repair of cecal injury from colonoscopy, Dr. Zenia Resides 7/26    PT Comments    Pt was seen for mobility to prepare for discharge home, with stair training completed.  Her family is in to observe her and discuss the safety issues, including taking steps one at a time for down and may alternate up.  Follow along for PT goals as ordered, focusing on safety with balance and setting safe self limits of distance.   Follow Up Recommendations  Home health PT;Supervision - Intermittent     Equipment Recommendations  Rolling walker with 5" wheels    Recommendations for Other Services       Precautions / Restrictions Precautions Precautions: Fall;Other (comment) Precaution Comments: abd surgery precautions Restrictions Weight Bearing Restrictions: No    Mobility  Bed Mobility               General bed mobility comments: up inchair when PT arrived    Transfers Overall transfer level: Needs assistance Equipment used: Rolling walker (2 wheeled);1 person hand held assist Transfers: Sit to/from Stand Sit to Stand: Min guard            Ambulation/Gait Ambulation/Gait assistance: Min guard Gait Distance (Feet): 150 Feet Assistive device: Rolling walker (2 wheeled) Gait Pattern/deviations: Step-through pattern;Wide base of support;Decreased stride length Gait velocity: decreased Gait velocity interpretation: <1.31 ft/sec, indicative of household ambulator General Gait Details: pt is being permitted to walk on the hallways with nursing aware   Stairs Stairs: Yes Stairs assistance: Min guard Stair Management: One rail Right;One rail Left;Forwards;Step to pattern;Alternating pattern Number of Stairs: 5 General stair comments: steps alternating up and step to coming down   Wheelchair Mobility     Modified Rankin (Stroke Patients Only)       Balance Overall balance assessment: Needs assistance Sitting-balance support: Feet supported Sitting balance-Leahy Scale: Good       Standing balance-Leahy Scale: Fair                              Cognition Arousal/Alertness: Awake/alert Behavior During Therapy: WFL for tasks assessed/performed Overall Cognitive Status: Within Functional Limits for tasks assessed                                 General Comments: walking with her family member on the hall      Exercises General Exercises - Lower Extremity Ankle Circles/Pumps: AAROM;AROM;5 reps Quad Sets: AROM;10 reps Gluteal Sets: AROM;10 reps    General Comments General comments (skin integrity, edema, etc.): pt is walking with family down the hallways all morning      Pertinent Vitals/Pain Pain Assessment: No/denies pain    Home Living                      Prior Function            PT Goals (current goals can now be found in the care plan section) Acute Rehab PT Goals Patient Stated Goal: to feel better, return home Progress towards PT goals: Progressing toward goals    Frequency    Min 3X/week      PT Plan Current plan remains appropriate    Co-evaluation  AM-PAC PT "6 Clicks" Mobility   Outcome Measure  Help needed turning from your back to your side while in a flat bed without using bedrails?: A Little Help needed moving from lying on your back to sitting on the side of a flat bed without using bedrails?: A Little Help needed moving to and from a bed to a chair (including a wheelchair)?: A Little Help needed standing up from a chair using your arms (e.g., wheelchair or bedside chair)?: A Little Help needed to walk in hospital room?: A Little Help needed climbing 3-5 steps with a railing? : A Little 6 Click Score: 18    End of Session Equipment Utilized During Treatment: Gait belt Activity  Tolerance: Patient limited by fatigue Patient left: in chair;with call bell/phone within reach;with family/visitor present Nurse Communication: Mobility status PT Visit Diagnosis: Other abnormalities of gait and mobility (R26.89);Muscle weakness (generalized) (M62.81);Pain;Difficulty in walking, not elsewhere classified (R26.2)     Time: [id number] PT Time Calculation (min) (ACUTE ONLY): 27 min  Charges:  $Gait Training: 8-22 mins $Therapeutic Exercise: 8-22 mins              Ramond Dial 03/19/2021, 3:56 PM  Mee Hives, PT MS Acute Rehab Dept. Number: [place] [facility] [place]

## 2021-03-19 NOTE — Anesthesia Postprocedure Evaluation (Signed)
Anesthesia Post Note  Patient: Janice Payne  Procedure(s) Performed: Diagnostic Laparoscopy, Exploratory Laparotomy with colon repair (Abdomen)     Patient location during evaluation: PACU Anesthesia Type: General Level of consciousness: patient cooperative and awake Pain management: pain level controlled Vital Signs Assessment: post-procedure vital signs reviewed and stable Respiratory status: spontaneous breathing, nonlabored ventilation, respiratory function stable and patient connected to nasal cannula oxygen Cardiovascular status: blood pressure returned to baseline and stable Postop Assessment: no apparent nausea or vomiting Anesthetic complications: yes   Encounter Notable Events  Notable Event Outcome Phase Comment  Difficult to intubate - expected  Intraprocedure Patient with poor airway anatomy on physical exam. Grade 3-4 view with Prindle 2 and Mac 3 blades. Grade 1 with glidescope. Mask ventillation was not attempted given acute abdomen.    Last Vitals:  Vitals:   03/19/21 0416 03/19/21 0808  BP: 134/77 (!) 158/77  Pulse: 84 93  Resp: 17 18  Temp: 36.7 C 36.7 C  SpO2: 98% 96%    Last Pain:  Vitals:   03/19/21 1113  TempSrc:   PainSc: 2    Pain Goal: Patients Stated Pain Goal: 0 (03/19/21 1113)                 Lashonta Pilling

## 2021-03-20 LAB — BASIC METABOLIC PANEL
Anion gap: 8 (ref 5–15)
BUN: 13 mg/dL (ref 8–23)
CO2: 28 mmol/L (ref 22–32)
Calcium: 9.2 mg/dL (ref 8.9–10.3)
Chloride: 102 mmol/L (ref 98–111)
Creatinine, Ser: 1.31 mg/dL — ABNORMAL HIGH (ref 0.44–1.00)
GFR, Estimated: 42 mL/min — ABNORMAL LOW (ref >60)
Glucose, Bld: 119 mg/dL — ABNORMAL HIGH (ref 70–99)
Potassium: 3.9 mmol/L (ref 3.5–5.1)
Sodium: 138 mmol/L (ref 135–145)

## 2021-03-20 LAB — GLUCOSE, CAPILLARY
Glucose-Capillary: 121 mg/dL — ABNORMAL HIGH (ref 70–99)
Glucose-Capillary: 126 mg/dL — ABNORMAL HIGH (ref 70–99)
Glucose-Capillary: 127 mg/dL — ABNORMAL HIGH (ref 70–99)
Glucose-Capillary: 138 mg/dL — ABNORMAL HIGH (ref 70–99)
Glucose-Capillary: 154 mg/dL — ABNORMAL HIGH (ref 70–99)
Glucose-Capillary: 188 mg/dL — ABNORMAL HIGH (ref 70–99)
Glucose-Capillary: 96 mg/dL (ref 70–99)

## 2021-03-20 NOTE — Progress Notes (Signed)
4 Days Post-Op   Subjective/Chief Complaint:sore  Passing gas  Some soreness    Objective: Vital signs in last 24 hours: Temp:  [97.4 F (36.3 C)-98.3 F (36.8 C)] 97.4 F (36.3 C) (07/29 0851) Pulse Rate:  [78-84] 81 (07/29 0851) Resp:  [16-18] 16 (07/29 0851) BP: (116-142)/(58-66) 142/64 (07/29 0851) SpO2:  [95 %-98 %] 95 % (07/29 0851) Last BM Date: 03/16/21  Intake/Output from previous day: 07/28 0701 - 07/29 0700 In: 540 [P.O.:540] Out: -  Intake/Output this shift: No intake/output data recorded.  General appearance: alert and cooperative Resp: clear to auscultation bilaterally Cardio: regular rate and rhythm, S1, S2 normal, no murmur, click, rub or gallop Incision/Wound: CDI soft NT   Lab Results:  Recent Labs    03/18/21 0030 03/19/21 0045  WBC 12.7* 7.9  HGB 12.4 10.9*  HCT 37.1 32.3*  PLT 137* 156   BMET Recent Labs    03/19/21 0045 03/20/21 0436  NA 130* 138  K 3.5 3.9  CL 96* 102  CO2 26 28  GLUCOSE 157* 119*  BUN 15 13  CREATININE 1.41* 1.31*  CALCIUM 8.4* 9.2   PT/INR No results for input(s): LABPROT, INR in the last 72 hours. ABG No results for input(s): PHART, HCO3 in the last 72 hours.  Invalid input(s): PCO2, PO2  Studies/Results: DG Abd Portable 1V  Result Date: 03/18/2021 CLINICAL DATA:  75 year old female with abdominal distension. EXAM: PORTABLE ABDOMEN - 1 VIEW COMPARISON:  CT abdomen pelvis dated 03/16/2021. FINDINGS: Evaluation is very limited due to body habitus. No definite bowel dilatation. Air is noted throughout the colon. A 15 mm linear radiopaque structure over the right hemipelvis may correspond to the polypectomy clips. The osseous structures are intact. IMPRESSION: No definite bowel dilatation. Electronically Signed   By: Anner Crete M.D.   On: 03/18/2021 19:43    Anti-infectives: Anti-infectives (From admission, onward)    Start     Dose/Rate Route Frequency Ordered Stop   03/16/21 2200  ceFEPIme (MAXIPIME)  2 g in sodium chloride 0.9 % 100 mL IVPB  Status:  Discontinued        2 g 200 mL/hr over 30 Minutes Intravenous Every 12 hours 03/16/21 1800 03/17/21 0014   03/16/21 1815  metroNIDAZOLE (FLAGYL) IVPB 500 mg  Status:  Discontinued        500 mg 100 mL/hr over 60 Minutes Intravenous Every 8 hours 03/16/21 1800 03/17/21 0014       Assessment/Plan: s/p Procedure(s): Diagnostic Laparoscopy, Exploratory Laparotomy with colon repair (N/A)    Advance diet   Ambulate        LOS: 4 days    Turner Daniels MD  03/20/2021

## 2021-03-20 NOTE — Care Management (Signed)
1624 03-20-21 Case Manager received notification that the patient would transition home tomorrow and to arrange home health and DME RW. Case Manager called the patient and she declined home health PT states her son will be at home with her. Patient is agreeable to the durable medical equipment via [employer]. Order sent to [employer]- patient states she may decline due to cost. No further needs from Case Manager at this time.

## 2021-03-20 NOTE — Progress Notes (Addendum)
Patient's has an order for q4hrs CBG monitoring. CBG at 2040 is 138 -given 3units Novolog. Patient also has an order for Novolog mix 30units 70/30 QHS. Recheck bloodsugar at 2221 it was 188. Clarified with A. Ramirez,MD. He stated to skip the Novolog 70/30 30units for now and recheck CBG at MN. insulin not given per MD order.  @ 0002 CBG is 161- 4units Novolog was administered.  Call bell within reach and will continue to close monitor patient.

## 2021-03-20 NOTE — Discharge Instructions (Signed)
[facility]      [facility] [phone]  OPEN ABDOMINAL SURGERY: POST OP INSTRUCTIONS  Always review your discharge instruction sheet given to you by the facility where your surgery was performed.  IF YOU HAVE DISABILITY OR FAMILY LEAVE FORMS, YOU MUST BRING THEM TO THE OFFICE FOR PROCESSING.  PLEASE DO NOT GIVE THEM TO YOUR DOCTOR.  A prescription for pain medication may be given to you upon discharge.  Take your pain medication as prescribed, if needed.  If narcotic pain medicine is not needed, then you may take acetaminophen (Tylenol) or ibuprofen (Advil) as needed. Take your usually prescribed medications unless otherwise directed. If you need a refill on your pain medication, please contact your pharmacy. They will contact our office to request authorization.  Prescriptions will not be filled after 5pm or on week-ends. You should follow a light diet the first few days after arrival home, such as soup and crackers, pudding, etc.unless your doctor has advised otherwise. A high-fiber, low fat diet can be resumed as tolerated.   Be sure to include lots of fluids daily. Most patients will experience some swelling and bruising on the chest and neck area.  Ice packs will help.  Swelling and bruising can take several days to resolve Most patients will experience some swelling and bruising in the area of the incision. Ice pack will help. Swelling and bruising can take several days to resolve..  It is common to experience some constipation if taking pain medication after surgery.  Increasing fluid intake and taking a stool softener will usually help or prevent this problem from occurring.  A mild laxative (Milk of Magnesia or Miralax) should be taken according to package directions if there are no bowel movements after 48 hours.  You may have steri-strips (small skin tapes) in place directly over the incision.  These strips should be left on the skin for 7-10 days.  If your surgeon used skin  glue on the incision, you may shower in 24 hours.  The glue will flake off over the next 2-3 weeks.  Any sutures or staples will be removed at the office during your follow-up visit. You may find that a light gauze bandage over your incision may keep your staples from being rubbed or pulled. You may shower and replace the bandage daily. ACTIVITIES:  You may resume regular (light) daily activities beginning the next day--such as daily self-care, walking, climbing stairs--gradually increasing activities as tolerated.  You may have sexual intercourse when it is comfortable.  Refrain from any heavy lifting or straining until approved by your doctor. You may drive when you no longer are taking prescription pain medication, you can comfortably wear a seatbelt, and you can safely maneuver your car and apply brakes Return to Work: ___________________________________ You should see your doctor in the office for a follow-up appointment approximately two weeks after your surgery.  Make sure that you call for this appointment within a day or two after you arrive home to insure a convenient appointment time. OTHER INSTRUCTIONS:  _____________________________________________________________ _____________________________________________________________  WHEN TO CALL YOUR DOCTOR: Fever over 101.0 Inability to urinate Nausea and/or vomiting Extreme swelling or bruising Continued bleeding from incision. Increased pain, redness, or drainage from the incision. Difficulty swallowing or breathing Muscle cramping or spasms. Numbness or tingling in hands or feet or around lips.  The clinic staff is available to answer your questions during regular business hours.  Please don't hesitate to call and ask to speak to one of   the nurses if you have concerns.  For further questions, please visit [email or web address]  

## 2021-03-20 NOTE — Discharge Summary (Addendum)
Patient ID: Janice Payne [id number] 08-Mar-1946 75 y.o.  Admit date: 03/16/2021 Discharge date: 03/22/2021  Admitting Diagnosis: Colon perforation secondary to polypectomy from colonscopy  Discharge Diagnosis Patient Active Problem List   Diagnosis Date Noted   Colon perforation (HCC) 03/17/2021   S/P exploratory laparotomy 03/16/2021   Severe obesity (BMI 35.0-39.9) with comorbidity (HCC) 03/06/2021   Peripheral autonomic neuropathy due to secondary diabetes (HCC) 12/06/2018   GERD with esophagitis 10/25/2018   Chronic daily headache 01/21/2016   Heart murmur, systolic 04/15/2015   Allergic rhinitis 10/02/2014   Post-operative state 12/07/2013   Papilloma of breast 10/08/2013   CKD (chronic kidney disease) stage 3, GFR 30-59 ml/min (HCC) 05/08/2013   Nephrolithiasis 11/03/2012   Generalized anxiety disorder 01/01/2011   Mixed hyperlipidemia 08/28/2010   Type 2 diabetes mellitus with complication, with long-term current use of insulin (HCC) 07/29/2010   Vitamin D deficiency 06/18/2010   Essential hypertension with goal blood pressure less than 140/90 05/06/2010   Esophageal reflux 03/05/2010  S/p ex lap with cecal repair  Consultants none  Reason for Admission: Janice Payne is a 74 yo female who underwent a screening colonoscopy today after having an episode of acute diverticulitis several months ago. During the procedure she was found to have a polyp in the cecum. A polypectomy was performed and the site was closed with a clip. The patient had abdominal pain after waking up from the procedure, and after she got home had an episode of vomiting. She had a KUB done as an outpatient, which showed free intraperitoneal air. She was sent to the ED for further workup.   She has been afebrile and hemodynamically stable since arrival. She reports that her pain is mostly in the upper abdomen with radiation to the shoulders, and she feels bloated. She has also passed a small amount of  blood per rectum. WBC is 11.8. Creatinine is mildly elevated at 1.5, but unclear what her baseline is.  Procedures Exploratory laparotomy with cecal repair by Dr. Freida Busman 03/17/21  Hospital Course:  The patient was admitted and given a dose of abx pre-operatively.  She underwent the above procedure and was found to have a serosal tear of the cecum that was primarily repaired.  She tolerated the procedure well.  She was started on a CLD postoperatively and her diet was able to be advanced as tolerated as bowel function returned.  She mobilized with PT who recommended HHPT and this was arranged.  She otherwise remained medically stable during her stay with resumption of home medications for her HTN, DM, etc.  She was otherwise stable on 03/22/2021 for DC home.  Day of discharge: Denies complaints aside from some incisional soreness. Tolerating soft diet with good appetite and no nausea. Having lots of flatus and Bms. She is stable for and comfortable with discharge home later this morning  Physical Exam: AF VSS NAD, comfortable up in chair RRR Normal work of breathing Abdomen is soft, minimal appropriate incisional tenderness without drainage or erythema. Nondistended   Allergies as of 03/22/2021       Reactions   Niacin Itching, Rash   Codeine Nausea And Vomiting, Nausea Only   Niaspan [niacin Er] Nausea And Vomiting   Penicillins Rash        Medication List     STOP taking these medications    losartan 50 MG tablet Commonly known as: COZAAR       TAKE these medications    acetaminophen 325 MG tablet  Commonly known as: TYLENOL Take 650 mg by mouth every 6 (six) hours as needed for mild pain, fever or headache.   ALPRAZolam 0.5 MG tablet Commonly known as: XANAX Take 0.5 mg by mouth at bedtime as needed for sleep.   aspirin 81 MG tablet Take 81 mg by mouth daily.   BLOOD GLUCOSE TEST STRIPS Strp Test blood sugars twice daily. Dispense strips for Onetouch Ultra 2    cetirizine 10 MG tablet Commonly known as: ZYRTEC Take 10 mg by mouth at bedtime.   clotrimazole-betamethasone cream Commonly known as: LOTRISONE Apply 1 application topically 2 (two) times daily as needed (rash/irritation).   fenofibrate 160 MG tablet Take 160 mg by mouth daily before breakfast.   gabapentin 300 MG capsule Commonly known as: NEURONTIN Take 300 mg by mouth 2 (two) times daily.   glucagon 1 MG injection Follow package directions for low blood sugar.   hydrochlorothiazide 12.5 MG tablet Commonly known as: HYDRODIURIL Take 12.5 mg by mouth daily.   Insulin Pen Needle 31G X 6 MM Misc See admin instructions.   methocarbamol 500 MG tablet Commonly known as: ROBAXIN Take 1 tablet (500 mg total) by mouth every 8 (eight) hours as needed for muscle spasms.   metoprolol succinate 50 MG 24 hr tablet Commonly known as: TOPROL-XL Take 50 mg by mouth daily before breakfast. Take with or immediately following a meal.   NovoLIN 70/30 (70-30) 100 UNIT/ML injection Generic drug: insulin NPH-regular Human Inject 30-72 Units into the skin 2 (two) times daily with a meal. Inject 72 units in the morning and 30 units at bedtime   omeprazole 40 MG capsule Commonly known as: PRILOSEC Take 40 mg by mouth daily.   ONE TOUCH ULTRA 2 w/Device Kit Use to check blood sugar as directed   oxyCODONE 5 MG immediate release tablet Commonly known as: Oxy IR/ROXICODONE Take 1-2 tablets (5-10 mg total) by mouth every 4 (four) hours as needed.   Plenvu 140 g Solr Generic drug: PEG-KCl-NaCl-NaSulf-Na Asc-C Take 140 g by mouth as directed.   pravastatin 20 MG tablet Commonly known as: PRAVACHOL Take 20 mg by mouth at bedtime.   RELION INSULIN SYRINGE 1ML/31G 31G X 5/16" 1 ML Misc Generic drug: Insulin Syringe-Needle U-100 Inject into the skin 2 (two) times daily.   sertraline 50 MG tablet Commonly known as: ZOLOFT Take 50 mg by mouth daily. What changed: Another medication with  the same name was removed. Continue taking this medication, and follow the directions you see here.   Vitamin D 50 MCG (2000 UT) tablet Take 2,000 Units by mouth daily.               Durable Medical Equipment  (From admission, onward)           Start     Ordered   03/20/21 1214  For home use only DME Walker rolling  Once       Question Answer Comment  Walker: With 5 Inch Wheels   Patient needs a walker to treat with the following condition Status post exploratory laparotomy      03/20/21 1215              Follow-up Information     Dwan Bolt, MD Follow up on 04/08/2021.   Specialty: General Surgery Why: 3:45pm, arrive by 3:15pm for paperwork and check in process Contact information: [address] [place] [address] [phone]         Otis Brace,  MD Follow up.   Specialty: Gastroenterology Contact information: [place] [address] [phone]         Vicenta Aly, [place] Follow up.   Specialty: Nurse Practitioner Why: As needed Contact information: [place] [address] [phone]         [facility] Follow up.   Why: Rolling Walker to be delivered to the room before d/c home. Contact information: [place] [address] [phone]                 Signed: Nadeen Landau, MD [facility] Use [email or web address] to contact on call provider

## 2021-03-21 LAB — GLUCOSE, CAPILLARY
Glucose-Capillary: 113 mg/dL — ABNORMAL HIGH (ref 70–99)
Glucose-Capillary: 116 mg/dL — ABNORMAL HIGH (ref 70–99)
Glucose-Capillary: 161 mg/dL — ABNORMAL HIGH (ref 70–99)
Glucose-Capillary: 166 mg/dL — ABNORMAL HIGH (ref 70–99)
Glucose-Capillary: 168 mg/dL — ABNORMAL HIGH (ref 70–99)
Glucose-Capillary: 189 mg/dL — ABNORMAL HIGH (ref 70–99)
Glucose-Capillary: 195 mg/dL — ABNORMAL HIGH (ref 70–99)

## 2021-03-21 MED ORDER — INSULIN ASPART PROT & ASPART (70-30 MIX) 100 UNIT/ML ~~LOC~~ SUSP: 30.0000 [IU] | Freq: Every day | SUBCUTANEOUS | Status: DC

## 2021-03-21 NOTE — Progress Notes (Signed)
5 Days Post-Op   Subjective/Chief Complaint:sore  Passing gas  Minimal soreness now No BM yet since OR; started on soft yesterday PM   Objective: Vital signs in last 24 hours: Temp:  [97.4 F (36.3 C)-98.2 F (36.8 C)] 97.9 F (36.6 C) (07/30 0748) Pulse Rate:  [69-81] 72 (07/30 0748) Resp:  [16-19] 19 (07/30 0748) BP: (100-142)/(50-66) 102/50 (07/30 0748) SpO2:  [95 %-100 %] 100 % (07/30 0748) Last BM Date: 03/16/21  Intake/Output from previous day: No intake/output data recorded. Intake/Output this shift: No intake/output data recorded.  General appearance: alert and cooperative Resp: normal work of breathing Cardio: rrr Incision/Wound: CDI soft NT, nondistended  Lab Results:  Recent Labs    03/19/21 0045  WBC 7.9  HGB 10.9*  HCT 32.3*  PLT 156   BMET Recent Labs    03/19/21 0045 03/20/21 0436  NA 130* 138  K 3.5 3.9  CL 96* 102  CO2 26 28  GLUCOSE 157* 119*  BUN 15 13  CREATININE 1.41* 1.31*  CALCIUM 8.4* 9.2   PT/INR No results for input(s): LABPROT, INR in the last 72 hours. ABG No results for input(s): PHART, HCO3 in the last 72 hours.  Invalid input(s): PCO2, PO2  Studies/Results: No results found.  Anti-infectives: Anti-infectives (From admission, onward)    Start     Dose/Rate Route Frequency Ordered Stop   03/16/21 2200  ceFEPIme (MAXIPIME) 2 g in sodium chloride 0.9 % 100 mL IVPB  Status:  Discontinued        2 g 200 mL/hr over 30 Minutes Intravenous Every 12 hours 03/16/21 1800 03/17/21 0014   03/16/21 1815  metroNIDAZOLE (FLAGYL) IVPB 500 mg  Status:  Discontinued        500 mg 100 mL/hr over 60 Minutes Intravenous Every 8 hours 03/16/21 1800 03/17/21 0014       Assessment/Plan: s/p Procedure(s): Diagnostic Laparoscopy, Exploratory Laparotomy with colon repair (N/A)  Cont soft diet Ambulate  Pending BM, possible discharge tomorrow if continues to do well   LOS: 5 days    Ileana Roup MD  03/21/2021

## 2021-03-21 NOTE — Progress Notes (Signed)
Physical Therapy Treatment Patient Details Name: Janice Payne MRN: [id number] DOB: [birth year] Today's Date: 03/21/2021    History of Present Illness 75 yo S/p ex lap with primary repair of cecal injury from colonoscopy, Dr. Zenia Resides 7/26    PT Comments    Pt initially declining treatment, stating she has been up walking with family. Pt also requesting RW be returned as she does not wish to pay for it. Advised pt on benefits of using RW, however pt continued to decline. Pt agreeable to trial ambulation in room with PTA to assess safety without RW. Overall gait is slow and guarded, pt will be able to ambulate short distances w/o RW however would benefit from one for longer distances. Pt continues to refuse. Contacted RN and CM to address return of RW. Will continue to follow acutely.    Follow Up Recommendations  Home health PT;Supervision - Intermittent     Equipment Recommendations  Rolling walker with 5" wheels    Recommendations for Other Services       Precautions / Restrictions Precautions Precautions: Fall;Other (comment) Precaution Comments: abd surgery precautions    Mobility  Bed Mobility               General bed mobility comments: in recliner on arrival    Transfers Overall transfer level: Needs assistance Equipment used: Rolling walker (2 wheeled);1 person hand held assist Transfers: Sit to/from Stand Sit to Stand: Min guard         General transfer comment: cues for safety, hand placement  Ambulation/Gait Ambulation/Gait assistance: Min guard Gait Distance (Feet): 40 Feet Assistive device: None Gait Pattern/deviations: Step-through pattern;Decreased stride length;Trunk flexed;Wide base of support Gait velocity: decreased Gait velocity interpretation: <1.31 ft/sec, indicative of household ambulator General Gait Details: Pt ambulated w/o AD as she reports she does not have a RW and will not be taking home the one that was delivered to her room. Pt  with slow guarded gait, flexed trunk due to abdominal pain.   Stairs             Wheelchair Mobility    Modified Rankin (Stroke Patients Only)       Balance Overall balance assessment: Needs assistance Sitting-balance support: Feet supported Sitting balance-Leahy Scale: Good     Standing balance support: Bilateral upper extremity supported;During functional activity Standing balance-Leahy Scale: Fair Standing balance comment: reliant on B UE support and min guard                            Cognition Arousal/Alertness: Awake/alert Behavior During Therapy: WFL for tasks assessed/performed Overall Cognitive Status: Within Functional Limits for tasks assessed                                        Exercises      General Comments        Pertinent Vitals/Pain Pain Assessment: Faces Faces Pain Scale: Hurts a little bit Pain Location: abdomen Pain Descriptors / Indicators: Grimacing;Guarding;Moaning Pain Intervention(s): Monitored during session;Limited activity within patient's tolerance    Home Living                      Prior Function            PT Goals (current goals can now be found in the care plan section) Acute Rehab PT Goals  Patient Stated Goal: to feel better, return home PT Goal Formulation: With patient/family Time For Goal Achievement: 03/31/21 Potential to Achieve Goals: Good Progress towards PT goals: Progressing toward goals    Frequency    Min 3X/week      PT Plan Current plan remains appropriate    Co-evaluation              AM-PAC PT "6 Clicks" Mobility   Outcome Measure  Help needed turning from your back to your side while in a flat bed without using bedrails?: A Little Help needed moving from lying on your back to sitting on the side of a flat bed without using bedrails?: A Little Help needed moving to and from a bed to a chair (including a wheelchair)?: A Little Help needed  standing up from a chair using your arms (e.g., wheelchair or bedside chair)?: A Little Help needed to walk in hospital room?: A Little Help needed climbing 3-5 steps with a railing? : A Little 6 Click Score: 18    End of Session   Activity Tolerance: Patient tolerated treatment well;Other (comment) (disinterested, self limiting) Patient left: in chair;with call bell/phone within reach;with family/visitor present Nurse Communication: Mobility status;Other (comment) (wants RW picked up and returned.) PT Visit Diagnosis: Other abnormalities of gait and mobility (R26.89);Muscle weakness (generalized) (M62.81);Pain;Difficulty in walking, not elsewhere classified (R26.2) Pain - part of body:  (abdomen)     Time: [id number] PT Time Calculation (min) (ACUTE ONLY): 12 min  Charges:  $Gait Training: 8-22 mins                     Benjiman Core, [place] Pager [phone] Acute Rehab  Allena Katz 03/21/2021, 2:00 PM

## 2021-03-21 NOTE — Progress Notes (Signed)
Patient ID: Janice Payne, female   DOB: Feb 16, 1946, 75 y.o.   MRN: [id number]  Feels ok. Denies abdominal pain. Sitting in bedside chair. Took Miralax this morning. No BM yet. Son in room.  Hopefully home tomorrow. Diet per surgery. Appreciate surgery care.

## 2021-03-22 LAB — GLUCOSE, CAPILLARY
Glucose-Capillary: 105 mg/dL — ABNORMAL HIGH (ref 70–99)
Glucose-Capillary: 126 mg/dL — ABNORMAL HIGH (ref 70–99)
Glucose-Capillary: 131 mg/dL — ABNORMAL HIGH (ref 70–99)
Glucose-Capillary: 170 mg/dL — ABNORMAL HIGH (ref 70–99)

## 2021-03-22 MED ORDER — METHOCARBAMOL 500 MG PO TABS: 500.0000 mg | ORAL_TABLET | Freq: Three times a day (TID) | ORAL | 0 refills | Status: AC | PRN

## 2021-03-22 MED ORDER — OXYCODONE HCL 5 MG PO TABS: 5.0000 mg | ORAL_TABLET | ORAL | 0 refills | Status: DC | PRN

## 2021-03-22 NOTE — TOC Progression Note (Signed)
Transition of Care [facility]) - Progression Note    Patient Details  Name: Janice Payne MRN: [id number] Date of Birth: Jul 11, 1946  Transition of Care [facility]) CM/SW Contact  Bartholomew Crews, RN Phone Number: [phone] 03/22/2021, 8:54 AM  Clinical Narrative:     Notified by nursing yesterday afternoon that patient was now declining RW and requesting to have RW picked up by [employer]. Notified Jasmine at [facility] yesterday afternoon - RW to be picked up.     Expected Discharge Plan and Services           Expected Discharge Date: 03/22/21                                     Social Determinants of Health (SDOH) Interventions    Readmission Risk Interventions No flowsheet data found.

## 2021-03-29 ENCOUNTER — Observation Stay (HOSPITAL_COMMUNITY)
Admission: EM | Admit: 2021-03-29 | Discharge: 2021-03-31 | Disposition: A | Discharge status: Home / Self Care | Payer: Medicare Other | Attending: Internal Medicine | Admitting: Internal Medicine

## 2021-03-29 ENCOUNTER — Emergency Department (HOSPITAL_COMMUNITY): Payer: Medicare Other

## 2021-03-29 ENCOUNTER — Other Ambulatory Visit: Payer: Self-pay

## 2021-03-29 ENCOUNTER — Encounter (HOSPITAL_COMMUNITY): Payer: Self-pay | Admitting: Emergency Medicine

## 2021-03-29 DIAGNOSIS — E118 Type 2 diabetes mellitus with unspecified complications: Secondary | ICD-10-CM

## 2021-03-29 DIAGNOSIS — I129 Hypertensive chronic kidney disease with stage 1 through stage 4 chronic kidney disease, or unspecified chronic kidney disease: Secondary | ICD-10-CM | POA: Diagnosis not present

## 2021-03-29 DIAGNOSIS — J45909 Unspecified asthma, uncomplicated: Secondary | ICD-10-CM | POA: Insufficient documentation

## 2021-03-29 DIAGNOSIS — I10 Essential (primary) hypertension: Secondary | ICD-10-CM | POA: Diagnosis present

## 2021-03-29 DIAGNOSIS — E1122 Type 2 diabetes mellitus with diabetic chronic kidney disease: Secondary | ICD-10-CM | POA: Diagnosis not present

## 2021-03-29 DIAGNOSIS — Z794 Long term (current) use of insulin: Secondary | ICD-10-CM | POA: Diagnosis not present

## 2021-03-29 DIAGNOSIS — E1142 Type 2 diabetes mellitus with diabetic polyneuropathy: Secondary | ICD-10-CM

## 2021-03-29 DIAGNOSIS — D649 Anemia, unspecified: Secondary | ICD-10-CM | POA: Insufficient documentation

## 2021-03-29 DIAGNOSIS — K922 Gastrointestinal hemorrhage, unspecified: Secondary | ICD-10-CM | POA: Diagnosis not present

## 2021-03-29 DIAGNOSIS — S301XXA Contusion of abdominal wall, initial encounter: Secondary | ICD-10-CM

## 2021-03-29 DIAGNOSIS — Z79899 Other long term (current) drug therapy: Secondary | ICD-10-CM | POA: Diagnosis not present

## 2021-03-29 DIAGNOSIS — Z20822 Contact with and (suspected) exposure to covid-19: Secondary | ICD-10-CM | POA: Diagnosis not present

## 2021-03-29 DIAGNOSIS — E1129 Type 2 diabetes mellitus with other diabetic kidney complication: Secondary | ICD-10-CM

## 2021-03-29 DIAGNOSIS — N1832 Chronic kidney disease, stage 3b: Secondary | ICD-10-CM | POA: Diagnosis not present

## 2021-03-29 DIAGNOSIS — N183 Chronic kidney disease, stage 3 unspecified: Secondary | ICD-10-CM | POA: Diagnosis not present

## 2021-03-29 DIAGNOSIS — Z9889 Other specified postprocedural states: Secondary | ICD-10-CM

## 2021-03-29 DIAGNOSIS — Z7982 Long term (current) use of aspirin: Secondary | ICD-10-CM | POA: Insufficient documentation

## 2021-03-29 DIAGNOSIS — M7981 Nontraumatic hematoma of soft tissue: Secondary | ICD-10-CM | POA: Diagnosis not present

## 2021-03-29 DIAGNOSIS — K921 Melena: Secondary | ICD-10-CM | POA: Diagnosis present

## 2021-03-29 LAB — COMPREHENSIVE METABOLIC PANEL
ALT: 26 U/L (ref 0–44)
AST: 33 U/L (ref 15–41)
Albumin: 3.7 g/dL (ref 3.5–5.0)
Alkaline Phosphatase: 48 U/L (ref 38–126)
Anion gap: 11 (ref 5–15)
BUN: 22 mg/dL (ref 8–23)
CO2: 23 mmol/L (ref 22–32)
Calcium: 9.4 mg/dL (ref 8.9–10.3)
Chloride: 103 mmol/L (ref 98–111)
Creatinine, Ser: 1.53 mg/dL — ABNORMAL HIGH (ref 0.44–1.00)
GFR, Estimated: 35 mL/min — ABNORMAL LOW (ref >60)
Glucose, Bld: 154 mg/dL — ABNORMAL HIGH (ref 70–99)
Potassium: 3.8 mmol/L (ref 3.5–5.1)
Sodium: 137 mmol/L (ref 135–145)
Total Bilirubin: 0.8 mg/dL (ref 0.3–1.2)
Total Protein: 6.8 g/dL (ref 6.5–8.1)

## 2021-03-29 LAB — CBC
HCT: 35.7 % — ABNORMAL LOW (ref 36.0–46.0)
Hemoglobin: 11.7 g/dL — ABNORMAL LOW (ref 12.0–15.0)
MCH: 29.5 pg (ref 26.0–34.0)
MCHC: 32.8 g/dL (ref 30.0–36.0)
MCV: 90.2 fL (ref 80.0–100.0)
Platelets: 250 10*3/uL (ref 150–400)
RBC: 3.96 MIL/uL (ref 3.87–5.11)
RDW: 13.2 % (ref 11.5–15.5)
WBC: 9.2 10*3/uL (ref 4.0–10.5)
nRBC: 0 % (ref 0.0–0.2)

## 2021-03-29 LAB — CBG MONITORING, ED: Glucose-Capillary: 99 mg/dL (ref 70–99)

## 2021-03-29 LAB — RESP PANEL BY RT-PCR (FLU A&B, COVID) ARPGX2
Influenza A by PCR: NEGATIVE
Influenza B by PCR: NEGATIVE
SARS Coronavirus 2 by RT PCR: NEGATIVE

## 2021-03-29 MED ORDER — METHOCARBAMOL 500 MG PO TABS
500.0000 mg | ORAL_TABLET | Freq: Three times a day (TID) | ORAL | Status: DC | PRN
  Administered 2021-03-30: 500 mg via ORAL
  Filled 2021-03-29: qty 1

## 2021-03-29 MED ORDER — ACETAMINOPHEN 325 MG PO TABS: 650.0000 mg | ORAL_TABLET | Freq: Four times a day (QID) | ORAL | Status: DC | PRN

## 2021-03-29 MED ORDER — CLOTRIMAZOLE-BETAMETHASONE 1-0.05 % EX CREA: 1.0000 "application " | TOPICAL_CREAM | Freq: Two times a day (BID) | CUTANEOUS | Status: DC | PRN

## 2021-03-29 MED ORDER — ONDANSETRON HCL 4 MG PO TABS: 4.0000 mg | ORAL_TABLET | Freq: Four times a day (QID) | ORAL | Status: DC | PRN

## 2021-03-29 MED ORDER — IOHEXOL 300 MG/ML  SOLN
75.0000 mL | Freq: Once | INTRAMUSCULAR | Status: AC | PRN
  Administered 2021-03-29: 75 mL via INTRAVENOUS

## 2021-03-29 MED ORDER — ONDANSETRON HCL 4 MG/2ML IJ SOLN: 4.0000 mg | Freq: Four times a day (QID) | INTRAMUSCULAR | Status: DC | PRN

## 2021-03-29 MED ORDER — GABAPENTIN 300 MG PO CAPS
300.0000 mg | ORAL_CAPSULE | Freq: Two times a day (BID) | ORAL | Status: DC
  Administered 2021-03-29 – 2021-03-31 (×4): 300 mg via ORAL
  Filled 2021-03-29 (×4): qty 1

## 2021-03-29 MED ORDER — METOPROLOL SUCCINATE ER 50 MG PO TB24
50.0000 mg | ORAL_TABLET | Freq: Every day | ORAL | Status: DC
  Administered 2021-03-30 – 2021-03-31 (×2): 50 mg via ORAL
  Filled 2021-03-29: qty 2
  Filled 2021-03-29: qty 1

## 2021-03-29 MED ORDER — ALPRAZOLAM 0.5 MG PO TABS
0.5000 mg | ORAL_TABLET | Freq: Every evening | ORAL | Status: DC | PRN
  Administered 2021-03-30: 0.5 mg via ORAL
  Filled 2021-03-29: qty 1

## 2021-03-29 MED ORDER — INSULIN NPH (HUMAN) (ISOPHANE) 100 UNIT/ML ~~LOC~~ SUSP: 20.0000 [IU] | Freq: Two times a day (BID) | SUBCUTANEOUS | Status: DC

## 2021-03-29 MED ORDER — LORATADINE 10 MG PO TABS
10.0000 mg | ORAL_TABLET | Freq: Every day | ORAL | Status: DC
  Filled 2021-03-29 (×2): qty 1

## 2021-03-29 MED ORDER — SERTRALINE HCL 50 MG PO TABS
50.0000 mg | ORAL_TABLET | Freq: Every day | ORAL | Status: DC
  Administered 2021-03-30 – 2021-03-31 (×2): 50 mg via ORAL
  Filled 2021-03-29 (×2): qty 1

## 2021-03-29 MED ORDER — ACETAMINOPHEN 650 MG RE SUPP: 650.0000 mg | Freq: Four times a day (QID) | RECTAL | Status: DC | PRN

## 2021-03-29 MED ORDER — INSULIN ASPART 100 UNIT/ML IJ SOLN
0.0000 [IU] | INTRAMUSCULAR | Status: DC
Start: 2021-03-29 — End: 2021-03-31
  Administered 2021-03-30: 1 [IU] via SUBCUTANEOUS
  Administered 2021-03-30: 2 [IU] via SUBCUTANEOUS
  Administered 2021-03-30: 1 [IU] via SUBCUTANEOUS
  Administered 2021-03-30 – 2021-03-31 (×3): 2 [IU] via SUBCUTANEOUS
  Administered 2021-03-31: 3 [IU] via SUBCUTANEOUS
  Administered 2021-03-31 (×2): 2 [IU] via SUBCUTANEOUS
  Administered 2021-03-31: 1 [IU] via SUBCUTANEOUS

## 2021-03-29 MED ORDER — OXYCODONE HCL 5 MG PO TABS
5.0000 mg | ORAL_TABLET | ORAL | Status: DC | PRN
  Administered 2021-03-29 – 2021-03-31 (×5): 5 mg via ORAL
  Filled 2021-03-29 (×5): qty 1

## 2021-03-29 MED ORDER — PANTOPRAZOLE SODIUM 40 MG PO TBEC
80.0000 mg | DELAYED_RELEASE_TABLET | Freq: Every day | ORAL | Status: DC
  Administered 2021-03-30 – 2021-03-31 (×2): 80 mg via ORAL
  Filled 2021-03-29 (×2): qty 2

## 2021-03-29 MED ORDER — PRAVASTATIN SODIUM 10 MG PO TABS
20.0000 mg | ORAL_TABLET | Freq: Every day | ORAL | Status: DC
  Administered 2021-03-29 – 2021-03-30 (×2): 20 mg via ORAL
  Filled 2021-03-29 (×3): qty 2

## 2021-03-29 MED ORDER — FENOFIBRATE 160 MG PO TABS
160.0000 mg | ORAL_TABLET | Freq: Every day | ORAL | Status: DC
  Administered 2021-03-30 – 2021-03-31 (×2): 160 mg via ORAL
  Filled 2021-03-29 (×2): qty 1

## 2021-03-29 NOTE — ED Provider Notes (Signed)
[facility] EMERGENCY DEPARTMENT Provider Note   CSN: [id number] Arrival date & time: 03/29/21  1424     History No chief complaint on file.   Janice Payne is a 75 y.o. female.  HPI  75 year old female with a history of colonoscopy and polypectomy status post pneumoperitoneum secondary to bowel perforation and subsequent diagnostic and exploratory laparotomy with colon repair presenting to the emergency department with hematochezia.  The patient states that she has had 1 day of bright red blood per rectum with associated mixed in dark tarry stools.  Concern for possible melena in addition to hematochezia.  The patient endorses mild abdominal discomfort.  Gastroenterology was bedside and communicated with me on patient arrival that they requested further work-up with CT imaging, CBC to evaluate patient's hemoglobin, with an ultimate plan for admission for observation given the patient's recent history.  Past Medical History:  Diagnosis Date   Arthritis    left hip, neck   Dental crown present    Difficult airway    Dry mouth    GERD (gastroesophageal reflux disease)    History of asthma    no current problems or med.   History of kidney stones    Hyperlipidemia    Hypertension    under control with meds.   Non-insulin dependent type 2 diabetes mellitus ([place])    Papilloma of right breast 10/21/2013   Seasonal allergies    Sinus headache     Patient Active Problem List   Diagnosis Date Noted   Hematochezia 03/29/2021   Colon perforation ([place]) 03/17/2021   S/P exploratory laparotomy 03/16/2021   Severe obesity (BMI 35.0-39.9) with comorbidity ([place]) 03/06/2021   Peripheral autonomic neuropathy due to secondary diabetes ([place]) 12/06/2018   GERD with esophagitis 10/25/2018   Chronic daily headache 01/21/2016   Heart murmur, systolic 60/05/9322   Allergic rhinitis 10/02/2014   Post-operative state 12/07/2013   Papilloma of breast 10/08/2013   CKD  (chronic kidney disease) stage 3, GFR 30-59 ml/min ([facility]) 05/08/2013   Nephrolithiasis 11/03/2012   Generalized anxiety disorder 01/01/2011   Mixed hyperlipidemia 08/28/2010   Type 2 diabetes mellitus with complication, with long-term current use of insulin ([place]) 07/29/2010   Vitamin D deficiency 06/18/2010   Essential hypertension with goal blood pressure less than 140/90 05/06/2010   Esophageal reflux 03/05/2010    Past Surgical History:  Procedure Laterality Date   BREAST BIOPSY Right 11/14/2013   Procedure: RIGHT BREAST NEEDLE LOCALIZATION;  Surgeon: Marcello Moores A. Cornett, MD;  Location: [facility];  Service: General;  Laterality: Right;   EXTRACORPOREAL SHOCK WAVE LITHOTRIPSY  11/27/2012   LAPAROTOMY N/A 03/16/2021   Procedure: Diagnostic Laparoscopy, Exploratory Laparotomy with colon repair;  Surgeon: Dwan Bolt, MD;  Location: [facility];  Service: General;  Laterality: N/A;   TUBAL LIGATION       OB History   No obstetric history on file.     Family History  Problem Relation Age of Onset   Heart disease Father     Social History   Tobacco Use   Smoking status: Never   Smokeless tobacco: Never  Substance Use Topics   Alcohol use: No   Drug use: No    Home Medications Prior to Admission medications   Medication Sig Start Date End Date Taking? Authorizing Provider  acetaminophen (TYLENOL) 325 MG tablet Take 650 mg by mouth every 6 (six) hours as needed for mild pain, fever or headache.   Yes [provider]  ALPRAZolam (XANAX) 0.5 MG tablet Take 0.5 mg by mouth at bedtime as needed for sleep.   Yes [provider]  aspirin 81 MG tablet Take 81 mg by mouth daily.   Yes [provider]  Blood Glucose Monitoring Suppl (ONE TOUCH ULTRA 2) w/Device KIT Use to check blood sugar as directed 06/18/20  Yes [provider]  cetirizine (ZYRTEC) 10 MG tablet Take 10 mg by mouth at bedtime.   Yes [provider]   Cholecalciferol (VITAMIN D) 2000 UNITS tablet Take 2,000 Units by mouth daily.   Yes [provider]  clotrimazole-betamethasone (LOTRISONE) cream Apply 1 application topically 2 (two) times daily as needed (rash/irritation).   Yes [provider]  fenofibrate 160 MG tablet Take 160 mg by mouth daily before breakfast.   Yes [provider]  gabapentin (NEURONTIN) 300 MG capsule Take 300 mg by mouth 2 (two) times daily. 11/05/20  Yes [provider]  glucagon 1 MG injection Follow package directions for low blood sugar. 03/15/17  Yes [provider]  Glucose Blood (BLOOD GLUCOSE TEST STRIPS) STRP Test blood sugars twice daily. Dispense strips for Onetouch Ultra 2 06/18/20  Yes [provider]  hydrochlorothiazide (HYDRODIURIL) 12.5 MG tablet Take 12.5 mg by mouth daily. 04/29/17  Yes [provider]  insulin NPH-regular Human (NOVOLIN 70/30) (70-30) 100 UNIT/ML injection Inject 30-72 Units into the skin 2 (two) times daily with a meal. Inject 72 units in the morning and 30 units at bedtime 02/22/17  Yes [provider]  Insulin Pen Needle 31G X 6 MM MISC See admin instructions. 11/03/16  Yes [provider]  methocarbamol (ROBAXIN) 500 MG tablet Take 1 tablet (500 mg total) by mouth every 8 (eight) hours as needed for muscle spasms. 03/22/21  Yes Ileana Roup, MD  metoprolol succinate (TOPROL-XL) 50 MG 24 hr tablet Take 50 mg by mouth daily before breakfast. Take with or immediately following a meal.   Yes [provider]  omeprazole (PRILOSEC) 40 MG capsule Take 40 mg by mouth daily. 01/26/21  Yes [provider]  oxyCODONE (OXY IR/ROXICODONE) 5 MG immediate release tablet Take 1-2 tablets (5-10 mg total) by mouth every 4 (four) hours as needed. Patient taking differently: Take 5-10 mg by mouth every 4 (four) hours as needed for moderate pain. 03/22/21  Yes Ileana Roup, MD  pravastatin (PRAVACHOL)  20 MG tablet Take 20 mg by mouth at bedtime.    Yes [provider]  sertraline (ZOLOFT) 50 MG tablet Take 50 mg by mouth daily. 11/05/20  Yes [provider]    Allergies    Niacin, Codeine, Niaspan [niacin er], and Penicillins  Review of Systems   Review of Systems  Constitutional:  Negative for chills and fever.  HENT:  Negative for ear pain and sore throat.   Eyes:  Negative for pain and visual disturbance.  Respiratory:  Negative for cough and shortness of breath.   Cardiovascular:  Negative for chest pain and palpitations.  Gastrointestinal:  Positive for abdominal pain and blood in stool. Negative for vomiting.  Genitourinary:  Negative for dysuria and hematuria.  Musculoskeletal:  Negative for arthralgias and back pain.  Skin:  Negative for color change and rash.  Neurological:  Negative for seizures and syncope.  All other systems reviewed and are negative.  Physical Exam Updated Vital Signs BP 126/78   Pulse 74   Temp 99.5 F (37.5 C) (Oral)   Resp 19   SpO2  96%   Physical Exam Vitals and nursing note reviewed.  Constitutional:      General: She is not in acute distress.    Appearance: She is well-developed.  HENT:     Head: Normocephalic and atraumatic.  Eyes:     Conjunctiva/sclera: Conjunctivae normal.  Cardiovascular:     Rate and Rhythm: Normal rate and regular rhythm.     Heart sounds: No murmur heard. Pulmonary:     Effort: Pulmonary effort is normal. No respiratory distress.     Breath sounds: Normal breath sounds.  Abdominal:     Palpations: Abdomen is soft.     Tenderness: There is abdominal tenderness.     Comments: Well-healing surgical incision site present along the ventral abdominal wall, mild surrounding tenderness to palpation with no fluctuance or erythema, no drainage.  Hematoma noted to the right flank with some tenderness.  Musculoskeletal:     Cervical back: Neck supple.  Skin:    General: Skin is warm and dry.   Neurological:     General: No focal deficit present.     Mental Status: She is alert. Mental status is at baseline.    ED Results / Procedures / Treatments   Labs (all labs ordered are listed, but only abnormal results are displayed) Labs Reviewed  COMPREHENSIVE METABOLIC PANEL - Abnormal; Notable for the following components:      Result Value   Glucose, Bld 154 (*)    Creatinine, Ser 1.53 (*)    GFR, Estimated 35 (*)    All other components within normal limits  CBC - Abnormal; Notable for the following components:   Hemoglobin 11.7 (*)    HCT 35.7 (*)    All other components within normal limits  RESP PANEL BY RT-PCR (FLU A&B, COVID) ARPGX2  PROTIME-INR  CBC  BASIC METABOLIC PANEL  CBG MONITORING, ED  POC OCCULT BLOOD, ED  TYPE AND SCREEN    EKG None  Radiology CT Abdomen Pelvis W Contrast  Result Date: 03/29/2021 CLINICAL DATA:  Rectal bleeding.  Recent colonoscopy. EXAM: CT ABDOMEN AND PELVIS WITH CONTRAST TECHNIQUE: Multidetector CT imaging of the abdomen and pelvis was performed using the standard protocol following bolus administration of intravenous contrast. CONTRAST:  31m OMNIPAQUE IOHEXOL 300 MG/ML  SOLN COMPARISON:  03/16/2021 FINDINGS: Lower chest: Lung bases are clear. No effusions. Heart is normal size. Hepatobiliary: Diffuse low-density throughout the liver compatible with fatty infiltration. No focal abnormality. Gallbladder unremarkable. Pancreas: No focal abnormality or ductal dilatation. Spleen: No focal abnormality.  Normal size. Adrenals/Urinary Tract: Small right renal cysts are unchanged. No stones or hydronephrosis. Adrenal glands and urinary bladder unremarkable. Stomach/Bowel: Colonic diverticulosis, most pronounced in the sigmoid colon. No active diverticulitis. Stomach and small bowel decompressed. Vascular/Lymphatic: Aortic atherosclerosis. No evidence of aneurysm or adenopathy. Reproductive: Partially calcified fibroid in the uterus centrally. No  adnexal mass. Other: No free fluid or free air. Musculoskeletal: No acute bony abnormality. IMPRESSION: Colonic diverticulosis.  No active diverticulitis. Mild hepatic steatosis. Aortic atherosclerosis. Fibroid uterus. Electronically Signed   By: KRolm BaptiseM.D.   On: 03/29/2021 19:09    Procedures Procedures   Medications Ordered in ED Medications  acetaminophen (TYLENOL) tablet 650 mg (has no administration in time range)    Or  acetaminophen (TYLENOL) suppository 650 mg (has no administration in time range)  ondansetron (ZOFRAN) tablet 4 mg (has no administration in time range)    Or  ondansetron (ZOFRAN) injection 4 mg (has no administration in time range)  insulin NPH Human (NOVOLIN N) injection 20 Units (has no administration in time range)  insulin aspart (novoLOG) injection 0-9 Units (0 Units Subcutaneous Not Given 03/29/21 2042)  oxyCODONE (Oxy IR/ROXICODONE) immediate release tablet 5-10 mg (has no administration in time range)  metoprolol succinate (TOPROL-XL) 24 hr tablet 50 mg (has no administration in time range)  pantoprazole (PROTONIX) EC tablet 80 mg (has no administration in time range)  ALPRAZolam (XANAX) tablet 0.5 mg (has no administration in time range)  fenofibrate tablet 160 mg (has no administration in time range)  clotrimazole-betamethasone (LOTRISONE) cream 1 application (has no administration in time range)  gabapentin (NEURONTIN) capsule 300 mg (has no administration in time range)  pravastatin (PRAVACHOL) tablet 20 mg (has no administration in time range)  sertraline (ZOLOFT) tablet 50 mg (has no administration in time range)  methocarbamol (ROBAXIN) tablet 500 mg (has no administration in time range)  iohexol (OMNIPAQUE) 300 MG/ML solution 75 mL (75 mLs Intravenous Contrast Given 03/29/21 1845)    ED Course  I have reviewed the triage vital signs and the nursing notes.  Pertinent labs & imaging results that were available during my care of the patient were  reviewed by me and considered in my medical decision making (see chart for details).    MDM Rules/Calculators/A&P                            75 year old female with a history of colonoscopy and polypectomy status post pneumoperitoneum secondary to bowel perforation and subsequent diagnostic and exploratory laparotomy with colon repair presenting to the emergency department with hematochezia.  The patient states that she has had 1 day of bright red blood per rectum with associated mixed in dark tarry stools.  Concern for possible melena in addition to hematochezia.  The patient endorses mild abdominal discomfort.  Gastroenterology was bedside and communicated with me on patient arrival that they requested further work-up with CT imaging, CBC to evaluate patient's hemoglobin, with an ultimate plan for admission for observation given the patient's recent history.  On arrival, the patient was stable.  Vitals reviewed as per above and unremarkable.  Physical exam significant for mild abdominal tenderness to palpation.  No gross melena noted in the bed.  CBC was collected and resulted in a stable anemia with a hemoglobin of 11.7, improved from discharge of 10.910 days ago.  CT abdomen pelvis was performed which revealed no acute abnormalities in the abdomen or pelvis.  Per GI request, hospitalist medicine was consulted for admission for observation given the patient's recent colonic perforation after colonoscopy, exploratory laparotomy and primary repair of the large serosal tear.  We will plan for serial monitoring of the patient's hemoglobins overnight.  Final Clinical Impression(s) / ED Diagnoses Final diagnoses:  Hematochezia  Hematoma of right flank, initial encounter  Anemia, unspecified type    Rx / DC Orders ED Discharge Orders     None        Regan Lemming, MD 03/29/21 2051

## 2021-03-29 NOTE — ED Provider Notes (Signed)
Emergency Medicine Provider Triage Evaluation Note  Janice Payne , a 75 y.o. female  was evaluated in triage.  Pt complains of rectal bleeding as well as significant bruising to the right lower quadrant since last night.  Patient is 2 weeks status post exploratory laparotomy for pneumoperitoneum secondary to bowel perforation after colonoscopy and polypectomy.  She is not anticoagulated.  She endorses some right lower abdominal pain but present primarily for significant episodes of bright red rectal bleeding and bruising to the right lower abdomen..  Review of Systems  Positive: Rectal bleeding, abdominal pain, abdominal bruising Negative: Nausea, vomiting, diarrhea, chest pain, shortness of breath  Physical Exam  BP 127/78 (BP Location: Left Arm)   Pulse 93   Temp 99.5 F (37.5 C) (Oral)   Resp 14   SpO2 97%  Gen:   Awake, no distress   Resp:  Normal effort  MSK:   Moves extremities without difficulty  Other:  Significant bruising over the right lower quadrant, small surgical wounds that are well-healing, likely secondary to drain.  Well-healing surgical scar from exploratory laparotomy  Medical Decision Making  Medically screening exam initiated at 3:00 PM.  Appropriate orders placed.  Janice Payne was informed that the remainder of the evaluation will be completed by another provider, this initial triage assessment does not replace that evaluation, and the importance of remaining in the ED until their evaluation is complete.  This chart was dictated using voice recognition software, Dragon. Despite the best efforts of this provider to proofread and correct errors, errors may still occur which can change documentation meaning.    Emeline Darling, PA-C 03/29/21 1502    Regan Lemming, MD 03/29/21 1900

## 2021-03-29 NOTE — H&P (Signed)
History and Physical    Janice Payne MVH:[id number] DOB: 04-22-1946 DOA: 03/29/2021  PCP: Vicenta Aly, FNP  Patient coming from: Home  I have personally briefly reviewed patient's old medical records in [place]  Chief Complaint: Hematochezia  HPI: Janice Payne is a 75 y.o. female with medical history significant of DM2, HTN, CKD 3.  Pt presented to hospital on 7/25 with bowel perforation following colonoscopy with polypectomy.  Pt underwent exlap and primary repair of colon perf.  Pt discharged 7/31.  Pt had been doing well until last night when she had onset of hematochezia: dark blood and maroon colored blood in stool with each BM.  Has bruise and RLQ pain which has been present since colonoscopy but unchanged.  No N/V/D, no CP, no SOB.   ED Course: HGB 11.7 is actually up from 10.9 on 7/28.  Creat 1.5 is about baseline.   Review of Systems: As per HPI, otherwise all review of systems negative.  Past Medical History:  Diagnosis Date   Arthritis    left hip, neck   Dental crown present    Difficult airway    Dry mouth    GERD (gastroesophageal reflux disease)    History of asthma    no current problems or med.   History of kidney stones    Hyperlipidemia    Hypertension    under control with meds.   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 Cornett, MD;  Location: [facility];  Service: General;  Laterality: Right;   EXTRACORPOREAL SHOCK WAVE LITHOTRIPSY  11/27/2012   LAPAROTOMY N/A 03/16/2021   Procedure: Diagnostic Laparoscopy, Exploratory Laparotomy with colon repair;  Surgeon: Dwan Bolt, MD;  Location: [facility];  Service: General;  Laterality: N/A;   TUBAL LIGATION       reports  that she has never smoked. She has never used smokeless tobacco. She reports that she does not drink alcohol and does not use drugs.  Allergies  Allergen Reactions   Niacin Itching and Rash   Codeine Nausea And Vomiting and Nausea Only   Niaspan [Niacin Er] Nausea And Vomiting   Penicillins Rash    Family History  Problem Relation Age of Onset   Heart disease Father      Prior to Admission medications   Medication Sig Start Date End Date Taking? Authorizing Provider  acetaminophen (TYLENOL) 325 MG tablet Take 650 mg by mouth every 6 (six) hours as needed for mild pain, fever or headache.   Yes [provider]  ALPRAZolam Duanne Moron) 0.5 MG tablet Take 0.5 mg by mouth at bedtime as needed for sleep.   Yes [provider]  aspirin 81 MG tablet Take 81 mg by mouth daily.   Yes [provider]  Blood Glucose Monitoring Suppl (ONE TOUCH ULTRA 2) w/Device KIT Use to check blood sugar as directed 06/18/20  Yes [provider]  cetirizine (ZYRTEC) 10 MG tablet Take 10 mg by mouth at bedtime.   Yes [provider]  Cholecalciferol (VITAMIN D) 2000 UNITS tablet Take 2,000 Units by mouth daily.   Yes [provider]  clotrimazole-betamethasone (LOTRISONE) cream Apply 1 application topically 2 (two) times daily as needed (rash/irritation).  Yes [provider]  fenofibrate 160 MG tablet Take 160 mg by mouth daily before breakfast.   Yes [provider]  gabapentin (NEURONTIN) 300 MG capsule Take 300 mg by mouth 2 (two) times daily. 11/05/20  Yes [provider]  glucagon 1 MG injection Follow package directions for low blood sugar. 03/15/17  Yes [provider]  Glucose Blood (BLOOD GLUCOSE TEST STRIPS) STRP Test blood sugars twice daily. Dispense strips for Onetouch Ultra 2 06/18/20  Yes [provider]  hydrochlorothiazide (HYDRODIURIL) 12.5 MG tablet Take 12.5 mg by mouth daily. 04/29/17  Yes [provider]  insulin NPH-regular Human (NOVOLIN 70/30) (70-30) 100 UNIT/ML injection Inject 30-72 Units into the skin 2 (two) times daily with a meal. Inject 72 units in the morning and 30 units at bedtime 02/22/17  Yes [provider]  Insulin Pen Needle 31G X 6 MM MISC See admin instructions. 11/03/16  Yes [provider]  methocarbamol (ROBAXIN) 500 MG tablet Take 1 tablet (500 mg total) by mouth every 8 (eight) hours as needed for muscle spasms. 03/22/21  Yes Ileana Roup, MD  metoprolol succinate (TOPROL-XL) 50 MG 24 hr tablet Take 50 mg by mouth daily before breakfast. Take with or immediately following a meal.   Yes [provider]  omeprazole (PRILOSEC) 40 MG capsule Take 40 mg by mouth daily. 01/26/21  Yes [provider]  oxyCODONE (OXY IR/ROXICODONE) 5 MG immediate release tablet Take 1-2 tablets (5-10 mg total) by mouth every 4 (four) hours as needed. Patient taking differently: Take 5-10 mg by mouth every 4 (four) hours as needed for moderate pain. 03/22/21  Yes Ileana Roup, MD  pravastatin (PRAVACHOL) 20 MG tablet Take 20 mg by mouth at bedtime.    Yes [provider]  sertraline (ZOLOFT) 50 MG tablet Take 50 mg by mouth daily. 11/05/20  Yes [provider]    Physical Exam: Vitals:   03/29/21 1700 03/29/21 1730 03/29/21 1800 03/29/21 1900  BP: 120/80 125/63 121/69 (!) 122/58  Pulse: 80 72 78 74  Resp: _0 Temp:      TempSrc:      SpO2: 98% 100% 98% 98%    Constitutional: NAD, calm, comfortable Eyes: PERRL, lids and conjunctivae normal ENMT: Mucous membranes are moist. Posterior pharynx clear of any exudate or lesions.Normal dentition.  Neck: normal, supple, no masses, no thyromegaly Respiratory: clear to auscultation bilaterally, no wheezing, no crackles. Normal respiratory effort. No accessory muscle use.  Cardiovascular: Regular rate and rhythm, no murmurs / rubs / gallops. No extremity edema. 2+  pedal pulses. No carotid bruits.  Abdomen: ecchymosis over RLQ, no rebound, no guarding Musculoskeletal: no clubbing / cyanosis. No joint deformity upper and lower extremities. Good ROM, no contractures. Normal muscle tone.  Skin: no rashes, lesions, ulcers. No induration Neurologic: CN 2-12 grossly intact. Sensation intact, DTR normal. Strength 5/5 in all 4.  Psychiatric: Normal judgment and insight. Alert and oriented x 3. Somewhat depressed mood.   Labs on Admission: I have personally reviewed following labs and imaging studies  CBC: Recent Labs  Lab 03/29/21 1455  WBC 9.2  HGB 11.7*  HCT 35.7*  MCV 90.2  PLT 867   Basic Metabolic Panel: Recent Labs  Lab 03/29/21 1455  NA 137  K 3.8  CL 103  CO2 23  GLUCOSE 154*  BUN 22  CREATININE 1.53*  CALCIUM 9.4   GFR: CrCl cannot be calculated (Unknown ideal weight.). Liver  Function Tests: Recent Labs  Lab 03/29/21 1455  AST 33  ALT 26  ALKPHOS 48  BILITOT 0.8  PROT 6.8  ALBUMIN 3.7   No results for input(s): LIPASE, AMYLASE in the last 168 hours. No results for input(s): AMMONIA in the last 168 hours. Coagulation Profile: No results for input(s): INR, PROTIME in the last 168 hours. Cardiac Enzymes: No results for input(s): CKTOTAL, CKMB, CKMBINDEX, TROPONINI in the last 168 hours. BNP (last 3 results) No results for input(s): PROBNP in the last 8760 hours. HbA1C: No results for input(s): HGBA1C in the last 72 hours. CBG: No results for input(s): GLUCAP in the last 168 hours. Lipid Profile: No results for input(s): CHOL, HDL, LDLCALC, TRIG, CHOLHDL, LDLDIRECT in the last 72 hours. Thyroid Function Tests: No results for input(s): TSH, T4TOTAL, FREET4, T3FREE, THYROIDAB in the last 72 hours. Anemia Panel: No results for input(s): VITAMINB12, FOLATE, FERRITIN, TIBC, IRON, RETICCTPCT in the last 72 hours. Urine analysis:    Component Value Date/Time   BILIRUBINUR neg 01/30/2014 1312   PROTEINUR 30 01/30/2014  1312   UROBILINOGEN 1.0 01/30/2014 1312   NITRITE neg 01/30/2014 1312   LEUKOCYTESUR Trace 01/30/2014 1312    Radiological Exams on Admission: CT Abdomen Pelvis W Contrast  Result Date: 03/29/2021 CLINICAL DATA:  Rectal bleeding.  Recent colonoscopy. EXAM: CT ABDOMEN AND PELVIS WITH CONTRAST TECHNIQUE: Multidetector CT imaging of the abdomen and pelvis was performed using the standard protocol following bolus administration of intravenous contrast. CONTRAST:  31m OMNIPAQUE IOHEXOL 300 MG/ML  SOLN COMPARISON:  03/16/2021 FINDINGS: Lower chest: Lung bases are clear. No effusions. Heart is normal size. Hepatobiliary: Diffuse low-density throughout the liver compatible with fatty infiltration. No focal abnormality. Gallbladder unremarkable. Pancreas: No focal abnormality or ductal dilatation. Spleen: No focal abnormality.  Normal size. Adrenals/Urinary Tract: Small right renal cysts are unchanged. No stones or hydronephrosis. Adrenal glands and urinary bladder unremarkable. Stomach/Bowel: Colonic diverticulosis, most pronounced in the sigmoid colon. No active diverticulitis. Stomach and small bowel decompressed. Vascular/Lymphatic: Aortic atherosclerosis. No evidence of aneurysm or adenopathy. Reproductive: Partially calcified fibroid in the uterus centrally. No adnexal mass. Other: No free fluid or free air. Musculoskeletal: No acute bony abnormality. IMPRESSION: Colonic diverticulosis.  No active diverticulitis. Mild hepatic steatosis. Aortic atherosclerosis. Fibroid uterus. Electronically Signed   By: KRolm BaptiseM.D.   On: 03/29/2021 19:09    EKG: Independently reviewed.  Assessment/Plan Principal Problem:   Hematochezia Active Problems:   CKD (chronic kidney disease) stage 3, GFR 30-59 ml/min (HCC)   Essential hypertension with goal blood pressure less than 140/90   Type 2 diabetes mellitus with complication, with long-term current use of insulin (HCC)   S/P exploratory laparotomy     Hematochezia - In setting of recent colonoscopy with polypectomy -> colon perf -> exlap and repair Type and screen done Hold ASA Tele monitor Clear liquid diet for now See GI consult note CT scan without acute findings. Repeat CBC in AM DM2 - Hold 70/30 Sensitive SSI Q4H HTN - Hold HCTZ CKD 3 - Chronic, stable, and baseline Repeat BMP in AM  DVT prophylaxis: SCDs Code Status: Full Family Communication: No family in room Disposition Plan: Home after bleeding stopped Consults called: GI  Admission status: Place in o35    Samuell Knoble, JBrookshireHospitalists  How to contact the [facility] HospitalAttending or Consulting provider [place] covering provider during after hours [place] for this patient?  Check the care team in [facility] Centerand  look for a) attending/consulting [facility] provider listed and b) the [facility] team listed Log into [email or web address]  Amion Physician Scheduling and messaging for groups and whole hospitals  On call and physician scheduling software for group practices, residents, hospitalists and other medical providers for call, clinic, rotation and shift schedules. OnCall Enterprise is a hospital-wide system for scheduling doctors and paging doctors on call. EasyPlot is for scientific plotting and data analysis.  [email or web address]  and use [place]'s universal password to access. If you do not have the password, please contact the hospital operator.  Locate the [facility] provider you are looking for under [facility] and page to a number that you can be directly reached. If you still have difficulty reaching the provider, please page the [facility] (Director on Call) for the Hospitalists listed on amion for assistance.  03/29/2021, 7:56 PM

## 2021-03-29 NOTE — ED Notes (Signed)
Patient transported to CT 

## 2021-03-29 NOTE — ED Triage Notes (Signed)
Pt states she had a colonoscopy last week and had to have surgery for a colon tear.  Reports bright red rectal bleeding.  Denies pain. Also reports bruising to RLQ and believes she had a drain at that location.

## 2021-03-29 NOTE — Progress Notes (Signed)
-   Patient presented to the emergency room with rectal bleeding that started yesterday night.  Patient known to [place] from recent admission.  Please see progress note from March 16, 2021.  She was admitted to the hospital on March 16, 2021 with colonic perforation after colonoscopy.  She underwent exploratory laparotomy and primary repair of large serosal tear.  She was discharged on March 22, 2021.  She was doing relatively well until last night when she started having rectal bleeding.  Complaining of dark blood as well as maroon-colored blood in the stool with each bowel movements.  Also noted bruise over right lower quadrant as well as right lower quadrant discomfort which has been persistent since her colonoscopy.  Physical exam, she is resting comfortably.  Not in acute distress.  Abdominal exam revealed minimally distended abdomen with large bruise over the right lower quadrant.  Bowel sounds present.  No peritoneal signs.  Labs reviewed.  Hemoglobin 11.9.  Normal LFTs.  Creatinine stable  Case discussed with ER physician personally  Recommend CT  scan  for further evaluation of rectal bleeding as well as bruise over right lower quadrant.  Repeat labs in the morning  Family members updated over the phone.  GI will follow  Otis Brace MD, [place] 03/29/2021, 5:13 PM  Contact #  [phone]

## 2021-03-29 NOTE — ED Notes (Signed)
Dr.Jared (Admitting) at bedside.

## 2021-03-30 DIAGNOSIS — I10 Essential (primary) hypertension: Secondary | ICD-10-CM

## 2021-03-30 DIAGNOSIS — D649 Anemia, unspecified: Secondary | ICD-10-CM | POA: Diagnosis not present

## 2021-03-30 DIAGNOSIS — K922 Gastrointestinal hemorrhage, unspecified: Secondary | ICD-10-CM

## 2021-03-30 DIAGNOSIS — N1831 Chronic kidney disease, stage 3a: Secondary | ICD-10-CM | POA: Diagnosis not present

## 2021-03-30 DIAGNOSIS — K921 Melena: Secondary | ICD-10-CM

## 2021-03-30 LAB — CBG MONITORING, ED
Glucose-Capillary: 135 mg/dL — ABNORMAL HIGH (ref 70–99)
Glucose-Capillary: 128 mg/dL — ABNORMAL HIGH (ref 70–99)
Glucose-Capillary: 152 mg/dL — ABNORMAL HIGH (ref 70–99)
Glucose-Capillary: 109 mg/dL — ABNORMAL HIGH (ref 70–99)

## 2021-03-30 LAB — TYPE AND SCREEN
ABO/RH(D): A POS
Antibody Screen: NEGATIVE

## 2021-03-30 LAB — GLUCOSE, CAPILLARY
Glucose-Capillary: 149 mg/dL — ABNORMAL HIGH (ref 70–99)
Glucose-Capillary: 166 mg/dL — ABNORMAL HIGH (ref 70–99)
Glucose-Capillary: 194 mg/dL — ABNORMAL HIGH (ref 70–99)

## 2021-03-30 LAB — CBC
HCT: 32 % — ABNORMAL LOW (ref 36.0–46.0)
Hemoglobin: 10.1 g/dL — ABNORMAL LOW (ref 12.0–15.0)
MCH: 28.7 pg (ref 26.0–34.0)
MCHC: 31.6 g/dL (ref 30.0–36.0)
MCV: 90.9 fL (ref 80.0–100.0)
Platelets: 201 10*3/uL (ref 150–400)
RBC: 3.52 MIL/uL — ABNORMAL LOW (ref 3.87–5.11)
RDW: 13.4 % (ref 11.5–15.5)
WBC: 7 10*3/uL (ref 4.0–10.5)
nRBC: 0 % (ref 0.0–0.2)

## 2021-03-30 LAB — BASIC METABOLIC PANEL
Anion gap: 10 (ref 5–15)
BUN: 19 mg/dL (ref 8–23)
CO2: 24 mmol/L (ref 22–32)
Calcium: 9 mg/dL (ref 8.9–10.3)
Chloride: 101 mmol/L (ref 98–111)
Creatinine, Ser: 1.4 mg/dL — ABNORMAL HIGH (ref 0.44–1.00)
GFR, Estimated: 39 mL/min — ABNORMAL LOW (ref >60)
Glucose, Bld: 128 mg/dL — ABNORMAL HIGH (ref 70–99)
Potassium: 3.7 mmol/L (ref 3.5–5.1)
Sodium: 135 mmol/L (ref 135–145)

## 2021-03-30 LAB — HEMOGLOBIN A1C
Hgb A1c MFr Bld: 6.8 % — ABNORMAL HIGH (ref 4.8–5.6)
Mean Plasma Glucose: 148.46 mg/dL

## 2021-03-30 LAB — HEMOGLOBIN AND HEMATOCRIT, BLOOD
HCT: 33.4 % — ABNORMAL LOW (ref 36.0–46.0)
Hemoglobin: 10.7 g/dL — ABNORMAL LOW (ref 12.0–15.0)

## 2021-03-30 NOTE — Progress Notes (Signed)
PROGRESS NOTE    Janice Payne  [id number] DOB: Oct 10, 1945 DOA: 03/29/2021 PCP: Vicenta Aly, FNP   Brief Narrative:  75 y.o. female PMHx DM Type 2 uncontrolled with hyperglycemia, HTN, CKD Stage 3, asthma, Hx nephrolithiasis, HLD,.   Pt presented to hospital on 7/25 with bowel perforation following colonoscopy with polypectomy.  Pt underwent exlap and primary repair of colon perf.  Pt discharged 7/31.   Pt had been doing well until last night when she had onset of hematochezia: dark blood and maroon colored blood in stool with each BM.   Has bruise and RLQ pain which has been present since colonoscopy but unchanged.   No N/V/D, no CP, no SOB.     ED Course: HGB 11.7 is actually up from 10.9 on 7/28.   Subjective: A/O x4, negative CP, negative SOB.   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  Essential HTN - Hold HCTZ  CKD stage III    DVT prophylaxis: SCD Code Status: Full Family Communication:  Status is: Inpatient    Dispo: The patient is from: Home              Anticipated d/c is to: Home              Anticipated d/c date is: 1 day              Patient currently is not medically stable to d/c.      Consultants:  Dr. Otis Brace GI    Procedures/Significant Events:     I have personally reviewed and interpreted all  radiology studies and my findings are as above.  VENTILATOR SETTINGS:    Cultures 8/7 SARS coronavirus negative 8/7 influenza A/B negative  Antimicrobials:    Devices    LINES / TUBES:      Continuous Infusions:   Objective: Vitals:   03/30/21 0137 03/30/21 0203 03/30/21 0515 03/30/21 0715  BP: 124/70  134/66 130/64  Pulse: 80  78 74  Resp: '13  17 13  '$ Temp:      TempSrc:      SpO2: 97% 98% 96% 96%   No intake or output data in the 24 hours ending 03/30/21 0838 There were no vitals filed for this visit.  Examination:  General: A/O x4, No acute respiratory distress Eyes: negative scleral hemorrhage, negative anisocoria, negative icterus ENT: Negative Runny nose, negative gingival bleeding, Neck:  Negative scars, masses, torticollis, lymphadenopathy, JVD Lungs: Clear to auscultation bilaterally without wheezes or crackles Cardiovascular: Regular rate and rhythm without murmur gallop or rub normal S1 and S2 Abdomen: Positive abdominal pain over incision site, nondistended, positive soft, bowel sounds, no rebound, no ascites, no appreciable mass Extremities: No significant cyanosis, clubbing, or edema bilateral lower extremities Skin: Negative rashes, lesions, ulcers Psychiatric:  Negative depression, negative anxiety, negative fatigue, negative mania  Central nervous system:  Cranial nerves II  through XII intact, tongue/uvula midline, all extremities muscle strength 5/5, sensation intact throughout, negative dysarthria, negative expressive aphasia, negative receptive aphasia.  .     Data Reviewed: Care during the described time interval was provided by me .  I have reviewed this patient's available data, including medical history, events of note, physical examination, and all test results as part of my evaluation.   CBC: Recent Labs  Lab 03/29/21 1455 03/30/21 0500  WBC 9.2 7.0  HGB 11.7* 10.1*  HCT 35.7* 32.0*  MCV 90.2 90.9  PLT 250 [id number]   Basic  Metabolic Panel: Recent Labs  Lab 03/29/21 1455 03/30/21 0500  NA 137 135  K 3.8 3.7  CL 103 101  CO2 23 24  GLUCOSE 154* 128*  BUN 22 19  CREATININE 1.53* 1.40*  CALCIUM 9.4 9.0   GFR: CrCl cannot be calculated (Unknown ideal weight.). Liver Function Tests: Recent Labs  Lab 03/29/21 1455  AST 33  ALT 26  ALKPHOS 48  BILITOT 0.8  PROT 6.8  ALBUMIN 3.7   No results for input(s): LIPASE, AMYLASE in the last 168 hours. No results for input(s): AMMONIA in the last 168 hours. Coagulation Profile: No results for input(s): INR, PROTIME in the last 168 hours. Cardiac Enzymes: No results for input(s): CKTOTAL, CKMB, CKMBINDEX, TROPONINI in the last 168 hours. BNP (last 3 results) No results for input(s): PROBNP in the last 8760 hours. HbA1C: No results for input(s): HGBA1C in the last 72 hours. CBG: Recent Labs  Lab 03/29/21 2041 03/30/21 0139 03/30/21 0524 03/30/21 0804  GLUCAP 99 135* 128* 152*   Lipid Profile: No results for input(s): CHOL, HDL, LDLCALC, TRIG, CHOLHDL, LDLDIRECT in the last 72 hours. Thyroid Function Tests: No results for input(s): TSH, T4TOTAL, FREET4, T3FREE, THYROIDAB in the last 72 hours. Anemia Panel: No results for input(s): VITAMINB12, FOLATE, FERRITIN, TIBC, IRON, RETICCTPCT in the last 72 hours. Urine analysis:    Component Value Date/Time   BILIRUBINUR neg 01/30/2014 1312   PROTEINUR 30 01/30/2014 1312   UROBILINOGEN 1.0 01/30/2014 1312   NITRITE neg 01/30/2014 1312   LEUKOCYTESUR Trace 01/30/2014 1312   Sepsis Labs: '@LABRCNTIP'$ (procalcitonin:4,lacticidven:4)  ) Recent Results (from the past 240 hour(s))  Resp Panel by RT-PCR (Flu A&B, Covid) Nasopharyngeal Swab     Status: None   Collection Time: 03/29/21  8:59 PM   Specimen: Nasopharyngeal Swab; Nasopharyngeal(NP) swabs in vial transport medium  Result Value Ref Range Status   SARS Coronavirus 2 by RT PCR NEGATIVE NEGATIVE Final    Comment: (NOTE) SARS-CoV-2 target nucleic  acids are NOT DETECTED.  The SARS-CoV-2 RNA is generally detectable in upper respiratory specimens during the acute phase of infection. The lowest concentration of SARS-CoV-2 viral copies this assay can detect is 138 copies/mL. A negative result does not preclude SARS-Cov-2 infection and should not be used as the sole basis for treatment or other patient management decisions. A negative result may occur with  improper specimen collection/handling, submission of specimen other than nasopharyngeal swab, presence of viral mutation(s) within the areas targeted by this assay, and inadequate number of viral copies(<138 copies/mL). A negative result must be combined with clinical observations, patient history, and epidemiological information. The expected result is Negative.  Fact Sheet for Patients:  EntrepreneurPulse.com.au  Fact Sheet for Healthcare Providers:  IncredibleEmployment.be  This test is no t yet approved or cleared by the [place] FDA and  has been authorized for detection and/or diagnosis of SARS-CoV-2 by FDA under an Emergency Use  Authorization (EUA). This EUA will remain  in effect (meaning this test can be used) for the duration of the COVID-19 declaration under Section 564(b)(1) of the Act, 21 U.S.C.section 360bbb-3(b)(1), unless the authorization is terminated  or revoked sooner.       Influenza A by PCR NEGATIVE NEGATIVE Final   Influenza B by PCR NEGATIVE NEGATIVE Final    Comment: (NOTE) The Xpert Xpress SARS-CoV-2/FLU/RSV plus assay is intended as an aid in the diagnosis of influenza from Nasopharyngeal swab specimens and should not be used as a sole basis for treatment. Nasal washings and aspirates are unacceptable for Xpert Xpress SARS-CoV-2/FLU/RSV testing.  Fact Sheet for Patients: EntrepreneurPulse.com.au  Fact Sheet for Healthcare Providers: IncredibleEmployment.be  This  test is not yet approved or cleared by the [place] FDA and has been authorized for detection and/or diagnosis of SARS-CoV-2 by FDA under an Emergency Use Authorization (EUA). This EUA will remain in effect (meaning this test can be used) for the duration of the COVID-19 declaration under Section 564(b)(1) of the Act, 21 U.S.C. section 360bbb-3(b)(1), unless the authorization is terminated or revoked.  Performed at [facility], [place] [address], [place], [place] [address]          Radiology Studies: CT Abdomen Pelvis W Contrast  Result Date: 03/29/2021 CLINICAL DATA:  Rectal bleeding.  Recent colonoscopy. EXAM: CT ABDOMEN AND PELVIS WITH CONTRAST TECHNIQUE: Multidetector CT imaging of the abdomen and pelvis was performed using the standard protocol following bolus administration of intravenous contrast. CONTRAST:  49m OMNIPAQUE IOHEXOL 300 MG/ML  SOLN COMPARISON:  03/16/2021 FINDINGS: Lower chest: Lung bases are clear. No effusions. Heart is normal size. Hepatobiliary: Diffuse low-density throughout the liver compatible with fatty infiltration. No focal abnormality. Gallbladder unremarkable. Pancreas: No focal abnormality or ductal dilatation. Spleen: No focal abnormality.  Normal size. Adrenals/Urinary Tract: Small right renal cysts are unchanged. No stones or hydronephrosis. Adrenal glands and urinary bladder unremarkable. Stomach/Bowel: Colonic diverticulosis, most pronounced in the sigmoid colon. No active diverticulitis. Stomach and small bowel decompressed. Vascular/Lymphatic: Aortic atherosclerosis. No evidence of aneurysm or adenopathy. Reproductive: Partially calcified fibroid in the uterus centrally. No adnexal mass. Other: No free fluid or free air. Musculoskeletal: No acute bony abnormality. IMPRESSION: Colonic diverticulosis.  No active diverticulitis. Mild hepatic steatosis. Aortic atherosclerosis. Fibroid uterus. Electronically Signed   By: KRolm BaptiseM.D.   On:  03/29/2021 19:09        Scheduled Meds:  fenofibrate  160 mg Oral QAC breakfast   gabapentin  300 mg Oral BID   insulin aspart  0-9 Units Subcutaneous Q4H   loratadine  10 mg Oral QHS   metoprolol succinate  50 mg Oral QAC breakfast   pantoprazole  80 mg Oral Daily   pravastatin  20 mg Oral QHS   sertraline  50 mg Oral Daily   Continuous Infusions:   LOS: 0 days   The patient is critically ill with multiple organ systems failure and requires high complexity decision making for assessment and support, frequent evaluation and titration of therapies, application of advanced monitoring technologies and extensive interpretation of multiple databases. Critical Care Time devoted to patient care services described in this note  Time spent: 40 minutes     Aranda Bihm, CGeraldo Docker MD [facility]   If 7PM-7AM, please contact night-coverage 03/30/2021, 8:38 AM

## 2021-03-30 NOTE — ED Notes (Signed)
Patient able to walk to restroom with steady gait. Denies dizziness or SOB.

## 2021-03-30 NOTE — ED Notes (Signed)
Placed Breakfast Order 

## 2021-03-30 NOTE — Progress Notes (Signed)
-   Patient seen and examined at bedside in the emergency room.  -Rectal bleeding has improved.  Now only seeing blood on the tissue paper.  Denies abdominal pain, nausea and vomiting.  -On exam, resting comfortably.  Not in acute distress.  Unchanged bruise on the right lower quadrant, otherwise abdominal exam benign.  Labs reviewed.  Drop in hemoglobin noted.  Recommendations ---------------------- -Continue supportive care -Advance diet to soft -Repeat H&H this afternoon. -GI will follow  Otis Brace MD, FACP 03/30/2021, 11:08 AM  Contact #  [phone]

## 2021-03-31 ENCOUNTER — Other Ambulatory Visit (HOSPITAL_COMMUNITY): Payer: Self-pay

## 2021-03-31 DIAGNOSIS — K922 Gastrointestinal hemorrhage, unspecified: Secondary | ICD-10-CM | POA: Diagnosis not present

## 2021-03-31 DIAGNOSIS — N1832 Chronic kidney disease, stage 3b: Secondary | ICD-10-CM | POA: Diagnosis not present

## 2021-03-31 DIAGNOSIS — K921 Melena: Secondary | ICD-10-CM | POA: Diagnosis not present

## 2021-03-31 DIAGNOSIS — E118 Type 2 diabetes mellitus with unspecified complications: Secondary | ICD-10-CM

## 2021-03-31 DIAGNOSIS — Z794 Long term (current) use of insulin: Secondary | ICD-10-CM

## 2021-03-31 DIAGNOSIS — D649 Anemia, unspecified: Secondary | ICD-10-CM | POA: Diagnosis not present

## 2021-03-31 LAB — CBC WITH DIFFERENTIAL/PLATELET
Abs Immature Granulocytes: 0.1 10*3/uL — ABNORMAL HIGH (ref 0.00–0.07)
Basophils Absolute: 0.1 10*3/uL (ref 0.0–0.1)
Basophils Relative: 1 %
Eosinophils Absolute: 0.1 10*3/uL (ref 0.0–0.5)
Eosinophils Relative: 2 %
HCT: 33 % — ABNORMAL LOW (ref 36.0–46.0)
Hemoglobin: 10.9 g/dL — ABNORMAL LOW (ref 12.0–15.0)
Immature Granulocytes: 1 %
Lymphocytes Relative: 24 %
Lymphs Abs: 2 10*3/uL (ref 0.7–4.0)
MCH: 29.4 pg (ref 26.0–34.0)
MCHC: 33 g/dL (ref 30.0–36.0)
MCV: 88.9 fL (ref 80.0–100.0)
Monocytes Absolute: 0.7 10*3/uL (ref 0.1–1.0)
Monocytes Relative: 8 %
Neutro Abs: 5.2 10*3/uL (ref 1.7–7.7)
Neutrophils Relative %: 64 %
Platelets: 242 10*3/uL (ref 150–400)
RBC: 3.71 MIL/uL — ABNORMAL LOW (ref 3.87–5.11)
RDW: 13.2 % (ref 11.5–15.5)
WBC: 8.1 10*3/uL (ref 4.0–10.5)
nRBC: 0 % (ref 0.0–0.2)

## 2021-03-31 LAB — COMPREHENSIVE METABOLIC PANEL
ALT: 27 U/L (ref 0–44)
AST: 29 U/L (ref 15–41)
Albumin: 3.5 g/dL (ref 3.5–5.0)
Alkaline Phosphatase: 44 U/L (ref 38–126)
Anion gap: 6 (ref 5–15)
BUN: 18 mg/dL (ref 8–23)
CO2: 28 mmol/L (ref 22–32)
Calcium: 9.6 mg/dL (ref 8.9–10.3)
Chloride: 101 mmol/L (ref 98–111)
Creatinine, Ser: 1.65 mg/dL — ABNORMAL HIGH (ref 0.44–1.00)
GFR, Estimated: 32 mL/min — ABNORMAL LOW (ref >60)
Glucose, Bld: 158 mg/dL — ABNORMAL HIGH (ref 70–99)
Potassium: 4.4 mmol/L (ref 3.5–5.1)
Sodium: 135 mmol/L (ref 135–145)
Total Bilirubin: 1 mg/dL (ref 0.3–1.2)
Total Protein: 6.6 g/dL (ref 6.5–8.1)

## 2021-03-31 LAB — PHOSPHORUS: Phosphorus: 3.8 mg/dL (ref 2.5–4.6)

## 2021-03-31 LAB — GLUCOSE, CAPILLARY
Glucose-Capillary: 163 mg/dL — ABNORMAL HIGH (ref 70–99)
Glucose-Capillary: 180 mg/dL — ABNORMAL HIGH (ref 70–99)
Glucose-Capillary: 161 mg/dL — ABNORMAL HIGH (ref 70–99)
Glucose-Capillary: 209 mg/dL — ABNORMAL HIGH (ref 70–99)

## 2021-03-31 LAB — MAGNESIUM: Magnesium: 2.1 mg/dL (ref 1.7–2.4)

## 2021-03-31 MED ORDER — HYDROCORTISONE ACETATE 25 MG RE SUPP: 25.0000 mg | Freq: Two times a day (BID) | RECTAL | Status: DC

## 2021-03-31 MED ORDER — PANTOPRAZOLE SODIUM 40 MG PO TBEC
80.0000 mg | DELAYED_RELEASE_TABLET | Freq: Every day | ORAL | 0 refills | Status: DC
  Filled 2021-03-31: qty 30, 15d supply, fill #0

## 2021-03-31 MED ORDER — HYDROCORTISONE ACETATE 25 MG RE SUPP
25.0000 mg | Freq: Two times a day (BID) | RECTAL | 0 refills | Status: DC
Start: 2021-03-31 — End: 2023-03-03
  Filled 2021-03-31: qty 12, 6d supply, fill #0

## 2021-03-31 NOTE — Plan of Care (Signed)
  Problem: Education: Goal: Knowledge of General Education information will improve Description Including pain rating scale, medication(s)/side effects and non-pharmacologic comfort measures Outcome: Progressing   

## 2021-03-31 NOTE — Progress Notes (Signed)
Patient w/ d/c order, pt shared that she is unable to leave until 1730 when her son gets off work.

## 2021-03-31 NOTE — Discharge Summary (Signed)
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 -Per GI Anusol suppository for total 14 days -Follow-up with GI as needed Lab Results  Component Value Date   HGB 10.9 (L) 03/31/2021   HGB 10.7 (L) 03/30/2021   HGB 10.1 (L) 03/30/2021   HGB 11.7 (L) 03/29/2021   HGB 10.9 (L) 03/19/2021  -Stable   Essential HTN - Restart home medication  CKD stage III b (baseline 1.4-1.6) Lab Results  Component Value Date   CREATININE 1.65 (H) 03/31/2021   CREATININE 1.40 (H) 03/30/2021   CREATININE 1.53 (H) 03/29/2021   CREATININE 1.31 (H) 03/20/2021   CREATININE 1.41 (H) 03/19/2021     Discharge Diagnoses:  Principal Problem:   Hematochezia Active Problems:   CKD (chronic kidney disease) stage 3, GFR 30-59 ml/min (HCC)   Essential hypertension with goal blood pressure less than 140/90   Type 2 diabetes mellitus with complication, with long-term current use of insulin (HCC)   S/P exploratory laparotomy   Acute GI bleeding   Discharge Condition: Stable  Diet recommendation: Carb modified  Filed Weights   03/30/21 1203  Weight: 107.5 kg    History of present illness:  75 y.o. female PMHx DM Type 2 uncontrolled with hyperglycemia, HTN, CKD Stage 3, asthma, Hx nephrolithiasis, HLD,.   Pt presented to hospital on 7/25 with bowel perforation following colonoscopy with polypectomy.  Pt underwent exlap and primary repair of colon perf.   Pt discharged 7/31.   Pt had been doing well until last night when she had onset of hematochezia: dark blood and maroon colored blood in stool with each BM.   Has bruise and RLQ pain which has been present since colonoscopy but unchanged.   No N/V/D, no CP, no SOB.     ED Course: HGB 11.7 is actually up from 10.9 on 7/28.  Hospital Course:  See above    Consultations: Dr. Alessandra Bevels [facility]  Cultures  8/7 SARS coronavirus negative 8/7 influenza A/B negative     Discharge Exam: Vitals:   03/30/21 2005 03/30/21 2341 03/31/21 0400 03/31/21 1012  BP: 122/60 (!) 129/57 (!) 143/77 (!) 155/70  Pulse: 87 85 82 75  Resp: $Remo'17 15 19 18  'dqlFI$ Temp: 99.1 F (37.3 C) 98.4 F (36.9 C) 98.3 F (36.8 C) 98.1 F (36.7 C)  TempSrc: Oral Oral Oral Oral  SpO2: 97% 97% 98% 99%  Weight:      Height:        General: A/O x4, No acute respiratory distress Eyes: negative scleral hemorrhage, negative anisocoria, negative icterus ENT: Negative Runny nose, negative gingival bleeding, Neck:  Negative scars, masses, torticollis, lymphadenopathy, JVD Lungs: Clear to auscultation bilaterally without wheezes or crackles Cardiovascular: Regular rate and rhythm without murmur gallop or rub normal S1 and S2  Discharge Instructions   Allergies as of 03/31/2021       Reactions   Niacin Itching, Rash   Codeine Nausea And Vomiting, Nausea Only   Niaspan [niacin Er] Nausea And Vomiting  Penicillins Rash        Medication List     STOP taking these medications    aspirin 81 MG tablet   omeprazole 40 MG capsule Commonly known as: PRILOSEC Replaced by: pantoprazole 40 MG tablet       TAKE these medications    acetaminophen 325 MG tablet Commonly known as: TYLENOL Take 650 mg by mouth every 6 (six) hours as needed for mild pain, fever or headache.   ALPRAZolam 0.5 MG tablet Commonly known as: XANAX Take 0.5 mg by mouth at bedtime as needed for sleep.   BLOOD GLUCOSE TEST STRIPS  Strp Test blood sugars twice daily. Dispense strips for Onetouch Ultra 2   cetirizine 10 MG tablet Commonly known as: ZYRTEC Take 10 mg by mouth at bedtime.   clotrimazole-betamethasone cream Commonly known as: LOTRISONE Apply 1 application topically 2 (two) times daily as needed (rash/irritation).   fenofibrate 160 MG tablet Take 160 mg by mouth daily before breakfast.   gabapentin 300 MG capsule Commonly known as: NEURONTIN Take 300 mg by mouth 2 (two) times daily.   glucagon 1 MG injection Follow package directions for low blood sugar.   hydrochlorothiazide 12.5 MG tablet Commonly known as: HYDRODIURIL Take 12.5 mg by mouth daily.   hydrocortisone 25 MG suppository Commonly known as: ANUSOL-HC Place 1 suppository (25 mg total) rectally 2 (two) times daily.   Insulin Pen Needle 31G X 6 MM Misc See admin instructions.   methocarbamol 500 MG tablet Commonly known as: ROBAXIN Take 1 tablet (500 mg total) by mouth every 8 (eight) hours as needed for muscle spasms.   metoprolol succinate 50 MG 24 hr tablet Commonly known as: TOPROL-XL Take 50 mg by mouth daily before breakfast. Take with or immediately following a meal.   NovoLIN 70/30 (70-30) 100 UNIT/ML injection Generic drug: insulin NPH-regular Human Inject 30-72 Units into the skin 2 (two) times daily with a meal. Inject 72 units in the morning and 30 units at bedtime   ONE TOUCH ULTRA 2 w/Device Kit Use to check blood sugar as directed   oxyCODONE 5 MG immediate release tablet Commonly known as: Oxy IR/ROXICODONE Take 1-2 tablets (5-10 mg total) by mouth every 4 (four) hours as needed. What changed: reasons to take this   pantoprazole 40 MG tablet Commonly known as: PROTONIX Take 2 tablets (80 mg total) by mouth daily. Start taking on: April 01, 2021 Replaces: omeprazole 40 MG capsule   pravastatin 20 MG tablet Commonly known as: PRAVACHOL Take 20 mg by mouth at bedtime.   sertraline 50 MG  tablet Commonly known as: ZOLOFT Take 50 mg by mouth daily.   Vitamin D 50 MCG (2000 UT) tablet Take 2,000 Units by mouth daily.       Allergies  Allergen Reactions   Niacin Itching and Rash   Codeine Nausea And Vomiting and Nausea Only   Niaspan [Niacin Er] Nausea And Vomiting   Penicillins Rash      The results of significant diagnostics from this hospitalization (including imaging, microbiology, ancillary and laboratory) are listed below for reference.    Significant Diagnostic Studies: CT ABDOMEN PELVIS WO CONTRAST  Result Date: 03/16/2021 CLINICAL DATA:  Intraperitoneal free air none on radiograph underwent screening colonoscopy after an acute episode of diverticulitis several months prior. Fall ectomy performed. EXAM: CT ABDOMEN AND PELVIS WITHOUT CONTRAST TECHNIQUE: Multidetector CT imaging of the abdomen and pelvis was performed following the standard protocol without IV contrast. COMPARISON:  CT 01/30/2014 FINDINGS:  Lower chest: Atelectatic changes otherwise clear lung bases. Normal heart size. No pericardial effusion. Coronary calcifications are noted as well as additional calcifications on the aortic leaflets. Hepatobiliary: No worrisome focal liver lesions. Smooth liver surface contour. Normal hepatic attenuation. Normal gallbladder and biliary tree. Pancreas: No pancreatic ductal dilatation or surrounding inflammatory changes. Spleen: Normal in size. No concerning splenic lesions. Adrenals/Urinary Tract: Normal adrenals. Mild bilateral renal atrophy. Two small 11 mm hypoattenuating foci in the lower pole right kidney, favor small cysts. Additional probable parapelvic cyst on the left, too small to fully characterize. No concerning visible or contour deforming renal lesion. Mild symmetric bilateral perinephric stranding, a nonspecific finding which may correlate with advanced age or decreased renal function. No obstructive urolithiasis or hydronephrosis. Stomach/Bowel: Distal  esophagus and stomach are unremarkable. Air and fluid-filled duodenal diverticula seen extending posteriorly from the second and third portions of the duodenum measuring up to 3.5 and 2.5 cm respectively. No associated inflammation. Duodenum is otherwise unremarkable. No small bowel thickening or dilatation. Normal appendix seen in the right lower quadrant extending retrocecal E from the cecal tip. Few foci of adjacent gas albeit without spillage of fluid, or adjacent inflammation to suggest perforation at this site. Surgical clip noted at the tip of the cecum (3/66) correlate with procedure report for the presumed site of of polypectomy performed today. While there is gas around much of the cecum, no spillage of contrast or focal inflammation is evident to suggest perforation at this level either. Colon is diffusely fluid-filled with dilute enteric contrast media. No discernible sites of contrast spillage, colonic inflammation nor inflammation on the additional numerous, predominantly distal colonic diverticula. Vascular/Lymphatic: Atherosclerotic calcifications within the abdominal aorta and branch vessels. No aneurysm or ectasia. No enlarged abdominopelvic lymph nodes. Reproductive: Anteverted, fibroid uterus with multiple partially calcified uterine fibroids. No concerning adnexal mass or lesion. Other: Large volume of pneumoperitoneum. No free fluid or spillage of administered enteric contrast media. No organized collection or abscess. Musculoskeletal: No acute osseous abnormality or suspicious osseous lesion. Multilevel degenerative changes are present in the imaged portions of the spine. IMPRESSION: Metallic clip noted at the cecum, likely to correspond to site of patient's same day polypectomy. While a small micro perforation could exist in this vicinity, no discrete CT abnormality to is seen in this vicinity or elsewhere to provide a distinct identifiable source of the patient's large volume of  pneumoperitoneum. No other worrisome features to suggest spillage of bowel succus or a frank peritonitis. Recommend correlation with clinical exam findings and patient clinical status. Diverticulosis without evidence acute diverticulitis. Fibroid uterus. Aortic Atherosclerosis (ICD10-I70.0). Electronically Signed   By: Lovena Le M.D.   On: 03/16/2021 21:15   CT Abdomen Pelvis W Contrast  Result Date: 03/29/2021 CLINICAL DATA:  Rectal bleeding.  Recent colonoscopy. EXAM: CT ABDOMEN AND PELVIS WITH CONTRAST TECHNIQUE: Multidetector CT imaging of the abdomen and pelvis was performed using the standard protocol following bolus administration of intravenous contrast. CONTRAST:  18mL OMNIPAQUE IOHEXOL 300 MG/ML  SOLN COMPARISON:  03/16/2021 FINDINGS: Lower chest: Lung bases are clear. No effusions. Heart is normal size. Hepatobiliary: Diffuse low-density throughout the liver compatible with fatty infiltration. No focal abnormality. Gallbladder unremarkable. Pancreas: No focal abnormality or ductal dilatation. Spleen: No focal abnormality.  Normal size. Adrenals/Urinary Tract: Small right renal cysts are unchanged. No stones or hydronephrosis. Adrenal glands and urinary bladder unremarkable. Stomach/Bowel: Colonic diverticulosis, most pronounced in the sigmoid colon. No active diverticulitis. Stomach and small bowel decompressed. Vascular/Lymphatic: Aortic atherosclerosis. No  evidence of aneurysm or adenopathy. Reproductive: Partially calcified fibroid in the uterus centrally. No adnexal mass. Other: No free fluid or free air. Musculoskeletal: No acute bony abnormality. IMPRESSION: Colonic diverticulosis.  No active diverticulitis. Mild hepatic steatosis. Aortic atherosclerosis. Fibroid uterus. Electronically Signed   By: Rolm Baptise M.D.   On: 03/29/2021 19:09   DG Abd 2 Views  Result Date: 03/16/2021 CLINICAL DATA:  Abdominal pain.  Status post colonoscopy today. EXAM: ABDOMEN - 2 VIEW COMPARISON:  None.  FINDINGS: No evidence of dilated bowel loops, however a moderate to large amount of free intraperitoneal air is seen, consistent with bowel perforation. IMPRESSION: Moderate to large amount of free intraperitoneal air, consistent with bowel perforation. No evidence of bowel obstruction. These results were called by telephone at the time of interpretation on 03/16/2021 at 4:26 pm to provider Dr. Gilmore Laroche nurse Judeen Hammans , who verbally acknowledged these results. Electronically Signed   By: Marlaine Hind M.D.   On: 03/16/2021 16:28   DG Abd Portable 1V  Result Date: 03/18/2021 CLINICAL DATA:  75 year old female with abdominal distension. EXAM: PORTABLE ABDOMEN - 1 VIEW COMPARISON:  CT abdomen pelvis dated 03/16/2021. FINDINGS: Evaluation is very limited due to body habitus. No definite bowel dilatation. Air is noted throughout the colon. A 15 mm linear radiopaque structure over the right hemipelvis may correspond to the polypectomy clips. The osseous structures are intact. IMPRESSION: No definite bowel dilatation. Electronically Signed   By: Anner Crete M.D.   On: 03/18/2021 19:43   DG Foot Complete Left  Result Date: 03/06/2021 Please see detailed radiograph report in office note.  DG Foot Complete Right  Result Date: 03/06/2021 Please see detailed radiograph report in office note.   Microbiology: Recent Results (from the past 240 hour(s))  Resp Panel by RT-PCR (Flu A&B, Covid) Nasopharyngeal Swab     Status: None   Collection Time: 03/29/21  8:59 PM   Specimen: Nasopharyngeal Swab; Nasopharyngeal(NP) swabs in vial transport medium  Result Value Ref Range Status   SARS Coronavirus 2 by RT PCR NEGATIVE NEGATIVE Final    Comment: (NOTE) SARS-CoV-2 target nucleic acids are NOT DETECTED.  The SARS-CoV-2 RNA is generally detectable in upper respiratory specimens during the acute phase of infection. The lowest concentration of SARS-CoV-2 viral copies this assay can detect is 138  copies/mL. A negative result does not preclude SARS-Cov-2 infection and should not be used as the sole basis for treatment or other patient management decisions. A negative result may occur with  improper specimen collection/handling, submission of specimen other than nasopharyngeal swab, presence of viral mutation(s) within the areas targeted by this assay, and inadequate number of viral copies(<138 copies/mL). A negative result must be combined with clinical observations, patient history, and epidemiological information. The expected result is Negative.  Fact Sheet for Patients:  EntrepreneurPulse.com.au  Fact Sheet for Healthcare Providers:  IncredibleEmployment.be  This test is no t yet approved or cleared by the [place] FDA and  has been authorized for detection and/or diagnosis of SARS-CoV-2 by FDA under an Emergency Use Authorization (EUA). This EUA will remain  in effect (meaning this test can be used) for the duration of the COVID-19 declaration under Section 564(b)(1) of the Act, 21 U.S.C.section 360bbb-3(b)(1), unless the authorization is terminated  or revoked sooner.       Influenza A by PCR NEGATIVE NEGATIVE Final   Influenza B by PCR NEGATIVE NEGATIVE Final    Comment: (NOTE) The Xpert Xpress SARS-CoV-2/FLU/RSV plus assay is intended  as an aid in the diagnosis of influenza from Nasopharyngeal swab specimens and should not be used as a sole basis for treatment. Nasal washings and aspirates are unacceptable for Xpert Xpress SARS-CoV-2/FLU/RSV testing.  Fact Sheet for Patients: EntrepreneurPulse.com.au  Fact Sheet for Healthcare Providers: IncredibleEmployment.be  This test is not yet approved or cleared by the [place] FDA and has been authorized for detection and/or diagnosis of SARS-CoV-2 by FDA under an Emergency Use Authorization (EUA). This EUA will remain in effect (meaning  this test can be used) for the duration of the COVID-19 declaration under Section 564(b)(1) of the Act, 21 U.S.C. section 360bbb-3(b)(1), unless the authorization is terminated or revoked.  Performed at [facility], [place] [address] [place], [place] [address]      Labs: Basic Metabolic Panel: Recent Labs  Lab 03/29/21 1455 03/30/21 0500 03/31/21 0203  NA 137 135 135  K 3.8 3.7 4.4  CL 103 101 101  CO2 $Re'23 24 28  'lDT$ GLUCOSE 154* 128* 158*  BUN $Re'22 19 18  'Wih$ CREATININE 1.53* 1.40* 1.65*  CALCIUM 9.4 9.0 9.6  MG  --   --  2.1  PHOS  --   --  3.8   Liver Function Tests: Recent Labs  Lab 03/29/21 1455 03/31/21 0203  AST 33 29  ALT 26 27  ALKPHOS 48 44  BILITOT 0.8 1.0  PROT 6.8 6.6  ALBUMIN 3.7 3.5   No results for input(s): LIPASE, AMYLASE in the last 168 hours. No results for input(s): AMMONIA in the last 168 hours. CBC: Recent Labs  Lab 03/29/21 1455 03/30/21 0500 03/30/21 1522 03/31/21 0203  WBC 9.2 7.0  --  8.1  NEUTROABS  --   --   --  5.2  HGB 11.7* 10.1* 10.7* 10.9*  HCT 35.7* 32.0* 33.4* 33.0*  MCV 90.2 90.9  --  88.9  PLT 250 201  --  242   Cardiac Enzymes: No results for input(s): CKTOTAL, CKMB, CKMBINDEX, TROPONINI in the last 168 hours. BNP: BNP (last 3 results) No results for input(s): BNP in the last 8760 hours.  ProBNP (last 3 results) No results for input(s): PROBNP in the last 8760 hours.  CBG: Recent Labs  Lab 03/30/21 1614 03/30/21 2001 03/30/21 2339 03/31/21 0353 03/31/21 0745  GLUCAP 194* 166* 149* 163* 180*       Signed:  Dia Crawford, MD [facility]

## 2021-03-31 NOTE — Progress Notes (Signed)
-   Patient seen and examined at bedside in the emergency room.  -Rectal bleeding has improved significantly.  Now seeing streaks of blood on the tissue paper.  Complaining of mild left upper quadrant discomfort which is positional.  Denies nausea and vomiting.  -On exam, resting comfortably.  Not in acute distress.  Unchanged bruise on the right lower quadrant, otherwise abdominal exam benign.  Labs reviewed.  Hemoglobin stable  Recommendations ---------------------- -Okay to discharge from GI standpoint later today -Anusol suppository for total 14 days -Avoid NSAIDs -Call us back if needed  Otis Brace MD, [place] 03/31/2021, 10:12 AM  Contact #  [phone]

## 2021-04-06 ENCOUNTER — Other Ambulatory Visit (HOSPITAL_COMMUNITY): Payer: Self-pay

## 2021-04-07 ENCOUNTER — Other Ambulatory Visit: Payer: Self-pay

## 2021-04-07 ENCOUNTER — Ambulatory Visit: Payer: Medicare Other | Admitting: Podiatry

## 2021-04-07 DIAGNOSIS — M7661 Achilles tendinitis, right leg: Secondary | ICD-10-CM | POA: Diagnosis not present

## 2021-04-07 DIAGNOSIS — M7742 Metatarsalgia, left foot: Secondary | ICD-10-CM | POA: Diagnosis not present

## 2021-04-07 NOTE — Progress Notes (Signed)
  Subjective:  Patient ID: Janice Payne, female    DOB: 1946-07-18,  MRN: [id number]  Chief Complaint  Patient presents with   Foot Pain    1 month follow up right achilles pain. Pt states improvement but requested an injection. Pt states she did not wear the cam boot much at all.     75 y.o. female presents with the above complaint. History confirmed with patient. Overall trying to do stretches and feels the ankle is doing better. Objective:  Physical Exam: warm, good capillary refill, no trophic changes or ulcerative lesions, normal DP and PT pulses, and normal sensory exam. Left Foot: POP left 4th met head  Right Foot: POP posterior Achilles, decreased AJ rom, + Silvershoild test, prominent posterior spur  Assessment:   1. Tendonitis, Achilles, right   2. Metatarsalgia of left foot    Plan:  Patient was evaluated and treated and all questions answered.  Achilles Tendonitis -Discussed continued rest, stretching. -Patient does not wish to wear boot -Discussed we do not do injectable steroids for this issue. -Unable to tolerate NSAIDs given CKD -F/u should issues persist.   No follow-ups on file.

## 2022-05-18 IMAGING — CR DG ABDOMEN 2V
2 series · 2 of 2 positions shown · non-contrast
Comparison: None.

CLINICAL DATA: Abdominal pain.  Status post colonoscopy today.

EXAM:
ABDOMEN - 2 VIEW

[t abdomen supine]
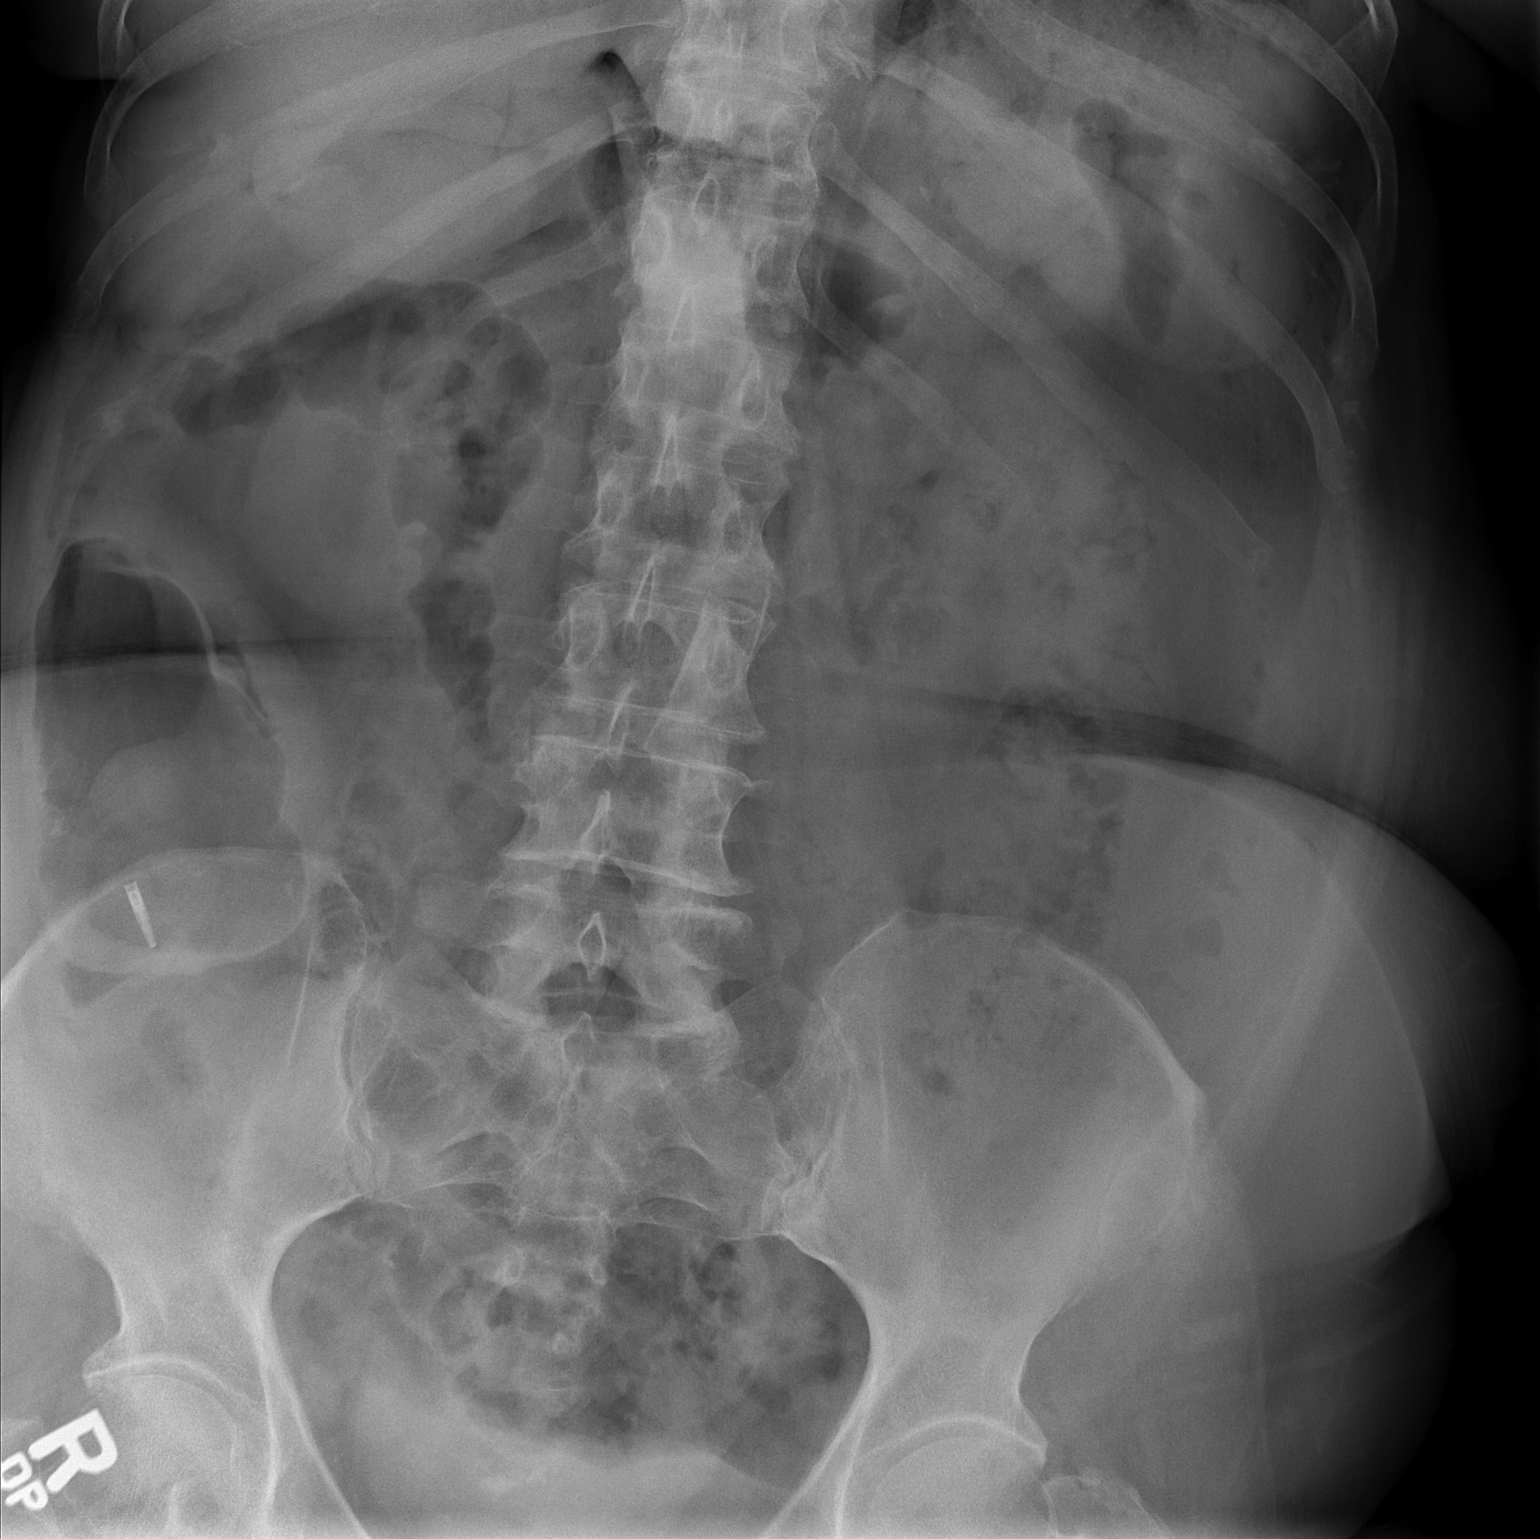

[w abdomen upright]
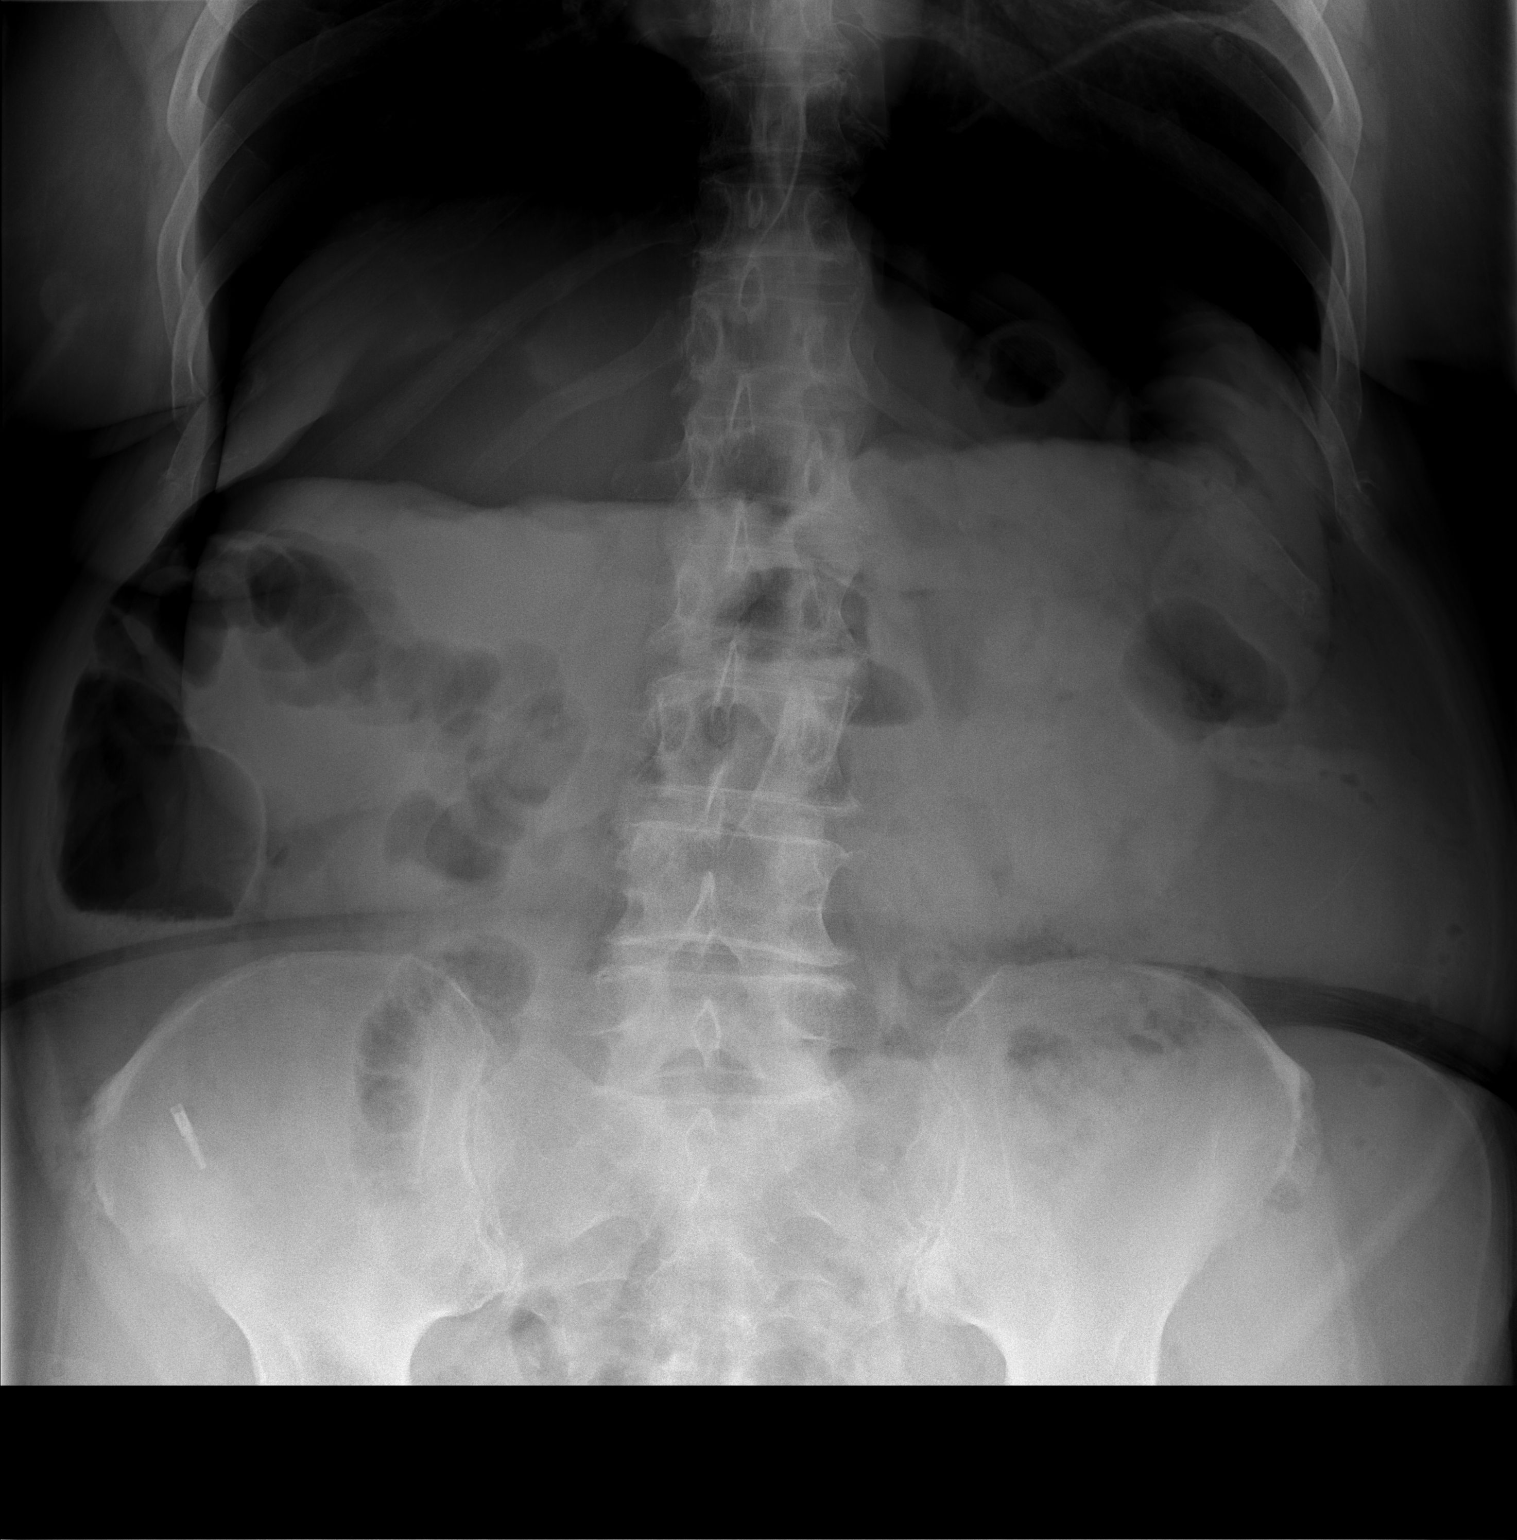

[2 of 2 positions shown; findings below may reference images not displayed]

FINDINGS: No evidence of dilated bowel loops, however a moderate to large
amount of free intraperitoneal air is seen, consistent with bowel
perforation.
IMPRESSION: Moderate to large amount of free intraperitoneal air, consistent
with bowel perforation. No evidence of bowel obstruction.

These results were called by telephone at the time of interpretation
on 03/16/2021 at [DATE] to provider Dr. Robbin Tam nurse
Rodani , who verbally acknowledged these results.

## 2022-05-18 IMAGING — CT CT ABD-PELV W/O CM
2 of 4 series · 15 of 46 positions shown, 17 images · non-contrast
Comparison: CT 01/30/2014

CLINICAL DATA: Intraperitoneal free air none on radiograph
underwent screening colonoscopy after an acute episode of
diverticulitis several months prior. Fall ectomy performed.

EXAM:
CT ABDOMEN AND PELVIS WITHOUT CONTRAST
TECHNIQUE: Multidetector CT imaging of the abdomen and pelvis was performed
following the standard protocol without IV contrast.

[Series 3: a/p w/o 5mm · axial · non-contrast · 0.96mm/px · z∈[-496,-56]mm · 12 of 101 slices shown, 14 images]
[im 9/101  soft-tissue]
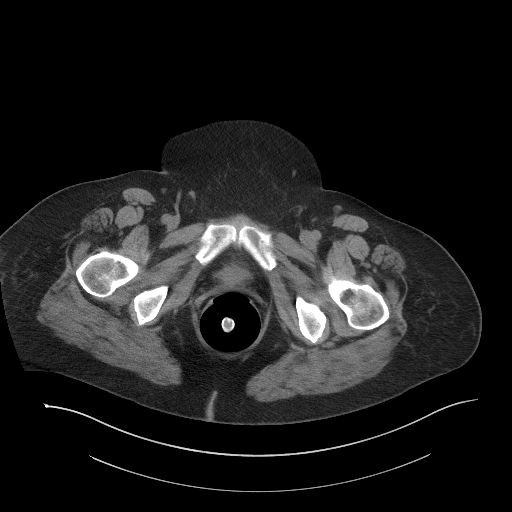
[im 9/101  bone]
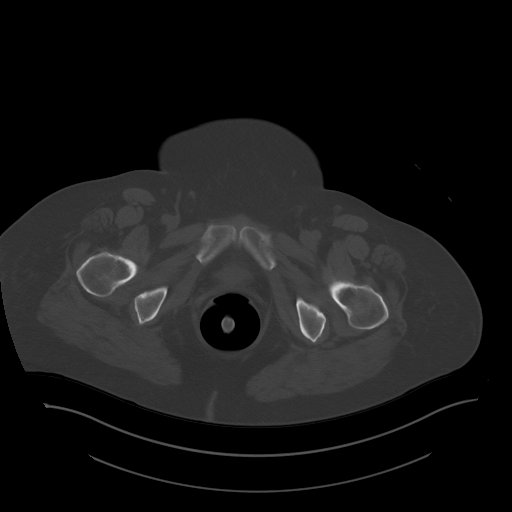
[im 17/101  soft-tissue]
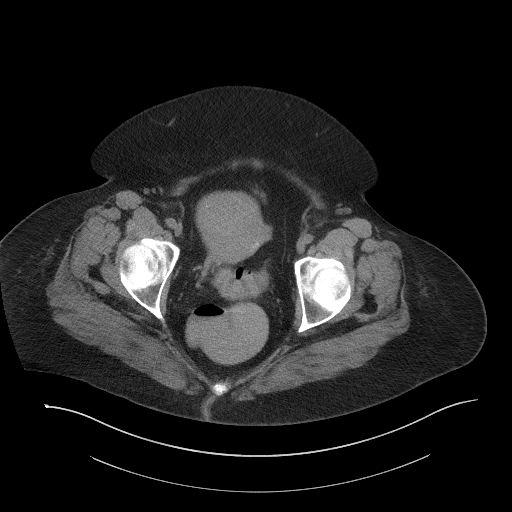
[im 25/101  soft-tissue]
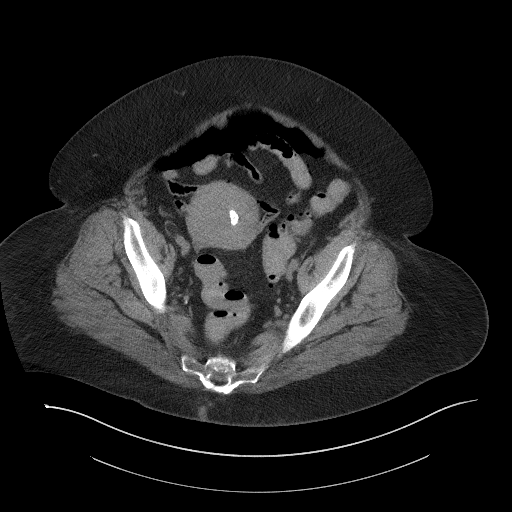
[im 33/101  soft-tissue]
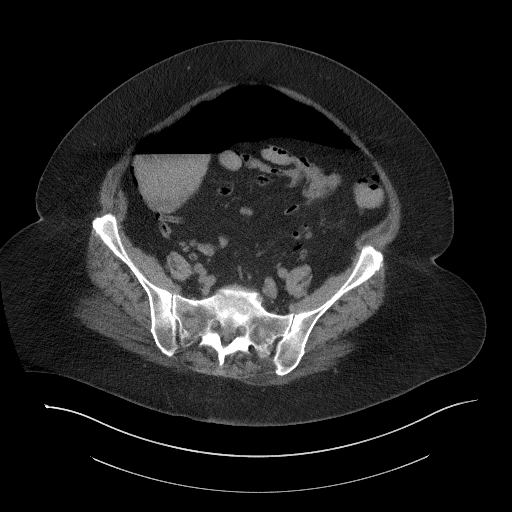
[im 41/101  soft-tissue]
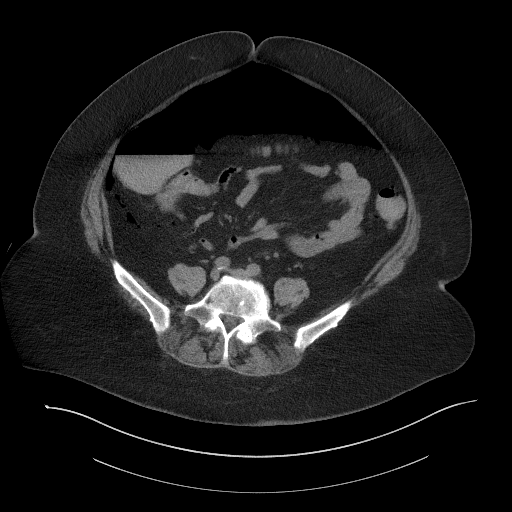
[im 49/101  soft-tissue]
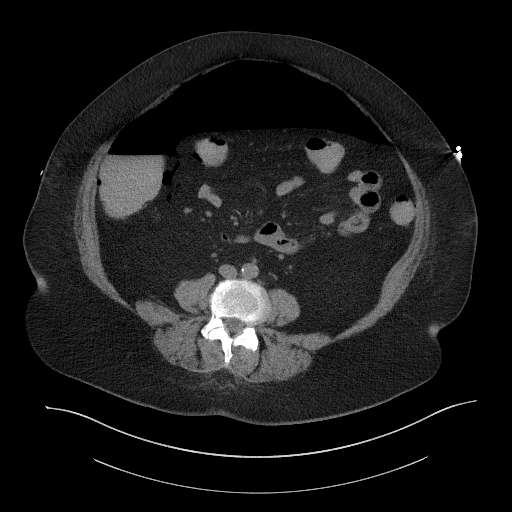
[im 57/101  soft-tissue]
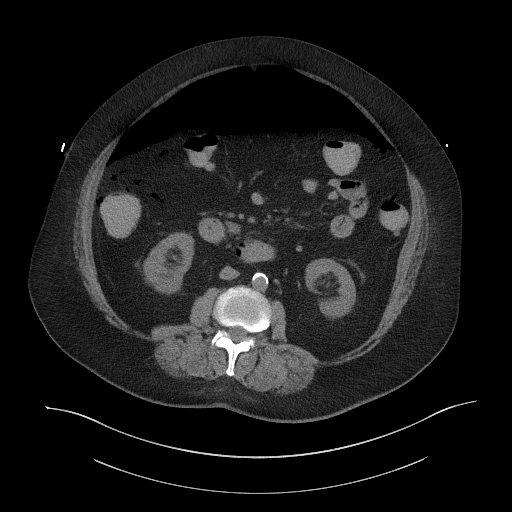
[im 65/101  soft-tissue]
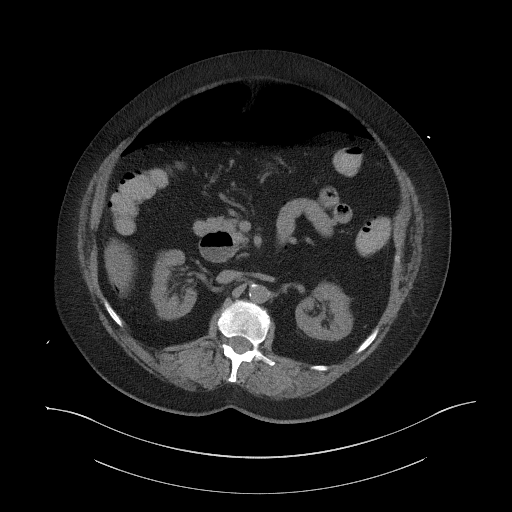
[im 73/101  soft-tissue]
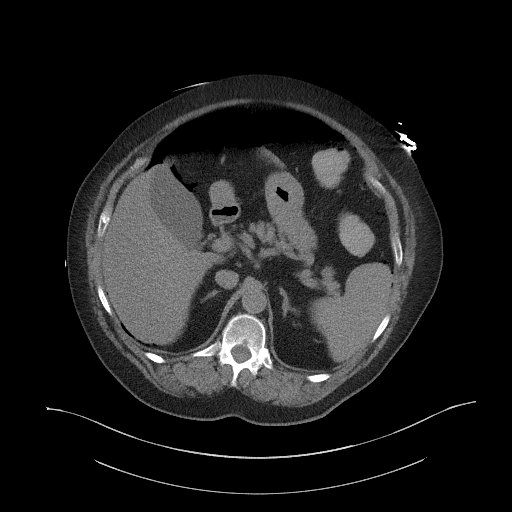
[im 73/101  bone]
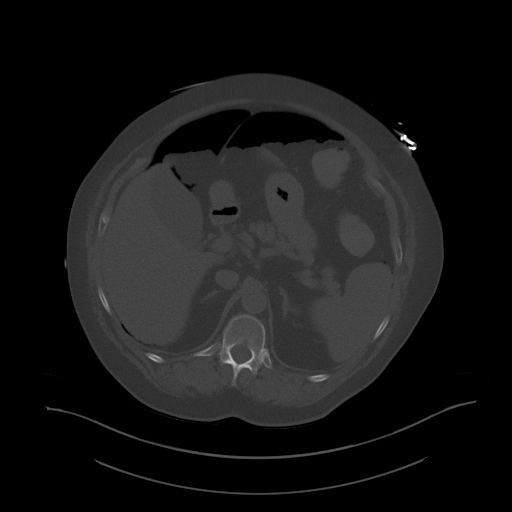
[im 81/101  soft-tissue]
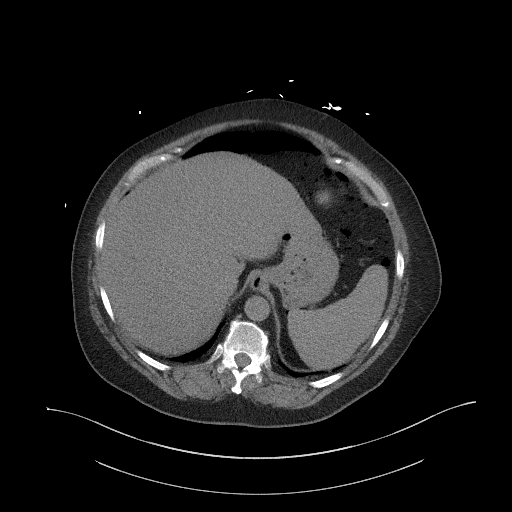
[im 89/101  soft-tissue]
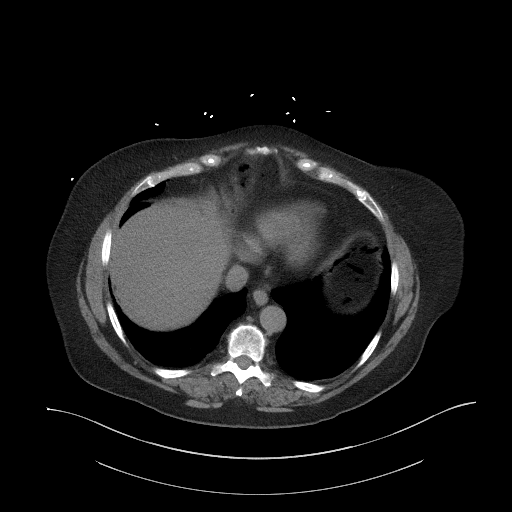
[im 97/101  soft-tissue]
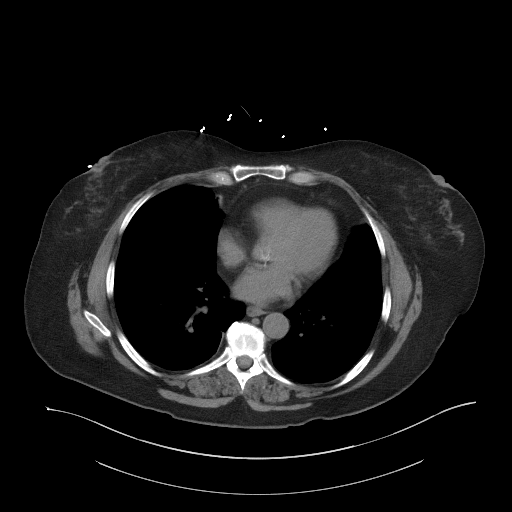

[Series 6: a/p w/o cor · coronal · non-contrast · 1.03mm/px · 3 of 190 slices shown]
[im 64/190  soft-tissue]
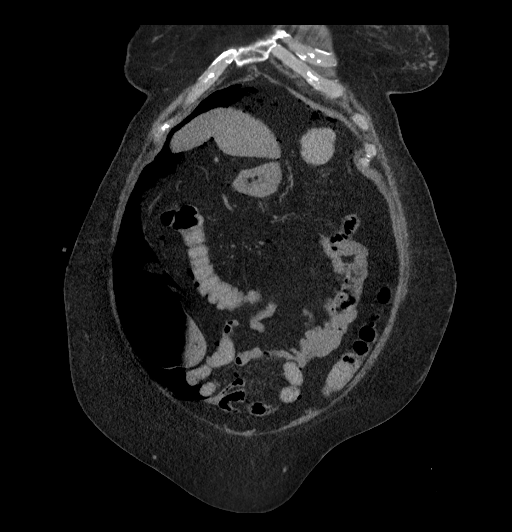
[im 85/190  soft-tissue]
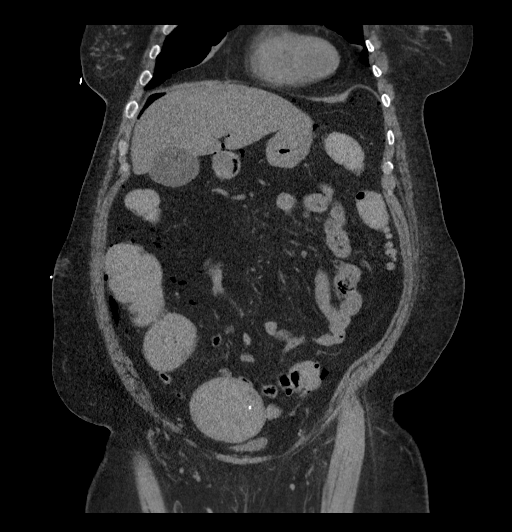
[im 106/190  soft-tissue]
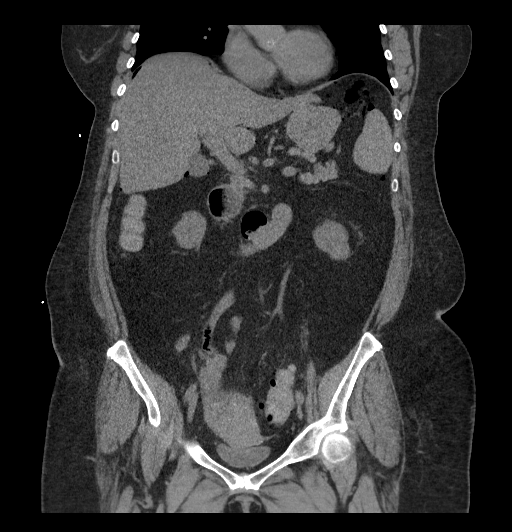

[15 of 46 positions shown; findings below may reference images not displayed]

FINDINGS: Lower chest: Atelectatic changes otherwise clear lung bases. Normal
heart size. No pericardial effusion. Coronary calcifications are
noted as well as additional calcifications on the aortic leaflets.

Hepatobiliary: No worrisome focal liver lesions. Smooth liver
surface contour. Normal hepatic attenuation. Normal gallbladder and
biliary tree.

Pancreas: No pancreatic ductal dilatation or surrounding
inflammatory changes.

Spleen: Normal in size. No concerning splenic lesions.

Adrenals/Urinary Tract: Normal adrenals. Mild bilateral renal
atrophy. Two small 11 mm hypoattenuating foci in the lower pole
right kidney, favor small cysts. Additional probable parapelvic cyst
on the left, too small to fully characterize. No concerning visible
or contour deforming renal lesion. Mild symmetric bilateral
perinephric stranding, a nonspecific finding which may correlate
with advanced age or decreased renal function. No obstructive
urolithiasis or hydronephrosis.

Stomach/Bowel: Distal esophagus and stomach are unremarkable. Air
and fluid-filled duodenal diverticula seen extending posteriorly
from the second and third portions of the duodenum measuring up to
3.5 and 2.5 cm respectively. No associated inflammation. Duodenum is
otherwise unremarkable. No small bowel thickening or dilatation.
Normal appendix seen in the right lower quadrant extending
retrocecal E from the cecal tip. Few foci of adjacent gas albeit
without spillage of fluid, or adjacent inflammation to suggest
perforation at this site. Surgical clip noted at the tip of the
cecum (3/66) correlate with procedure report for the presumed site
of of polypectomy performed today. While there is gas around much of
the cecum, no spillage of contrast or focal inflammation is evident
to suggest perforation at this level either. Colon is diffusely
fluid-filled with dilute enteric contrast media. No discernible
sites of contrast spillage, colonic inflammation nor inflammation on
the additional numerous, predominantly distal colonic diverticula.

Vascular/Lymphatic: Atherosclerotic calcifications within the
abdominal aorta and branch vessels. No aneurysm or ectasia. No
enlarged abdominopelvic lymph nodes.

Reproductive: Anteverted, fibroid uterus with multiple partially
calcified uterine fibroids. No concerning adnexal mass or lesion.

Other: Large volume of pneumoperitoneum. No free fluid or spillage
of administered enteric contrast media. No organized collection or
abscess.

Musculoskeletal: No acute osseous abnormality or suspicious osseous
lesion. Multilevel degenerative changes are present in the imaged
portions of the spine.
IMPRESSION: Metallic clip noted at the cecum, likely to correspond to site of
patient's same day polypectomy. While a small micro perforation
could exist in this vicinity, no discrete CT abnormality to is seen
in this vicinity or elsewhere to provide a distinct identifiable
source of the patient's large volume of pneumoperitoneum. No other
worrisome features to suggest spillage of bowel succus or a frank
peritonitis. Recommend correlation with clinical exam findings and
patient clinical status.

Diverticulosis without evidence acute diverticulitis.

Fibroid uterus.

Aortic Atherosclerosis ([id number]).

## 2022-08-23 HISTORY — PX: OTHER SURGICAL HISTORY: SHX169

## 2023-02-23 ENCOUNTER — Inpatient Hospital Stay (HOSPITAL_COMMUNITY): Payer: Medicare Other

## 2023-02-23 ENCOUNTER — Emergency Department (HOSPITAL_COMMUNITY): Payer: Medicare Other

## 2023-02-23 ENCOUNTER — Inpatient Hospital Stay (HOSPITAL_COMMUNITY)
Admission: EM | Admit: 2023-02-23 | Discharge: 2023-02-27 | DRG: 100 | Disposition: A | Discharge status: Home Health | Payer: Medicare Other | Attending: Internal Medicine | Admitting: Internal Medicine

## 2023-02-23 ENCOUNTER — Encounter (HOSPITAL_COMMUNITY): Payer: Self-pay | Admitting: Internal Medicine

## 2023-02-23 ENCOUNTER — Other Ambulatory Visit: Payer: Self-pay

## 2023-02-23 DIAGNOSIS — E041 Nontoxic single thyroid nodule: Secondary | ICD-10-CM | POA: Diagnosis not present

## 2023-02-23 DIAGNOSIS — G9341 Metabolic encephalopathy: Secondary | ICD-10-CM

## 2023-02-23 DIAGNOSIS — W06XXXA Fall from bed, initial encounter: Secondary | ICD-10-CM | POA: Diagnosis present

## 2023-02-23 DIAGNOSIS — E871 Hypo-osmolality and hyponatremia: Secondary | ICD-10-CM | POA: Diagnosis present

## 2023-02-23 DIAGNOSIS — Z87442 Personal history of urinary calculi: Secondary | ICD-10-CM

## 2023-02-23 DIAGNOSIS — I129 Hypertensive chronic kidney disease with stage 1 through stage 4 chronic kidney disease, or unspecified chronic kidney disease: Secondary | ICD-10-CM | POA: Diagnosis present

## 2023-02-23 DIAGNOSIS — E1122 Type 2 diabetes mellitus with diabetic chronic kidney disease: Secondary | ICD-10-CM | POA: Diagnosis present

## 2023-02-23 DIAGNOSIS — Z888 Allergy status to other drugs, medicaments and biological substances status: Secondary | ICD-10-CM | POA: Diagnosis not present

## 2023-02-23 DIAGNOSIS — K219 Gastro-esophageal reflux disease without esophagitis: Secondary | ICD-10-CM | POA: Diagnosis present

## 2023-02-23 DIAGNOSIS — Z8249 Family history of ischemic heart disease and other diseases of the circulatory system: Secondary | ICD-10-CM

## 2023-02-23 DIAGNOSIS — W19XXXA Unspecified fall, initial encounter: Secondary | ICD-10-CM

## 2023-02-23 DIAGNOSIS — J45909 Unspecified asthma, uncomplicated: Secondary | ICD-10-CM | POA: Diagnosis present

## 2023-02-23 DIAGNOSIS — G40909 Epilepsy, unspecified, not intractable, without status epilepticus: Secondary | ICD-10-CM

## 2023-02-23 DIAGNOSIS — R4701 Aphasia: Secondary | ICD-10-CM | POA: Diagnosis present

## 2023-02-23 DIAGNOSIS — Z885 Allergy status to narcotic agent status: Secondary | ICD-10-CM

## 2023-02-23 DIAGNOSIS — N390 Urinary tract infection, site not specified: Secondary | ICD-10-CM | POA: Diagnosis present

## 2023-02-23 DIAGNOSIS — G8929 Other chronic pain: Secondary | ICD-10-CM | POA: Diagnosis present

## 2023-02-23 DIAGNOSIS — M549 Dorsalgia, unspecified: Secondary | ICD-10-CM | POA: Diagnosis present

## 2023-02-23 DIAGNOSIS — T43591A Poisoning by other antipsychotics and neuroleptics, accidental (unintentional), initial encounter: Secondary | ICD-10-CM | POA: Diagnosis present

## 2023-02-23 DIAGNOSIS — R569 Unspecified convulsions: Secondary | ICD-10-CM | POA: Diagnosis not present

## 2023-02-23 DIAGNOSIS — Z6838 Body mass index (BMI) 38.0-38.9, adult: Secondary | ICD-10-CM

## 2023-02-23 DIAGNOSIS — I10 Essential (primary) hypertension: Secondary | ICD-10-CM | POA: Diagnosis not present

## 2023-02-23 DIAGNOSIS — Z7984 Long term (current) use of oral hypoglycemic drugs: Secondary | ICD-10-CM

## 2023-02-23 DIAGNOSIS — K2100 Gastro-esophageal reflux disease with esophagitis, without bleeding: Secondary | ICD-10-CM

## 2023-02-23 DIAGNOSIS — E860 Dehydration: Secondary | ICD-10-CM | POA: Diagnosis present

## 2023-02-23 DIAGNOSIS — R29705 NIHSS score 5: Secondary | ICD-10-CM | POA: Diagnosis present

## 2023-02-23 DIAGNOSIS — E785 Hyperlipidemia, unspecified: Secondary | ICD-10-CM | POA: Diagnosis present

## 2023-02-23 DIAGNOSIS — R682 Dry mouth, unspecified: Secondary | ICD-10-CM | POA: Diagnosis present

## 2023-02-23 DIAGNOSIS — E1129 Type 2 diabetes mellitus with other diabetic kidney complication: Secondary | ICD-10-CM | POA: Diagnosis present

## 2023-02-23 DIAGNOSIS — Z794 Long term (current) use of insulin: Secondary | ICD-10-CM | POA: Diagnosis not present

## 2023-02-23 DIAGNOSIS — E669 Obesity, unspecified: Secondary | ICD-10-CM | POA: Diagnosis present

## 2023-02-23 DIAGNOSIS — E1121 Type 2 diabetes mellitus with diabetic nephropathy: Secondary | ICD-10-CM | POA: Diagnosis not present

## 2023-02-23 DIAGNOSIS — E876 Hypokalemia: Secondary | ICD-10-CM | POA: Diagnosis present

## 2023-02-23 DIAGNOSIS — Z880 Allergy status to penicillin: Secondary | ICD-10-CM | POA: Diagnosis not present

## 2023-02-23 DIAGNOSIS — J690 Pneumonitis due to inhalation of food and vomit: Secondary | ICD-10-CM | POA: Diagnosis not present

## 2023-02-23 DIAGNOSIS — R2990 Unspecified symptoms and signs involving the nervous system: Secondary | ICD-10-CM

## 2023-02-23 DIAGNOSIS — Y92009 Unspecified place in unspecified non-institutional (private) residence as the place of occurrence of the external cause: Secondary | ICD-10-CM | POA: Diagnosis not present

## 2023-02-23 DIAGNOSIS — Z79899 Other long term (current) drug therapy: Secondary | ICD-10-CM | POA: Diagnosis not present

## 2023-02-23 DIAGNOSIS — M1990 Unspecified osteoarthritis, unspecified site: Secondary | ICD-10-CM | POA: Diagnosis present

## 2023-02-23 DIAGNOSIS — R531 Weakness: Principal | ICD-10-CM

## 2023-02-23 DIAGNOSIS — J40 Bronchitis, not specified as acute or chronic: Secondary | ICD-10-CM | POA: Diagnosis not present

## 2023-02-23 DIAGNOSIS — E1142 Type 2 diabetes mellitus with diabetic polyneuropathy: Secondary | ICD-10-CM | POA: Diagnosis present

## 2023-02-23 DIAGNOSIS — N1832 Chronic kidney disease, stage 3b: Secondary | ICD-10-CM | POA: Diagnosis present

## 2023-02-23 LAB — DIFFERENTIAL
Abs Immature Granulocytes: 0.1 10*3/uL — ABNORMAL HIGH (ref 0.00–0.07)
Basophils Absolute: 0 10*3/uL (ref 0.0–0.1)
Basophils Relative: 1 %
Eosinophils Absolute: 0 10*3/uL (ref 0.0–0.5)
Eosinophils Relative: 0 %
Immature Granulocytes: 1 %
Lymphocytes Relative: 17 %
Lymphs Abs: 1.4 10*3/uL (ref 0.7–4.0)
Monocytes Absolute: 0.5 10*3/uL (ref 0.1–1.0)
Monocytes Relative: 6 %
Neutro Abs: 6.2 10*3/uL (ref 1.7–7.7)
Neutrophils Relative %: 75 %

## 2023-02-23 LAB — COMPREHENSIVE METABOLIC PANEL
ALT: 14 U/L (ref 0–44)
AST: 16 U/L (ref 15–41)
Albumin: 3.5 g/dL (ref 3.5–5.0)
Alkaline Phosphatase: 34 U/L — ABNORMAL LOW (ref 38–126)
Anion gap: 11 (ref 5–15)
BUN: 14 mg/dL (ref 8–23)
CO2: 24 mmol/L (ref 22–32)
Calcium: 9.2 mg/dL (ref 8.9–10.3)
Chloride: 97 mmol/L — ABNORMAL LOW (ref 98–111)
Creatinine, Ser: 1.52 mg/dL — ABNORMAL HIGH (ref 0.44–1.00)
GFR, Estimated: 35 mL/min — ABNORMAL LOW (ref >60)
Glucose, Bld: 129 mg/dL — ABNORMAL HIGH (ref 70–99)
Potassium: 3.5 mmol/L (ref 3.5–5.1)
Sodium: 132 mmol/L — ABNORMAL LOW (ref 135–145)
Total Bilirubin: 1 mg/dL (ref 0.3–1.2)
Total Protein: 7 g/dL (ref 6.5–8.1)

## 2023-02-23 LAB — CBC
HCT: 37.2 % (ref 36.0–46.0)
Hemoglobin: 12.5 g/dL (ref 12.0–15.0)
MCH: 28.9 pg (ref 26.0–34.0)
MCHC: 33.6 g/dL (ref 30.0–36.0)
MCV: 86.1 fL (ref 80.0–100.0)
Platelets: 162 10*3/uL (ref 150–400)
RBC: 4.32 MIL/uL (ref 3.87–5.11)
RDW: 12.5 % (ref 11.5–15.5)
WBC: 8.3 10*3/uL (ref 4.0–10.5)
nRBC: 0 % (ref 0.0–0.2)

## 2023-02-23 LAB — GLUCOSE, CAPILLARY: Glucose-Capillary: 184 mg/dL — ABNORMAL HIGH (ref 70–99)

## 2023-02-23 LAB — I-STAT CHEM 8, ED
BUN: 15 mg/dL (ref 8–23)
Calcium, Ion: 1.18 mmol/L (ref 1.15–1.40)
Chloride: 99 mmol/L (ref 98–111)
Creatinine, Ser: 1.6 mg/dL — ABNORMAL HIGH (ref 0.44–1.00)
Glucose, Bld: 124 mg/dL — ABNORMAL HIGH (ref 70–99)
HCT: 37 % (ref 36.0–46.0)
Hemoglobin: 12.6 g/dL (ref 12.0–15.0)
Potassium: 3.6 mmol/L (ref 3.5–5.1)
Sodium: 135 mmol/L (ref 135–145)
TCO2: 26 mmol/L (ref 22–32)

## 2023-02-23 LAB — URINALYSIS, ROUTINE W REFLEX MICROSCOPIC
Bilirubin Urine: NEGATIVE
Glucose, UA: NEGATIVE mg/dL
Ketones, ur: NEGATIVE mg/dL
Nitrite: NEGATIVE
Protein, ur: NEGATIVE mg/dL
Specific Gravity, Urine: 1.039 — ABNORMAL HIGH (ref 1.005–1.030)
pH: 6 (ref 5.0–8.0)

## 2023-02-23 LAB — PROTIME-INR
INR: 1 (ref 0.8–1.2)
Prothrombin Time: 13.5 seconds (ref 11.4–15.2)

## 2023-02-23 LAB — ETHANOL: Alcohol, Ethyl (B): <10 mg/dL (ref <10)

## 2023-02-23 LAB — CK: Total CK: 102 U/L (ref 38–234)

## 2023-02-23 LAB — MAGNESIUM: Magnesium: 1.8 mg/dL (ref 1.7–2.4)

## 2023-02-23 LAB — RAPID URINE DRUG SCREEN, HOSP PERFORMED
Amphetamines: NOT DETECTED
Barbiturates: NOT DETECTED
Benzodiazepines: NOT DETECTED
Cocaine: NOT DETECTED
Opiates: NOT DETECTED
Tetrahydrocannabinol: NOT DETECTED

## 2023-02-23 LAB — PHOSPHORUS: Phosphorus: 2.4 mg/dL — ABNORMAL LOW (ref 2.5–4.6)

## 2023-02-23 LAB — HEMOGLOBIN A1C
Hgb A1c MFr Bld: 7.1 % — ABNORMAL HIGH (ref 4.8–5.6)
Mean Plasma Glucose: 157.07 mg/dL

## 2023-02-23 LAB — APTT: aPTT: 25 seconds (ref 24–36)

## 2023-02-23 MED ORDER — SODIUM CHLORIDE 0.9 % IV SOLN: 2000.0000 mg | Freq: Once | INTRAVENOUS | Status: DC

## 2023-02-23 MED ORDER — LEVETIRACETAM IN NACL 1000 MG/100ML IV SOLN
1000.0000 mg | Freq: Once | INTRAVENOUS | Status: AC
  Filled 2023-02-23: qty 100

## 2023-02-23 MED ORDER — LORAZEPAM 2 MG/ML IJ SOLN: 4.0000 mg | INTRAMUSCULAR | Status: DC | PRN

## 2023-02-23 MED ORDER — HYDRALAZINE HCL 20 MG/ML IJ SOLN: 10.0000 mg | Freq: Four times a day (QID) | INTRAMUSCULAR | Status: DC | PRN

## 2023-02-23 MED ORDER — ACETAMINOPHEN 325 MG PO TABS
650.0000 mg | ORAL_TABLET | ORAL | Status: DC | PRN
  Administered 2023-02-25 (×3): 650 mg via ORAL
  Filled 2023-02-23 (×3): qty 2

## 2023-02-23 MED ORDER — ACETAMINOPHEN 650 MG RE SUPP: 650.0000 mg | RECTAL | Status: DC | PRN

## 2023-02-23 MED ORDER — INSULIN ASPART 100 UNIT/ML IJ SOLN
0.0000 [IU] | INTRAMUSCULAR | Status: DC
  Administered 2023-02-24: 2 [IU] via SUBCUTANEOUS
  Administered 2023-02-24: 3 [IU] via SUBCUTANEOUS
  Administered 2023-02-24: 2 [IU] via SUBCUTANEOUS
  Administered 2023-02-24: 1 [IU] via SUBCUTANEOUS
  Administered 2023-02-24 (×2): 2 [IU] via SUBCUTANEOUS
  Administered 2023-02-25: 3 [IU] via SUBCUTANEOUS
  Administered 2023-02-25: 2 [IU] via SUBCUTANEOUS

## 2023-02-23 MED ORDER — SODIUM CHLORIDE 0.9 % IV SOLN: 250.0000 mL | INTRAVENOUS | Status: DC | PRN

## 2023-02-23 MED ORDER — SODIUM CHLORIDE 0.9 % IV SOLN
2000.0000 mg | Freq: Once | INTRAVENOUS | Status: AC
  Administered 2023-02-23: 2000 mg via INTRAVENOUS
  Filled 2023-02-23: qty 20

## 2023-02-23 MED ORDER — LACTATED RINGERS IV SOLN: INTRAVENOUS | Status: DC

## 2023-02-23 MED ORDER — ONDANSETRON HCL 4 MG PO TABS: 4.0000 mg | ORAL_TABLET | Freq: Four times a day (QID) | ORAL | Status: DC | PRN

## 2023-02-23 MED ORDER — SODIUM CHLORIDE 0.9% FLUSH: 3.0000 mL | INTRAVENOUS | Status: DC | PRN

## 2023-02-23 MED ORDER — ONDANSETRON HCL 4 MG/2ML IJ SOLN
4.0000 mg | Freq: Four times a day (QID) | INTRAMUSCULAR | Status: DC | PRN
  Administered 2023-02-25 – 2023-02-27 (×4): 4 mg via INTRAVENOUS
  Filled 2023-02-23 (×5): qty 2

## 2023-02-23 MED ORDER — SODIUM CHLORIDE 0.9% FLUSH
3.0000 mL | Freq: Two times a day (BID) | INTRAVENOUS | Status: DC
  Administered 2023-02-24 – 2023-02-25 (×2): 3 mL via INTRAVENOUS

## 2023-02-23 MED ORDER — CHLORHEXIDINE GLUCONATE 0.12% ORAL RINSE (MEDLINE KIT)
15.0000 mL | Freq: Two times a day (BID) | OROMUCOSAL | Status: DC
  Administered 2023-02-24 – 2023-02-25 (×3): 15 mL via OROMUCOSAL

## 2023-02-23 MED ORDER — ORAL CARE MOUTH RINSE
15.0000 mL | OROMUCOSAL | Status: DC
  Administered 2023-02-23 – 2023-02-25 (×9): 15 mL via OROMUCOSAL

## 2023-02-23 MED ORDER — IOHEXOL 350 MG/ML SOLN
75.0000 mL | Freq: Once | INTRAVENOUS | Status: AC | PRN
  Administered 2023-02-23: 75 mL via INTRAVENOUS

## 2023-02-23 NOTE — H&P (Addendum)
History and Physical    Janice Payne VHQ:[id number] DOB: 11/21/1945 DOA: 02/23/2023  ////DOS: the patient was seen and examined on 02/23/2023  PCP: Rebecka Apley, NP   Patient coming from: Home   Chief Complaint:  Chief Complaint  Patient presents with   Fall   Weakness    HPI:  77 year old female medical history of hypertension, diabetes mellitus type 2, hyperlipidemia, GERD, who presented to emergency department with complaint of unable to speak fluently.  Initial presentation was concern for stroke given patient has multiple risk factor open stroke. Per patient family last known normal 7 PM 02/22/2023.  Patient was last seen normal around the dinner at 7 PM.  In the middle of the night patient fell next to the bed and sat down for 2 hours before calling her son for help.  Son and daughter-in-law could not pick up the patient and called EMS who helped heart in the bed and vitals are okay on initial presentation.  Family left a.m. for work.  Other son got to house and he found the patient very of.  Did not know how to use the phone and speech found more hesitant.  They called EMS again and brought to ED for evaluation.  On initial presentation to ED due to expressive aphasia code stroke called.  CT head ruled out any acute intracranial abnormality.  CT head and neck no emergent large vessel occlusion or stenosis.  During my evaluation, patient is unable to express any words.  She is able to follow command.  I have noted she has occasional jerking of the upper extremity every 2 to 3 minutes.  Patient's daughter-in-law at bedside reported patient has chronic back pain and she is not comfortable with the position in the bed.  Unable to get any much history from the patient at this point.  ED Course:  Initial presentation to ED patient's vital stable except slightly rated blood pressure 146/70. CBC unremarkable.  CMP shows slightly low sodium 132, creatinine 1.5 which is around patient  baseline. CK 102 within normal range.  UDS negative.  UA showed rare bacteria and leukocyte esterase positive.  CT head negative and no evidence of LVO on CTA. Initial presentation to ED patient has generalized tonic-clonic seizure and now postictal.  Neurology evaluated patient as she is outside of the window and is not a candidate to offer thrombectomy secondary to no large vessel occlusion.  Neurology recommended MRI with contrast, routine EEG, Keppra load 2000 mg IV once and further assessment and plan based on EEG and MRI finding.  Review of Systems:  Review of Systems  Unable to perform ROS: Acuity of condition    Past Medical History:  Diagnosis Date   Arthritis    left hip, neck   Dental crown present    Difficult airway    Dry mouth    GERD (gastroesophageal reflux disease)    History of asthma    no current problems or med.   History of kidney stones    Hyperlipidemia    Hypertension    under control with meds.   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 Cornett, MD;  Location: [facility];  Service: General;  Laterality: Right;  EXTRACORPOREAL SHOCK WAVE LITHOTRIPSY  11/27/2012   LAPAROTOMY N/A 03/16/2021   Procedure: Diagnostic Laparoscopy, Exploratory Laparotomy with colon repair;  Surgeon: Fritzi Mandes, MD;  Location: [facility] OR;  Service: General;  Laterality: N/A;   TUBAL LIGATION       reports that she has never smoked. She has never used smokeless tobacco. She reports that she does not drink alcohol and does not use drugs.  Allergies  Allergen Reactions   Niacin Itching and Rash   Codeine Nausea And Vomiting and Nausea Only   Niaspan [Niacin Er] Nausea And Vomiting   Penicillins Rash    Family  History  Problem Relation Age of Onset   Heart disease Father     Prior to Admission medications   Medication Sig Start Date End Date Taking? Authorizing Provider  acetaminophen (TYLENOL) 325 MG tablet Take 650 mg by mouth every 6 (six) hours as needed for mild pain, fever or headache.    [provider]  ALPRAZolam Prudy Feeler) 0.5 MG tablet Take 0.5 mg by mouth at bedtime as needed for sleep.    [provider]  Blood Glucose Monitoring Suppl (ONE TOUCH ULTRA 2) w/Device KIT Use to check blood sugar as directed 06/18/20   [provider]  cetirizine (ZYRTEC) 10 MG tablet Take 10 mg by mouth at bedtime.    [provider]  Cholecalciferol (VITAMIN D) 2000 UNITS tablet Take 2,000 Units by mouth daily.    [provider]  clotrimazole-betamethasone (LOTRISONE) cream Apply 1 application topically 2 (two) times daily as needed (rash/irritation).    [provider]  fenofibrate 160 MG tablet Take 160 mg by mouth daily before breakfast.    [provider]  gabapentin (NEURONTIN) 300 MG capsule Take 300 mg by mouth 2 (two) times daily. 11/05/20   [provider]  glucagon 1 MG injection Follow package directions for low blood sugar. 03/15/17   [provider]  Glucose Blood (BLOOD GLUCOSE TEST STRIPS) STRP Test blood sugars twice daily. Dispense strips for Onetouch Ultra 2 06/18/20   [provider]  hydrochlorothiazide (HYDRODIURIL) 12.5 MG tablet Take 12.5 mg by mouth daily. 04/29/17   [provider]  hydrocortisone (ANUSOL-HC) 25 MG suppository Place 1 suppository (25 mg total) rectally 2 (two) times daily. 03/31/21   Drema Dallas, MD  insulin NPH-regular Human (NOVOLIN 70/30) (70-30) 100 UNIT/ML injection Inject 30-72 Units into the skin 2 (two) times daily with a meal. Inject 72 units in the morning and 30 units at bedtime 02/22/17   [provider]  Insulin Pen Needle 31G X 6 MM MISC See admin  instructions. 11/03/16   [provider]  methocarbamol (ROBAXIN) 500 MG tablet Take 1 tablet (500 mg total) by mouth every 8 (eight) hours as needed for muscle spasms. 03/22/21   Andria Meuse, MD  metoprolol succinate (TOPROL-XL) 50 MG 24 hr tablet Take 50 mg by mouth daily before breakfast. Take with or immediately following a meal.    [provider]  oxyCODONE (OXY IR/ROXICODONE) 5 MG immediate release tablet Take 1-2 tablets (5-10 mg total) by mouth every 4 (four) hours as needed. 03/22/21   Andria Meuse, MD  pantoprazole (PROTONIX) 40 MG tablet Take 2 tablets (80 mg total) by mouth daily. 04/01/21   Drema Dallas, MD  pravastatin (PRAVACHOL) 20 MG tablet Take 20 mg by mouth at bedtime.     [provider]  sertraline (ZOLOFT) 50 MG tablet Take 50 mg  by mouth daily. 11/05/20   [provider]     Physical Exam: Vitals:   02/23/23 2151 02/23/23 2240 02/23/23 2242 02/23/23 2356  BP: (!) 117/93 (!) 75/65 (!) 122/59 128/75  Pulse: 98 95 92 96  Resp: (!) 22  (!) 21 (!) 21  Temp: 98.4 F (36.9 C) 98.7 F (37.1 C)  98.8 F (37.1 C)  TempSrc: Oral Axillary  Axillary  SpO2: 100% 93% 94% 94%  Weight:      Height:        Physical Exam HENT:     Mouth/Throat:     Mouth: Mucous membranes are moist.  Eyes:     Pupils: Pupils are equal, round, and reactive to light.  Cardiovascular:     Rate and Rhythm: Normal rate and regular rhythm.     Pulses: Normal pulses.     Heart sounds: Normal heart sounds.  Pulmonary:     Effort: Pulmonary effort is normal.     Breath sounds: Normal breath sounds.  Abdominal:     General: Bowel sounds are normal.  Musculoskeletal:        General: Normal range of motion.     Cervical back: Neck supple.  Skin:    General: Skin is warm.     Capillary Refill: Capillary refill takes less than 2 seconds.  Neurological:     Mental Status: She is alert. She is disoriented.  Psychiatric:     Comments: Unable to  assess      Labs on Admission: I have personally reviewed following labs and imaging studies  CBC: Recent Labs  Lab 02/23/23 1859 02/23/23 1932 02/24/23 0154  WBC 8.3  --  9.1  NEUTROABS 6.2  --   --   HGB 12.5 12.6 12.9  HCT 37.2 37.0 41.3  MCV 86.1  --  87.7  PLT 162  --  138*   Basic Metabolic Panel: Recent Labs  Lab 02/23/23 1859 02/23/23 1932 02/24/23 0154  NA 132* 135 134*  K 3.5 3.6 3.4*  CL 97* 99 98  CO2 24  --  19*  GLUCOSE 129* 124* 181*  BUN 14 15 15   CREATININE 1.52* 1.60* 1.50*  CALCIUM 9.2  --  9.4  MG 1.8  --   --   PHOS 2.4*  --   --    GFR: Estimated Creatinine Clearance: 38.4 mL/min (A) (by C-G formula based on SCr of 1.5 mg/dL (H)). Liver Function Tests: Recent Labs  Lab 02/23/23 1859 02/24/23 0154  AST 16 17  ALT 14 14  ALKPHOS 34* 35*  BILITOT 1.0 1.1  PROT 7.0 7.2  ALBUMIN 3.5 3.6   No results for input(s): "LIPASE", "AMYLASE" in the last 168 hours. No results for input(s): "AMMONIA" in the last 168 hours. Coagulation Profile: Recent Labs  Lab 02/23/23 1859  INR 1.0   Cardiac Enzymes: Recent Labs  Lab 02/23/23 1859  CKTOTAL 102   BNP (last 3 results) No results for input(s): "BNP" in the last 8760 hours. HbA1C: Recent Labs    02/23/23 1859  HGBA1C 7.1*   CBG: Recent Labs  Lab 02/23/23 2257  GLUCAP 184*   Lipid Profile: No results for input(s): "CHOL", "HDL", "LDLCALC", "TRIG", "CHOLHDL", "LDLDIRECT" in the last 72 hours. Thyroid Function Tests: No results for input(s): "TSH", "T4TOTAL", "FREET4", "T3FREE", "THYROIDAB" in the last 72 hours. Anemia Panel: No results for input(s): "VITAMINB12", "FOLATE", "FERRITIN", "TIBC", "IRON", "RETICCTPCT" in the last 72 hours. Urine analysis:    Component Value  Date/Time   COLORURINE YELLOW 02/23/2023 2036   APPEARANCEUR CLEAR 02/23/2023 2036   LABSPEC 1.039 (H) 02/23/2023 2036   PHURINE 6.0 02/23/2023 2036   GLUCOSEU NEGATIVE 02/23/2023 2036   HGBUR SMALL (A)  02/23/2023 2036   BILIRUBINUR NEGATIVE 02/23/2023 2036   BILIRUBINUR neg 01/30/2014 1312   KETONESUR NEGATIVE 02/23/2023 2036   PROTEINUR NEGATIVE 02/23/2023 2036   UROBILINOGEN 1.0 01/30/2014 1312   NITRITE NEGATIVE 02/23/2023 2036   LEUKOCYTESUR SMALL (A) 02/23/2023 2036    Radiological Exams on Admission: I have personally reviewed images DG Pelvis Portable  Result Date: 02/23/2023 CLINICAL DATA:  Weakness EXAM: PORTABLE PELVIS 1-2 VIEWS COMPARISON:  CT abdomen and pelvis 03/29/2021 FINDINGS: Thin band of sclerosis about the right femoral head neck junction is favored projectional. If there is concern for acute fracture, CT is recommended. No dislocation. Degenerative changes pubic symphysis, both hips, SI joints and lower lumbar spine. Contrast within the bladder. IMPRESSION: Thin band of sclerosis about the right femoral head neck junction is favored projectional. If there is concern for acute fracture, CT is recommended. Electronically Signed   By: Minerva Fester M.D.   On: 02/23/2023 22:24   DG Chest Port 1 View  Result Date: 02/23/2023 CLINICAL DATA:  Weakness. EXAM: PORTABLE CHEST 1 VIEW COMPARISON:  Chest radiograph dated 01/02/2021. FINDINGS: No focal consolidation, pleural effusion, or pneumothorax. The cardiac silhouette is within normal limits. No acute osseous pathology. IMPRESSION: No active disease. Electronically Signed   By: Elgie Collard M.D.   On: 02/23/2023 20:57   CT ANGIO HEAD NECK W WO CM (CODE STROKE)  Result Date: 02/23/2023 CLINICAL DATA:  Left facial droop EXAM: CT ANGIOGRAPHY HEAD AND NECK WITH AND WITHOUT CONTRAST TECHNIQUE: Multidetector CT imaging of the head and neck was performed using the standard protocol during bolus administration of intravenous contrast. Multiplanar CT image reconstructions and MIPs were obtained to evaluate the vascular anatomy. Carotid stenosis measurements (when applicable) are obtained utilizing NASCET criteria, using the distal  internal carotid diameter as the denominator. RADIATION DOSE REDUCTION: This exam was performed according to the departmental dose-optimization program which includes automated exposure control, adjustment of the mA and/or kV according to patient size and/or use of iterative reconstruction technique. CONTRAST:  75mL OMNIPAQUE IOHEXOL 350 MG/ML SOLN COMPARISON:  None Available. FINDINGS: CTA NECK FINDINGS SKELETON: There is no bony spinal canal stenosis. No lytic or blastic lesion. OTHER NECK: Multiple bilateral thyroid nodules measuring up to 1.5 cm. 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 AORTIC ARCH: There is calcific atherosclerosis of the aortic arch. Conventional 3 vessel aortic branching pattern. RIGHT CAROTID SYSTEM: Normal without aneurysm, dissection or stenosis. LEFT CAROTID SYSTEM: Normal without aneurysm, dissection or stenosis. VERTEBRAL ARTERIES: Left dominant configuration.There is no dissection, occlusion or flow-limiting stenosis to the skull base (V1-V3 segments). CTA HEAD FINDINGS POSTERIOR CIRCULATION: --Vertebral arteries: Normal V4 segments. --Inferior cerebellar arteries: Normal. --Basilar artery: Normal. --Superior cerebellar arteries: Normal. --Posterior cerebral arteries (PCA): Normal. ANTERIOR CIRCULATION: --Intracranial internal carotid arteries: Atherosclerotic calcification of the internal carotid arteries at the skull base without hemodynamically significant stenosis. --Anterior cerebral arteries (ACA): Normal. Both A1 segments are present. Patent anterior communicating artery (a-comm). --Middle cerebral arteries (MCA): Normal. VENOUS SINUSES: As permitted by contrast timing, patent. ANATOMIC VARIANTS: None Review of the MIP images confirms the above findings. IMPRESSION: 1. No emergent large vessel occlusion or hemodynamically significant stenosis of the head or neck. 2. 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 3. Incidental thyroid nodule measuring 1.5 cm. Recommend non-emergent thyroid ultrasound. Reference: J Am Coll Radiol. 2015 Feb;12(2): 143-50 Aortic Atherosclerosis (ICD10-I70.0). Electronically Signed   By: Deatra Robinson M.D.   On: 02/23/2023 19:31   CT HEAD CODE STROKE WO CONTRAST  Result Date: 02/23/2023 CLINICAL DATA:  Code stroke. Acute neurologic deficit. Left facial droop EXAM: CT HEAD WITHOUT CONTRAST TECHNIQUE: Contiguous axial images were obtained from the base of the skull through the vertex without intravenous contrast. RADIATION DOSE REDUCTION: This exam was performed according to the departmental dose-optimization program which includes automated exposure control, adjustment of the mA and/or kV according to patient size and/or use of iterative reconstruction technique. COMPARISON:  None Available. FINDINGS: Brain: There is no mass, hemorrhage or extra-axial collection. The size and configuration of the ventricles and extra-axial CSF spaces are normal. The brain parenchyma is normal, without evidence of acute or chronic infarction. Vascular: No abnormal hyperdensity of the major intracranial arteries or dural venous sinuses. No intracranial atherosclerosis. Skull: The visualized skull base, calvarium and extracranial soft tissues are normal. Sinuses/Orbits: No fluid levels or advanced mucosal thickening of the visualized paranasal sinuses. No mastoid or middle ear effusion. The orbits are normal. ASPECTS [facility] Stroke Program Early CT Score) - Ganglionic level infarction (caudate, lentiform nuclei, internal capsule, insula, M1-M3 cortex): 7 - Supraganglionic infarction (M4-M6 cortex): 3 Total score (0-10 with 10 being normal): 10 IMPRESSION: 1. No acute intracranial abnormality. 2. ASPECTS is 10. These results were communicated to Dr. Erick Blinks at 7:23 pm on 02/23/2023 by text page via the [facility] messaging system. Electronically Signed   By: Deatra Robinson M.D.   On: 02/23/2023  19:23    EKG: My personal interpretation of EKG shows: Sinus tachycardia without any ST and T wave abnormality.    Assessment/Plan: Principal Problem:   New onset seizure with abnormal neurological exam without head trauma (HCC) Active Problems:   Acute metabolic encephalopathy   Mechanical fall-fall from bed   CKD stage 3b, GFR 30-44 ml/min (HCC)   GERD (gastroesophageal reflux disease)   Essential hypertension   DM (diabetes mellitus) type II controlled with renal manifestation (HCC)   Thyroid nodule    Assessment and Plan:   New onset of seizure Postictal phase/confusion Acute metabolic encephalopathy-secondary from underlying seizure -Patient last known well at 7 PM 02/22/2023.  Initial presentation patient has expressive aphasia and with the finding code stroke called.  CT head ruled out any acute intracranial abnormality.  CTA head and neck ruled out any vessel occlusion and stenosis. -Given stroke workup is negative there is concern for postictal state from underlying seizure or seizure-like activity. - Neurology  DR. Khaliqdina evaluated patient and initiated seizure workup. Per neurology recommendation patient got total 4000 mg of IV Keppra load, recommended routine EEG overnight and MRI of the brain without contrast.  Based on the EEG finding will discuss and plan further course of treatment.  Appreciate neurology evaluation. -Patient is euglycemic and slightly low sodium 132 which is not the cause of her seizure. -Neurology ordered routine EEG.   -Admitting to progressive care unit. - Continue Ativan 4 mg as needed for seizure control - Continue fall precaution, aspiration precaution and keep the head of the bed rise 30 degree angle - Continue telemonitoring overnight for any evidence of arrhythmia. -Currently keeping patient NPO. - Ordered  bedside swallow screening and consulted with SLP for evaluation. -Continue LR 75 cc/h until speech and swallow eval outpatient and  clear patient for  to safe to diet.  Mechanical fall Mechanical fall likely secondary from underlying seizure like activity. -Initial CT head negative.  CTA head negative. - X-ray pelvis no acute concern of fracture or dislocation. -CT unremarkable. -Continue fall precaution - Consulted with PT and OT for evaluation.   Incidental finding of thyroid nodule -CTA head and neck showed 1.5 cm thyroid nodule in syncytial finding.  Checking TSH and free T4 level - Patient to follow-up outpatient endocrinology on discharge.  Euvolemic mild hyponatremia - Slightly low sodium 132. - Continue to monitor sodium level.  If in the morning lab sodium level further decreasing change IV fluid LR to NS 75 cc per hours until oral diet resumes.   Essential hypertension -Patient has history of hypertension.  At home home blood pressure regimen include hydrochloric thiazide 12.5 mg and Toprol-XL 50 mg daily. - Holding blood pressure regimen as patient is NPO. - Most recent heart rate in between 82-96.   -Continue hydralazine 10 mg every 6 hours as needed for systolic blood pressure more than 160.  CKD stage 3b -Presentation creatinine 1.6 which is around patient baseline - Baseline GFR in between 32 to35 -Continue to monitor urine output - Avoid nephrotoxic agents when possible. - UA showed few rare bacteria leukocyte esterase.  Patient is afebrile and does not have any leukocytosis.  No concern for UTI at this time.   Diabetes mellitus type 2 - A1c 7.1. - At home patient is on Novolin 30-72 units twice daily. -As patient is n.p.o. holding insulin regimen as of now.  Once diet resumed can initiate home insulin regimen.  History of GERD - At home patient is on Protonix 40 mg daily.  Holding oral medication.  Also patient is n.p.o. no need for iv Protonix to start at this time.   DVT prophylaxis: SCDs.  Holding pharmacological prophylaxis until MRI rule out any acute intracranial abnormality. Code  Status: Full code.  Discussed with patient's daughter-in-law at the bedside. Diet: Currently n.p.o. Family Communication: Patient's sister and daughter-in-law at the bedside. Disposition Plan: Tentative discharge to home in 2 to 3 days. Consults: Neurology Admission status: Inpatient, Step Down Unit  Severity of Illness: The appropriate patient status for this patient is INPATIENT. Inpatient status is judged to be reasonable and necessary in order to provide the required intensity of service to ensure the patient's safety. The patient's presenting symptoms, physical exam findings, and initial radiographic and laboratory data in the context of their chronic comorbidities is felt to place them at high risk for further clinical deterioration. Furthermore, it is not anticipated that the patient will be medically stable for discharge from the hospital within 2 midnights of admission.   * I certify that at the point of admission it is my clinical judgment that the patient will require inpatient hospital care spanning beyond 2 midnights from the point of admission due to high intensity of service, high risk for further deterioration and high frequency of surveillance required.Marland Kitchen    Tereasa Coop MD [facility]  How to contact the [facility] Attending or Consulting provider 7A - 7P or covering provider during after hours 7P -7A, for this patient?   Check the care team in [facility] and look for a) attending/consulting [facility] provider listed and b) the [facility] team listed Log into [email or web address] and use [facility] universal password to access. If you do not have the password, please contact the hospital operator. Locate the [facility] provider you are looking for under [facility] and page  to a number that you can be directly reached. If you still have difficulty reaching the provider, please page the [facility] (Director on Call) for the Hospitalists listed on amion for assistance.  02/24/2023, 3:30 AM

## 2023-02-23 NOTE — Progress Notes (Signed)
Patient not available at the moment for LTM hook up. Patient will be moving to inpatient room per er nurse, EEG tech will reach out to inpatient nurse for EEG hook up.

## 2023-02-23 NOTE — ED Provider Notes (Signed)
[facility] AT [facility] Provider Note   CSN: [id number] Arrival date & time: 02/23/23  1827     History  Chief Complaint  Patient presents with   Fall   Weakness    Janice Payne is a 77 y.o. female.  Patient fell last night and has weakness in her legs and also some slurred speech.  Patient has a history of diabetes and hypertension.  The history is provided by a relative and the EMS personnel. No language interpreter was used.  Fall This is a new problem. The current episode started 6 to 12 hours ago. The problem occurs rarely. The problem has been resolved. Pertinent negatives include no chest pain. Nothing aggravates the symptoms. Nothing relieves the symptoms. She has tried nothing for the symptoms.  Weakness Associated symptoms: no chest pain        Home Medications Prior to Admission medications   Medication Sig Start Date End Date Taking? Authorizing Provider  acetaminophen (TYLENOL) 325 MG tablet Take 650 mg by mouth every 6 (six) hours as needed for mild pain, fever or headache.    [provider]  ALPRAZolam Prudy Feeler) 0.5 MG tablet Take 0.5 mg by mouth at bedtime as needed for sleep.    [provider]  Blood Glucose Monitoring Suppl (ONE TOUCH ULTRA 2) w/Device KIT Use to check blood sugar as directed 06/18/20   [provider]  cetirizine (ZYRTEC) 10 MG tablet Take 10 mg by mouth at bedtime.    [provider]  Cholecalciferol (VITAMIN D) 2000 UNITS tablet Take 2,000 Units by mouth daily.    [provider]  clotrimazole-betamethasone (LOTRISONE) cream Apply 1 application topically 2 (two) times daily as needed (rash/irritation).    [provider]  fenofibrate 160 MG tablet Take 160 mg by mouth daily before breakfast.    [provider]  gabapentin (NEURONTIN) 300 MG capsule Take 300 mg by mouth 2 (two) times daily. 11/05/20   [provider]  glucagon 1 MG  injection Follow package directions for low blood sugar. 03/15/17   [provider]  Glucose Blood (BLOOD GLUCOSE TEST STRIPS) STRP Test blood sugars twice daily. Dispense strips for Onetouch Ultra 2 06/18/20   [provider]  hydrochlorothiazide (HYDRODIURIL) 12.5 MG tablet Take 12.5 mg by mouth daily. 04/29/17   [provider]  hydrocortisone (ANUSOL-HC) 25 MG suppository Place 1 suppository (25 mg total) rectally 2 (two) times daily. 03/31/21   Drema Dallas, MD  insulin NPH-regular Human (NOVOLIN 70/30) (70-30) 100 UNIT/ML injection Inject 30-72 Units into the skin 2 (two) times daily with a meal. Inject 72 units in the morning and 30 units at bedtime 02/22/17   [provider]  Insulin Pen Needle 31G X 6 MM MISC See admin instructions. 11/03/16   [provider]  methocarbamol (ROBAXIN) 500 MG tablet Take 1 tablet (500 mg total) by mouth every 8 (eight) hours as needed for muscle spasms. 03/22/21   Andria Meuse, MD  metoprolol succinate (TOPROL-XL) 50 MG 24 hr tablet Take 50 mg by mouth daily before breakfast. Take with or immediately following a meal.    [provider]  oxyCODONE (OXY IR/ROXICODONE) 5 MG immediate release tablet Take 1-2 tablets (5-10 mg total) by mouth every 4 (four) hours as needed. 03/22/21   Andria Meuse, MD  pantoprazole (PROTONIX) 40 MG tablet Take 2 tablets (80 mg total) by mouth daily. 04/01/21   Drema Dallas, MD  pravastatin (PRAVACHOL) 20 MG tablet Take 20 mg by mouth at bedtime.     [provider]  sertraline (ZOLOFT) 50 MG tablet Take 50 mg by mouth daily. 11/05/20   [provider]      Allergies    Niacin, Codeine, Niaspan [niacin er], and Penicillins    Review of Systems   Review of Systems  Unable to perform ROS: Mental status change  Cardiovascular:  Negative for chest pain.  Neurological:  Positive for weakness.    Physical Exam Updated Vital Signs BP (!) 146/70 (BP  Location: Left Arm)   Pulse 82   Temp 98.4 F (36.9 C) (Oral)   Resp 15   Ht 5\' 6"  (1.676 m)   Wt 107.5 kg   SpO2 100%   BMI 38.25 kg/m  Physical Exam Constitutional:      Appearance: Normal appearance. She is well-developed.  HENT:     Head: Normocephalic.     Nose: Nose normal.  Eyes:     General: No scleral icterus.    Conjunctiva/sclera: Conjunctivae normal.  Neck:     Thyroid: No thyromegaly.  Cardiovascular:     Rate and Rhythm: Normal rate and regular rhythm.     Heart sounds: No murmur heard.    No friction rub. No gallop.  Pulmonary:     Breath sounds: No stridor. No wheezing or rales.  Chest:     Chest wall: No tenderness.  Abdominal:     General: There is no distension.     Tenderness: There is no abdominal tenderness. There is no rebound.  Musculoskeletal:        General: Normal range of motion.     Cervical back: Neck supple.     Comments: Weakness and lower extremity  Lymphadenopathy:     Cervical: No cervical adenopathy.  Skin:    Findings: No erythema or rash.  Neurological:     Mental Status: She is alert.     Motor: No abnormal muscle tone.     Coordination: Coordination normal.     Comments: Patient unable to get the words out to answer questions  Psychiatric:        Behavior: Behavior normal.     ED Results / Procedures / Treatments   Labs (all labs ordered are listed, but only abnormal results are displayed) Labs Reviewed  DIFFERENTIAL - Abnormal; Notable for the following components:      Result Value   Abs Immature Granulocytes 0.10 (*)    All other components within normal limits  COMPREHENSIVE METABOLIC PANEL - Abnormal; Notable for the following components:   Sodium 132 (*)    Chloride 97 (*)    Glucose, Bld 129 (*)    Creatinine, Ser 1.52 (*)    Alkaline Phosphatase 34 (*)    GFR, Estimated 35 (*)    All other components within normal limits  URINALYSIS, ROUTINE W REFLEX MICROSCOPIC - Abnormal; Notable for the following  components:   Specific Gravity, Urine 1.039 (*)    Hgb urine dipstick SMALL (*)    Leukocytes,Ua SMALL (*)    Bacteria, UA RARE (*)    All other components within normal limits  I-STAT CHEM 8, ED - Abnormal; Notable for the following components:   Creatinine, Ser 1.60 (*)    Glucose, Bld 124 (*)    All other components within normal limits  ETHANOL  PROTIME-INR  APTT  CBC  RAPID URINE DRUG SCREEN, HOSP PERFORMED    EKG None  Radiology DG Chest Port 1 View  Result Date: 02/23/2023 CLINICAL DATA:  Weakness. EXAM: PORTABLE CHEST 1 VIEW COMPARISON:  Chest radiograph dated 01/02/2021. FINDINGS: No focal consolidation, pleural effusion, or pneumothorax. The cardiac silhouette is within normal limits. No acute osseous pathology. IMPRESSION: No active disease. Electronically Signed   By: Elgie Collard M.D.   On: 02/23/2023 20:57   CT ANGIO HEAD NECK W WO CM (CODE STROKE)  Result Date: 02/23/2023 CLINICAL DATA:  Left facial droop EXAM: CT ANGIOGRAPHY HEAD AND NECK WITH AND WITHOUT CONTRAST TECHNIQUE: Multidetector CT imaging of the head and neck was performed using the standard protocol during bolus administration of intravenous contrast. Multiplanar CT image reconstructions and MIPs were obtained to evaluate the vascular anatomy. Carotid stenosis measurements (when applicable) are obtained utilizing NASCET criteria, using the distal internal carotid diameter as the denominator. RADIATION DOSE REDUCTION: This exam was performed according to the departmental dose-optimization program which includes automated exposure control, adjustment of the mA and/or kV according to patient size and/or use of iterative reconstruction technique. CONTRAST:  75mL OMNIPAQUE IOHEXOL 350 MG/ML SOLN COMPARISON:  None Available. FINDINGS: CTA NECK FINDINGS SKELETON: There is no bony spinal canal stenosis. No lytic or blastic lesion. OTHER NECK: Multiple bilateral thyroid nodules measuring up to 1.5 cm. 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 AORTIC ARCH: There is calcific atherosclerosis of the aortic arch. Conventional 3 vessel aortic branching pattern. RIGHT CAROTID SYSTEM: Normal without aneurysm, dissection or stenosis. LEFT CAROTID SYSTEM: Normal without aneurysm, dissection or stenosis. VERTEBRAL ARTERIES: Left dominant configuration.There is no dissection, occlusion or flow-limiting stenosis to the skull base (V1-V3 segments). CTA HEAD FINDINGS POSTERIOR CIRCULATION: --Vertebral arteries: Normal V4 segments. --Inferior cerebellar arteries: Normal. --Basilar artery: Normal. --Superior cerebellar arteries: Normal. --Posterior cerebral arteries (PCA): Normal. ANTERIOR CIRCULATION: --Intracranial internal carotid arteries: Atherosclerotic calcification of the internal carotid arteries at the skull base without hemodynamically significant stenosis. --Anterior cerebral arteries (ACA): Normal. Both A1 segments are present. Patent anterior communicating artery (a-comm). --Middle cerebral arteries (MCA): Normal. VENOUS SINUSES: As permitted by contrast timing, patent. ANATOMIC VARIANTS: None Review of the MIP images confirms the above findings. IMPRESSION: 1. No emergent large vessel occlusion or hemodynamically significant stenosis of the head or neck. 2. 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 3. Incidental thyroid nodule measuring 1.5 cm. Recommend non-emergent thyroid ultrasound. Reference: J Am Coll Radiol. 2015 Feb;12(2): 143-50 Aortic Atherosclerosis (ICD10-I70.0). Electronically Signed   By: Deatra Robinson M.D.   On: 02/23/2023 19:31   CT HEAD CODE STROKE WO CONTRAST  Result Date: 02/23/2023 CLINICAL DATA:  Code stroke. Acute neurologic deficit. Left facial droop EXAM: CT HEAD WITHOUT CONTRAST TECHNIQUE: Contiguous axial images were obtained from the base of the skull through the vertex without intravenous contrast. RADIATION DOSE  REDUCTION: This exam was performed according to the departmental dose-optimization program which includes automated exposure control, adjustment of the mA and/or kV according to patient size and/or use of iterative reconstruction technique. COMPARISON:  None Available. FINDINGS: Brain: There is no mass, hemorrhage or extra-axial collection. The size and configuration of the ventricles and extra-axial CSF spaces are normal. The brain parenchyma is normal, without evidence of acute or chronic infarction. Vascular: No abnormal hyperdensity of the major intracranial arteries or dural venous sinuses. No intracranial atherosclerosis. Skull: The visualized skull base, calvarium and extracranial soft tissues are normal. Sinuses/Orbits: No fluid levels or advanced mucosal thickening of the visualized paranasal sinuses. No mastoid or  middle ear effusion. The orbits are normal. ASPECTS [facility] Stroke Program Early CT Score) - Ganglionic level infarction (caudate, lentiform nuclei, internal capsule, insula, M1-M3 cortex): 7 - Supraganglionic infarction (M4-M6 cortex): 3 Total score (0-10 with 10 being normal): 10 IMPRESSION: 1. No acute intracranial abnormality. 2. ASPECTS is 10. These results were communicated to Dr. Erick Blinks at 7:23 pm on 02/23/2023 by text page via the [facility] messaging system. Electronically Signed   By: Deatra Robinson M.D.   On: 02/23/2023 19:23    Procedures Procedures    Medications Ordered in ED Medications  levETIRAcetam (KEPPRA) 2,000 mg in sodium chloride 0.9 % 250 mL IVPB (has no administration in time range)  iohexol (OMNIPAQUE) 350 MG/ML injection 75 mL (75 mLs Intravenous Contrast Given 02/23/23 1919)    ED Course/ Medical Decision Making/ A&P CRITICAL CARE Performed by: Bethann Berkshire Total critical care time: 45 minutes Critical care time was exclusive of separately billable procedures and treating other patients. Critical care was necessary to treat or prevent imminent or  life-threatening deterioration. Critical care was time spent personally by me on the following activities: development of treatment plan with patient and/or surrogate as well as nursing, discussions with consultants, evaluation of patient's response to treatment, examination of patient, obtaining history from patient or surrogate, ordering and performing treatments and interventions, ordering and review of laboratory studies, ordering and review of radiographic studies, pulse oximetry and re-evaluation of patient's condition.  Code stroke was called on the patient because it was thought it has been 20 hours since her symptoms but it had been 24 hours.  She was having difficulty with speech and weakness in her legs.  Patient was seen by neurology.  Later in the evening the patient had a seizure and neurology was contacted and they started her on antiepileptics.  Patient will be admitted to medicine and get an MRI with neurology consulting Click here for ABCD2, HEART and other calculatorsREFRESH Note before signing :1}                          Medical Decision Making Amount and/or Complexity of Data Reviewed Labs: ordered. Radiology: ordered.  Risk Decision regarding hospitalization.  This patient presents to the ED for concern of fall, this involves an extensive number of treatment options, and is a complaint that carries with it a high risk of complications and morbidity.  The differential diagnosis includes stroke, hypotension   Co morbidities that complicate the patient evaluation  Diabetes hypertension   Additional history obtained:  Additional history obtained from family and EMS External records from outside source obtained and reviewed including hospital record   Lab Tests:  I Ordered, and personally interpreted labs.  The pertinent results include: Urinalysis suggest UTI, creatinine 1.6   Imaging Studies ordered:  I ordered imaging studies including CT head I independently  visualized and interpreted imaging which showed no acute stroke I agree with the radiologist interpretation   Cardiac Monitoring: / EKG:  The patient was maintained on a cardiac monitor.  I personally viewed and interpreted the cardiac monitored which showed an underlying rhythm of: Normal sinus rhythm   Consultations Obtained:  I requested consultation with the neurology and hospitalist,  and discussed lab and imaging findings as well as pertinent plan - they recommend: Admit to hospitalist with neurology consult   Problem List / ED Course / Critical interventions / Medication management  Weakness and difficulty speaking I ordered medication including Keppra for  seizures Reevaluation of the patient after these medicines showed that the patient improved I have reviewed the patients home medicines and have made adjustments as needed   Social Determinants of Health:  None   Test / Admission - Considered:  None  Weakness in the lower extremity and difficulty getting words out.  Possible stroke.  CT scan negative but MRI pending.  Also new onset seizure        Final Clinical Impression(s) / ED Diagnoses Final diagnoses:  None    Rx / DC Orders ED Discharge Orders     None         Bethann Berkshire, MD 02/25/23 1433

## 2023-02-23 NOTE — ED Triage Notes (Addendum)
BIB EMS from home [facility] 11pm last night. Fell this morning at 3am trying to go to the bathroom and was on the ground for approx 2 hours until son woke up at 5am  and found her on the floor and called EMS at 5am who came out and assisted off floor back into bed. PT declined coming to ER at that time. Throughout the day has become more weak and unable to get words to come out. Pt did not hit head and is not on any blood thinners. Pt states she landed on butt but was unable to get up on her own. Denies any pain  144/82 80 96% 97.8 CBG 144

## 2023-02-23 NOTE — ED Notes (Signed)
ED TO INPATIENT HANDOFF REPORT  ED Nurse Name and Phone #: Terrea Bruster [phone]  S Name/Age/Gender Janice Payne 77 y.o. female Room/Bed: 012C/012C  Code Status   Code Status: Prior  Home/SNF/Other Home Patient oriented to: self Is this baseline? No   Triage Complete: Triage complete  Chief Complaint Seizure [facility]) [R56.9]  Triage Note BIB EMS from home [place] 11pm last night. Fell this morning at 3am trying to go to the bathroom and was on the ground for approx 2 hours until son woke up at 5am  and found her on the floor and called EMS at 5am who came out and assisted off floor back into bed. PT declined coming to ER at that time. Throughout the day has become more weak and unable to get words to come out. Pt did not hit head and is not on any blood thinners. Pt states she landed on butt but was unable to get up on her own. Denies any pain  144/82 80 96% 97.8 CBG 144   Allergies Allergies  Allergen Reactions   Niacin Itching and Rash   Codeine Nausea And Vomiting and Nausea Only   Niaspan [Niacin Er] Nausea And Vomiting   Penicillins Rash    Level of Care/Admitting Diagnosis ED Disposition     ED Disposition  Admit   Condition  --   Comment  Hospital Area: [facility] [[id number]]  Level of Care: Progressive [102]  Admit to Progressive based on following criteria: NEUROLOGICAL AND NEUROSURGICAL complex patients with significant risk of instability, who do not meet ICU criteria, yet require close observation or frequent assessment (< / = every 2 - 4 hours) with medical / nursing intervention.  May admit patient to [facility] or [facility] if equivalent level of care is available:: No  Covid Evaluation: Asymptomatic - no recent exposure (last 10 days) testing not required  Diagnosis: Seizure [facility]) [[id number]]  Admitting Physician: Tereasa Coop [[id number]]  Attending Physician: Tereasa Coop [[id number]]  Certification:: I certify this patient will need inpatient  services for at least 2 midnights  Estimated Length of Stay: 3          B Medical/Surgery History Past Medical History:  Diagnosis Date   Arthritis    left hip, neck   Dental crown present    Difficult airway    Dry mouth    GERD (gastroesophageal reflux disease)    History of asthma    no current problems or med.   History of kidney stones    Hyperlipidemia    Hypertension    under control with meds.   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 Cornett, MD;  Location: [facility];  Service: General;  Laterality: Right;   EXTRACORPOREAL SHOCK WAVE LITHOTRIPSY  11/27/2012   LAPAROTOMY N/A 03/16/2021   Procedure: Diagnostic Laparoscopy, Exploratory Laparotomy with colon repair;  Surgeon: Fritzi Mandes, MD;  Location: [facility] OR;  Service: General;  Laterality: N/A;   TUBAL LIGATION       A IV Location/Drains/Wounds Patient Lines/Drains/Airways Status     Active Line/Drains/Airways     Name Placement date Placement time Site Days   Peripheral IV 03/29/21 20 G 1" Right Antecubital 03/29/21  1729  Antecubital  696   Incision (Closed) 11/14/13 Breast Right 11/14/13  1354  -- 3388   Incision (Closed) 03/17/21 Abdomen Other (Comment) 03/17/21  0009  -- 708            Intake/Output Last 24 hours No intake or output data in the 24 hours ending 02/23/23 2119  Labs/Imaging Results for orders placed or performed during the hospital encounter of 02/23/23 (from the past 48 hour(s))  Ethanol     Status: None   Collection Time: 02/23/23  6:59 PM  Result Value Ref Range   Alcohol, Ethyl (B) <10 <10 mg/dL    Comment: (NOTE) Lowest detectable limit for serum alcohol is 10 mg/dL.  For medical purposes only. Performed at  [facility], [address], [place], [place] [address]   Protime-INR     Status: None   Collection Time: 02/23/23  6:59 PM  Result Value Ref Range   Prothrombin Time 13.5 11.4 - 15.2 seconds   INR 1.0 0.8 - 1.2    Comment: (NOTE) INR goal varies based on device and disease states. Performed at [facility], [address] [place], [place] [address]   APTT     Status: None   Collection Time: 02/23/23  6:59 PM  Result Value Ref Range   aPTT 25 24 - 36 seconds    Comment: Performed at [facility], [address] [place], [place] [address]  CBC     Status: None   Collection Time: 02/23/23  6:59 PM  Result Value Ref Range   WBC 8.3 4.0 - 10.5 K/uL   RBC 4.32 3.87 - 5.11 MIL/uL   Hemoglobin 12.5 12.0 - 15.0 g/dL   HCT 57.8 46.9 - 62.9 %   MCV 86.1 80.0 - 100.0 fL   MCH 28.9 26.0 - 34.0 pg   MCHC 33.6 30.0 - 36.0 g/dL   RDW 52.8 41.3 - 24.4 %   Platelets 162 150 - 400 K/uL   nRBC 0.0 0.0 - 0.2 %    Comment: Performed at [facility], [address] [place], [place] [address]  Differential     Status: Abnormal   Collection Time: 02/23/23  6:59 PM  Result Value Ref Range   Neutrophils Relative % 75 %   Neutro Abs 6.2 1.7 - 7.7 K/uL   Lymphocytes Relative 17 %   Lymphs Abs 1.4 0.7 - 4.0 K/uL   Monocytes Relative 6 %   Monocytes Absolute 0.5 0.1 - 1.0 K/uL   Eosinophils Relative 0 %   Eosinophils Absolute 0.0 0.0 - 0.5 K/uL   Basophils Relative 1 %   Basophils Absolute 0.0 0.0 - 0.1 K/uL   Immature Granulocytes 1 %   Abs Immature Granulocytes 0.10 (H) 0.00 - 0.07 K/uL    Comment: Performed at [facility], [address], [place], [place] [address]  Comprehensive metabolic panel     Status: Abnormal   Collection Time: 02/23/23  6:59 PM  Result Value Ref Range   Sodium 132 (L) 135 - 145 mmol/L   Potassium 3.5 3.5 - 5.1 mmol/L   Chloride 97 (L) 98 - 111 mmol/L   CO2 24 22 - 32 mmol/L   Glucose, Bld 129 (H) 70 - 99 mg/dL    Comment:  Glucose reference range applies only to samples taken after fasting for at least 8 hours.   BUN 14 8 - 23 mg/dL   Creatinine, Ser 4.40 (H)  0.44 - 1.00 mg/dL   Calcium 9.2 8.9 - 16.1 mg/dL   Total Protein 7.0 6.5 - 8.1 g/dL   Albumin 3.5 3.5 - 5.0 g/dL   AST 16 15 - 41 U/L   ALT 14 0 - 44 U/L   Alkaline Phosphatase 34 (L) 38 - 126 U/L   Total Bilirubin 1.0 0.3 - 1.2 mg/dL   GFR, Estimated 35 (L) >60 mL/min    Comment: (NOTE) Calculated using the CKD-EPI Creatinine Equation (2021)    Anion gap 11 5 - 15    Comment: Performed at [facility], [address] [place], [place] [address]  I-stat chem 8, ED     Status: Abnormal   Collection Time: 02/23/23  7:32 PM  Result Value Ref Range   Sodium 135 135 - 145 mmol/L   Potassium 3.6 3.5 - 5.1 mmol/L   Chloride 99 98 - 111 mmol/L   BUN 15 8 - 23 mg/dL   Creatinine, Ser 5.40 (H) 0.44 - 1.00 mg/dL   Glucose, Bld 981 (H) 70 - 99 mg/dL    Comment: Glucose reference range applies only to samples taken after fasting for at least 8 hours.   Calcium, Ion 1.18 1.15 - 1.40 mmol/L   TCO2 26 22 - 32 mmol/L   Hemoglobin 12.6 12.0 - 15.0 g/dL   HCT 19.1 47.8 - 29.5 %  Urinalysis, Routine w reflex microscopic -Urine, Clean Catch     Status: Abnormal   Collection Time: 02/23/23  8:36 PM  Result Value Ref Range   Color, Urine YELLOW YELLOW   APPearance CLEAR CLEAR   Specific Gravity, Urine 1.039 (H) 1.005 - 1.030   pH 6.0 5.0 - 8.0   Glucose, UA NEGATIVE NEGATIVE mg/dL   Hgb urine dipstick SMALL (A) NEGATIVE   Bilirubin Urine NEGATIVE NEGATIVE   Ketones, ur NEGATIVE NEGATIVE mg/dL   Protein, ur NEGATIVE NEGATIVE mg/dL   Nitrite NEGATIVE NEGATIVE   Leukocytes,Ua SMALL (A) NEGATIVE   RBC / HPF 0-5 0 - 5 RBC/hpf   WBC, UA 11-20 0 - 5 WBC/hpf   Bacteria, UA RARE (A) NONE SEEN   Squamous Epithelial / HPF 0-5 0 - 5 /HPF    Comment: Performed at [facility], [address], [place], [place] [address]   DG Chest Port 1 View  Result  Date: 02/23/2023 CLINICAL DATA:  Weakness. EXAM: PORTABLE CHEST 1 VIEW COMPARISON:  Chest radiograph dated 01/02/2021. FINDINGS: No focal consolidation, pleural effusion, or pneumothorax. The cardiac silhouette is within normal limits. No acute osseous pathology. IMPRESSION: No active disease. Electronically Signed   By: Elgie Collard M.D.   On: 02/23/2023 20:57   CT ANGIO HEAD NECK W WO CM (CODE STROKE)  Result Date: 02/23/2023 CLINICAL DATA:  Left facial droop EXAM: CT ANGIOGRAPHY HEAD AND NECK WITH AND WITHOUT CONTRAST TECHNIQUE: Multidetector CT imaging of the head and neck was performed using the standard protocol during bolus administration of intravenous contrast. Multiplanar CT image reconstructions and MIPs were obtained to evaluate the vascular anatomy. Carotid stenosis measurements (when applicable) are obtained utilizing NASCET criteria, using the distal internal carotid diameter as the denominator. RADIATION DOSE REDUCTION: This exam was performed according to the departmental dose-optimization program which includes automated exposure control, adjustment of the mA and/or kV according to patient size and/or use of iterative reconstruction technique. CONTRAST:  75mL OMNIPAQUE IOHEXOL 350 MG/ML SOLN COMPARISON:  None Available. FINDINGS: CTA NECK FINDINGS SKELETON: There is no bony spinal canal  stenosis. No lytic or blastic lesion. OTHER NECK: Multiple bilateral thyroid nodules measuring up to 1.5 cm. 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 AORTIC ARCH: There is calcific atherosclerosis of the aortic arch. Conventional 3 vessel aortic branching pattern. RIGHT CAROTID SYSTEM: Normal without aneurysm, dissection or stenosis. LEFT CAROTID SYSTEM: Normal without aneurysm, dissection or stenosis. VERTEBRAL ARTERIES: Left dominant configuration.There is no dissection, occlusion or flow-limiting stenosis to the skull base (V1-V3 segments). CTA HEAD  FINDINGS POSTERIOR CIRCULATION: --Vertebral arteries: Normal V4 segments. --Inferior cerebellar arteries: Normal. --Basilar artery: Normal. --Superior cerebellar arteries: Normal. --Posterior cerebral arteries (PCA): Normal. ANTERIOR CIRCULATION: --Intracranial internal carotid arteries: Atherosclerotic calcification of the internal carotid arteries at the skull base without hemodynamically significant stenosis. --Anterior cerebral arteries (ACA): Normal. Both A1 segments are present. Patent anterior communicating artery (a-comm). --Middle cerebral arteries (MCA): Normal. VENOUS SINUSES: As permitted by contrast timing, patent. ANATOMIC VARIANTS: None Review of the MIP images confirms the above findings. IMPRESSION: 1. No emergent large vessel occlusion or hemodynamically significant stenosis of the head or neck. 2. 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 3. Incidental thyroid nodule measuring 1.5 cm. Recommend non-emergent thyroid ultrasound. Reference: J Am Coll Radiol. 2015 Feb;12(2): 143-50 Aortic Atherosclerosis (ICD10-I70.0). Electronically Signed   By: Deatra Robinson M.D.   On: 02/23/2023 19:31   CT HEAD CODE STROKE WO CONTRAST  Result Date: 02/23/2023 CLINICAL DATA:  Code stroke. Acute neurologic deficit. Left facial droop EXAM: CT HEAD WITHOUT CONTRAST TECHNIQUE: Contiguous axial images were obtained from the base of the skull through the vertex without intravenous contrast. RADIATION DOSE REDUCTION: This exam was performed according to the departmental dose-optimization program which includes automated exposure control, adjustment of the mA and/or kV according to patient size and/or use of iterative reconstruction technique. COMPARISON:  None Available. FINDINGS: Brain: There is no mass, hemorrhage or extra-axial collection. The size and configuration of the ventricles and extra-axial CSF spaces are normal. The brain parenchyma is normal, without evidence of acute or chronic  infarction. Vascular: No abnormal hyperdensity of the major intracranial arteries or dural venous sinuses. No intracranial atherosclerosis. Skull: The visualized skull base, calvarium and extracranial soft tissues are normal. Sinuses/Orbits: No fluid levels or advanced mucosal thickening of the visualized paranasal sinuses. No mastoid or middle ear effusion. The orbits are normal. ASPECTS [facility] Stroke Program Early CT Score) - Ganglionic level infarction (caudate, lentiform nuclei, internal capsule, insula, M1-M3 cortex): 7 - Supraganglionic infarction (M4-M6 cortex): 3 Total score (0-10 with 10 being normal): 10 IMPRESSION: 1. No acute intracranial abnormality. 2. ASPECTS is 10. These results were communicated to Dr. Erick Blinks at 7:23 pm on 02/23/2023 by text page via the [facility] messaging system. Electronically Signed   By: Deatra Robinson M.D.   On: 02/23/2023 19:23    Pending Labs Unresulted Labs (From admission, onward)     Start     Ordered   02/23/23 1855  Urine rapid drug screen (hosp performed)  Once,   STAT        02/23/23 1854            Vitals/Pain Today's Vitals   02/23/23 1842 02/23/23 2037  BP: (!) 146/70   Pulse: 82   Resp: 15   Temp: 98.4 F (36.9 C)   TempSrc: Oral   SpO2: 100%   Weight: 107.5 kg   Height: 5\' 6"  (1.676 m)   PainSc: 0-No pain 0-No pain    Isolation Precautions No  active isolations  Medications Medications  levETIRAcetam (KEPPRA) 2,000 mg in sodium chloride 0.9 % 250 mL IVPB (has no administration in time range)  iohexol (OMNIPAQUE) 350 MG/ML injection 75 mL (75 mLs Intravenous Contrast Given 02/23/23 1919)    Mobility walks     Focused Assessments Neuro Assessment Handoff:  Swallow screen pass?            Neuro Assessment:   Neuro Checks:      Has TPA been given? No If patient is a Neuro Trauma and patient is going to OR before floor call report to 4N Charge nurse: [phone] or [phone]   R Recommendations: See  Admitting Provider Note  Report given to:   Additional Notes: unable to do swallow screen

## 2023-02-23 NOTE — Consult Note (Signed)
NEUROLOGY CONSULTATION NOTE   Date of service: February 23, 2023 Patient Name: Janice Payne MRN:  [id number] DOB:  [birth year] Reason for consult: "Aphasia, code stroke" Requesting Provider: Bethann Berkshire, MD _ _ _   _ __   _ __ _ _  __ __   _ __   __ _  History of Present Illness  Janice Payne is a 77 y.o. female with PMH significant for GERD, DM2, HTN, HLD who was last seen normal at dinner last night around 1900. In the middle of the night, fell next to bed and sat for 2 hours before calling for her son to help. Son and daughter in law could not pick her up and called EMS who helped her in the bed and vitals were okay. Family left in AM for work. Other son got to her house and she seemed very off. Did not know how to use her phone. Speech much more hesitant.  They called EMS again and she was brought in for further evaluation. In the ED, noted to be aphasic and a code stroke was activated with an initial LKW of 11PM. However, upon discussion with family and patient, LKW is 76.  LKW: 1900 mRS: 3 tNKASE: not offered, outside window Thrombectomy: not offered, no LVO NIHSS components Score: Comment  1a Level of Conscious 0[x]  1[]  2[]  3[]      1b LOC Questions 0[]  1[x]  2[]       1c LOC Commands 0[x]  1[]  2[]       2 Best Gaze 0[x]  1[]  2[]       3 Visual 0[x]  1[]  2[]  3[]      4 Facial Palsy 0[x]  1[]  2[]  3[]      5a Motor Arm - left 0[x]  1[]  2[]  3[]  4[]  UN[]    5b Motor Arm - Right 0[x]  1[]  2[]  3[]  4[]  UN[]    6a Motor Leg - Left 0[]  1[x]  2[]  3[]  4[]  UN[]    6b Motor Leg - Right 0[]  1[x]  2[]  3[]  4[]  UN[]    7 Limb Ataxia 0[x]  1[]  2[]  3[]  UN[]     8 Sensory 0[x]  1[]  2[]  UN[]      9 Best Language 0[]  1[x]  2[]  3[]      10 Dysarthria 0[]  1[x]  2[]  UN[]      11 Extinct. and Inattention 0[x]  1[]  2[]       TOTAL: 5         ROS   Constitutional Denies weight loss, fever and chills.   HEENT Denies changes in vision and hearing.   Respiratory Denies SOB and cough.   CV Denies palpitations and CP    GI Denies abdominal pain, nausea, vomiting and diarrhea.   GU Denies dysuria and urinary frequency.   MSK Denies myalgia and joint pain.   Skin Denies rash and pruritus.   Neurological Denies headache and syncope.   Psychiatric Denies recent changes in mood. Denies anxiety and depression.    Past History   Past Medical History:  Diagnosis Date   Arthritis    left hip, neck   Dental crown present    Difficult airway    Dry mouth    GERD (gastroesophageal reflux disease)    History of asthma    no current problems or med.   History of kidney stones    Hyperlipidemia    Hypertension    under control with meds.   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 Cornett, MD;  Location: [facility];  Service: General;  Laterality: Right;   EXTRACORPOREAL SHOCK WAVE LITHOTRIPSY  11/27/2012   LAPAROTOMY N/A 03/16/2021   Procedure: Diagnostic Laparoscopy, Exploratory Laparotomy with colon repair;  Surgeon: Fritzi Mandes, MD;  Location: [facility] OR;  Service: General;  Laterality: N/A;   TUBAL LIGATION     Family History  Problem Relation Age of Onset   Heart disease Father    Social History   Socioeconomic History   Marital status: Widowed    Spouse name: Not on file   Number of children: Not on file   Years of education: Not on file   Highest education level: Not on file  Occupational History   Not on file  Tobacco Use   Smoking status: Never   Smokeless tobacco: Never  Substance and Sexual Activity   Alcohol use: No   Drug use: No   Sexual activity: Not on file  Other Topics Concern   Not on file  Social History Narrative   Not on file   Social Determinants of Health   Financial Resource Strain: Not on file  Food  Insecurity: Not on file  Transportation Needs: Not on file  Physical Activity: Not on file  Stress: Not on file  Social Connections: Not on file   Allergies  Allergen Reactions   Niacin Itching and Rash   Codeine Nausea And Vomiting and Nausea Only   Niaspan [Niacin Er] Nausea And Vomiting   Penicillins Rash    Medications  (Not in a hospital admission)    Vitals   Vitals:   02/23/23 1842  BP: (!) 146/70  Pulse: 82  Resp: 15  Temp: 98.4 F (36.9 C)  TempSrc: Oral  SpO2: 100%  Weight: 107.5 kg  Height: 5\' 6"  (1.676 m)     Body mass index is 38.25 kg/m.  Physical Exam   General: Laying comfortably in bed; in no acute distress.  HENT: Normal oropharynx and mucosa. Normal external appearance of ears and nose.  Neck: Supple, no pain or tenderness  CV: No JVD. No peripheral edema.  Pulmonary: Symmetric Chest rise. Normal respiratory effort.  Abdomen: Soft to touch, non-tender.  Ext: No cyanosis, edema, or deformity  Skin: No rash. Normal palpation of skin.   Musculoskeletal: Normal digits and nails by inspection. No clubbing.   Neurologic Examination  Mental status/Cognition: Alert, oriented to self, place, but not to month and year, good attention.  Speech/language: dysarthric, non fluent, comprehension intact to simple commands only, object naming intact, repetition intact. Hesitant speech, taking frequent pauses and making paraphasic errors. Cranial nerves:   CN II Pupils equal and reactive to light, no VF deficits    CN III,IV,VI EOM intact, no gaze preference or deviation, no nystagmus    CN V normal sensation in V1, V2, and V3 segments bilaterally    CN VII no asymmetry, no nasolabial fold flattening    CN VIII normal hearing to speech    CN IX & X normal palatal elevation, no uvular deviation    CN XI 5/5 head turn and 5/5 shoulder shrug bilaterally    CN XII midline tongue protrusion    Motor:  Muscle bulk: normal, tone normal Mvmt Root Nerve  Muscle  Right Left Comments  SA C5/6 Ax Deltoid 5 5   EF C5/6 Mc Biceps  5 5   EE C6/7/8 Rad Triceps 5 5   WF C6/7 Med FCR     WE C7/8 PIN ECU     F Ab C8/T1 U ADM/FDI 5 5   HF L1/2/3 Fem Illopsoas 4+ 4+   KE L2/3/4 Fem Quad     DF L4/5 D Peron Tib Ant 5 5   PF S1/2 Tibial Grc/Sol 5 5    Sensation:  Light touch Grossly intact BL   Pin prick    Temperature    Vibration   Proprioception    Coordination/Complex Motor:  - Finger to Nose intact BL - Heel to shin unable to do due to body habitus. - Rapid alternating movement are slowed BL - Gait: deferred.  Labs   CBC:  Recent Labs  Lab 02/23/23 1859 02/23/23 1932  WBC 8.3  --   NEUTROABS 6.2  --   HGB 12.5 12.6  HCT 37.2 37.0  MCV 86.1  --   PLT 162  --     Basic Metabolic Panel:  Lab Results  Component Value Date   NA 135 02/23/2023   K 3.6 02/23/2023   CO2 24 02/23/2023   GLUCOSE 124 (H) 02/23/2023   BUN 15 02/23/2023   CREATININE 1.60 (H) 02/23/2023   CALCIUM 9.2 02/23/2023   GFRNONAA 35 (L) 02/23/2023   GFRAA 63 (L) 11/27/2012   Lipid Panel: No results found for: "LDLCALC" HgbA1c:  Lab Results  Component Value Date   HGBA1C 6.8 (H) 03/30/2021   Urine Drug Screen: No results found for: "LABOPIA", "COCAINSCRNUR", "LABBENZ", "AMPHETMU", "THCU", "LABBARB"  Alcohol Level     Component Value Date/Time   ETH <10 02/23/2023 1859    CT Head without contrast(Personally reviewed): CTH was negative for a large hypodensity concerning for a large territory infarct or hyperdensity concerning for an ICH  CT angio Head and Neck with contrast(Personally reviewed): No LVO  MRI Brain: pending  Impression   Janice Payne is a 77 y.o. female with PMH significant for GERD, DM2, HTN, HLD who presents with aphasia. Presentation is concerning for a stroke most likely specially given noted multiple stroke risk factors.  CT head negative and no LVO on CTA. She was not given tnkase 2/2 outside window. She was not offered  thrombectomy 2/2 no LVO  While in the ED, she had a GTC seizure and is post ictal now. Recommendations  - MRI Brain without contrast - routine EEG - Keppra load of 2000mg  IV once. Further Aeds based on EEG and MRI Brain.  ______________________________________________________________________   Thank you for the opportunity to take part in the care of this patient. If you have any further questions, please contact the neurology consultation attending.  Signed,  Erick Blinks [facility] _ _ _   _ __   _ __ _ _  __ __   _ __   __ _

## 2023-02-23 NOTE — Code Documentation (Signed)
Responded to Code Stroke called on pt already in the ED d/t fall with weakness and aphasia. Code Stroke call at 1859 for L sided facial droop and L sided weakness, LSN-1900 on 7/2. CBG-129, NIH-7, CT head negative for acute changes, CTA-no LVO. TNK not given-pt outside window for acute interventions. Plan to admit for stroke w/o. VS/neuro checks q2h x 12, then q4h.

## 2023-02-24 ENCOUNTER — Inpatient Hospital Stay (HOSPITAL_COMMUNITY): Payer: Medicare Other

## 2023-02-24 DIAGNOSIS — R2990 Unspecified symptoms and signs involving the nervous system: Secondary | ICD-10-CM

## 2023-02-24 DIAGNOSIS — G9341 Metabolic encephalopathy: Secondary | ICD-10-CM

## 2023-02-24 DIAGNOSIS — R569 Unspecified convulsions: Secondary | ICD-10-CM

## 2023-02-24 DIAGNOSIS — E041 Nontoxic single thyroid nodule: Secondary | ICD-10-CM

## 2023-02-24 LAB — GLUCOSE, CAPILLARY
Glucose-Capillary: 171 mg/dL — ABNORMAL HIGH (ref 70–99)
Glucose-Capillary: 134 mg/dL — ABNORMAL HIGH (ref 70–99)
Glucose-Capillary: 185 mg/dL — ABNORMAL HIGH (ref 70–99)
Glucose-Capillary: 187 mg/dL — ABNORMAL HIGH (ref 70–99)
Glucose-Capillary: 206 mg/dL — ABNORMAL HIGH (ref 70–99)
Glucose-Capillary: 187 mg/dL — ABNORMAL HIGH (ref 70–99)

## 2023-02-24 LAB — CBC
HCT: 41.3 % (ref 36.0–46.0)
Hemoglobin: 12.9 g/dL (ref 12.0–15.0)
MCH: 27.4 pg (ref 26.0–34.0)
MCHC: 31.2 g/dL (ref 30.0–36.0)
MCV: 87.7 fL (ref 80.0–100.0)
Platelets: 138 10*3/uL — ABNORMAL LOW (ref 150–400)
RBC: 4.71 MIL/uL (ref 3.87–5.11)
RDW: 12.4 % (ref 11.5–15.5)
WBC: 9.1 10*3/uL (ref 4.0–10.5)
nRBC: 0 % (ref 0.0–0.2)

## 2023-02-24 LAB — COMPREHENSIVE METABOLIC PANEL
ALT: 14 U/L (ref 0–44)
AST: 17 U/L (ref 15–41)
Albumin: 3.6 g/dL (ref 3.5–5.0)
Alkaline Phosphatase: 35 U/L — ABNORMAL LOW (ref 38–126)
Anion gap: 17 — ABNORMAL HIGH (ref 5–15)
BUN: 15 mg/dL (ref 8–23)
CO2: 19 mmol/L — ABNORMAL LOW (ref 22–32)
Calcium: 9.4 mg/dL (ref 8.9–10.3)
Chloride: 98 mmol/L (ref 98–111)
Creatinine, Ser: 1.5 mg/dL — ABNORMAL HIGH (ref 0.44–1.00)
GFR, Estimated: 35 mL/min — ABNORMAL LOW (ref >60)
Glucose, Bld: 181 mg/dL — ABNORMAL HIGH (ref 70–99)
Potassium: 3.4 mmol/L — ABNORMAL LOW (ref 3.5–5.1)
Sodium: 134 mmol/L — ABNORMAL LOW (ref 135–145)
Total Bilirubin: 1.1 mg/dL (ref 0.3–1.2)
Total Protein: 7.2 g/dL (ref 6.5–8.1)

## 2023-02-24 LAB — PROCALCITONIN: Procalcitonin: <0.1 ng/mL

## 2023-02-24 LAB — AMMONIA: Ammonia: 23 umol/L (ref 9–35)

## 2023-02-24 LAB — T4, FREE: Free T4: 1.19 ng/dL — ABNORMAL HIGH (ref 0.61–1.12)

## 2023-02-24 LAB — C-REACTIVE PROTEIN: CRP: 1.3 mg/dL — ABNORMAL HIGH (ref <1.0)

## 2023-02-24 LAB — TSH: TSH: 1.085 u[IU]/mL (ref 0.350–4.500)

## 2023-02-24 LAB — FOLATE: Folate: 11.1 ng/mL (ref >5.9)

## 2023-02-24 LAB — VITAMIN B12: Vitamin B-12: 179 pg/mL — ABNORMAL LOW (ref 180–914)

## 2023-02-24 MED ORDER — SODIUM CHLORIDE 0.9 % IV SOLN
1.0000 g | INTRAVENOUS | Status: DC
  Administered 2023-02-24 – 2023-02-25 (×2): 1 g via INTRAVENOUS
  Filled 2023-02-24 (×2): qty 10

## 2023-02-24 MED ORDER — LORAZEPAM 2 MG/ML IJ SOLN
INTRAMUSCULAR | Status: AC
  Administered 2023-02-24: 1 mg via INTRAVENOUS
  Filled 2023-02-24: qty 1

## 2023-02-24 MED ORDER — POTASSIUM CHLORIDE 10 MEQ/100ML IV SOLN
10.0000 meq | INTRAVENOUS | Status: AC
  Administered 2023-02-24 (×4): 10 meq via INTRAVENOUS
  Filled 2023-02-24 (×4): qty 100

## 2023-02-24 MED ORDER — DEXTROSE-NACL 5-0.45 % IV SOLN: INTRAVENOUS | Status: DC

## 2023-02-24 MED ORDER — LORAZEPAM 2 MG/ML IJ SOLN: 1.0000 mg | Freq: Once | INTRAMUSCULAR | Status: AC | PRN

## 2023-02-24 MED ORDER — HALOPERIDOL LACTATE 5 MG/ML IJ SOLN
1.0000 mg | Freq: Four times a day (QID) | INTRAMUSCULAR | Status: DC | PRN
  Administered 2023-02-24: 1 mg via INTRAVENOUS
  Filled 2023-02-24: qty 1

## 2023-02-24 MED ORDER — LORAZEPAM 2 MG/ML IJ SOLN: 1.0000 mg | Freq: Once | INTRAMUSCULAR | Status: DC | PRN

## 2023-02-24 MED ORDER — PANTOPRAZOLE SODIUM 40 MG IV SOLR
40.0000 mg | INTRAVENOUS | Status: DC
  Administered 2023-02-24 – 2023-02-25 (×2): 40 mg via INTRAVENOUS
  Filled 2023-02-24 (×2): qty 10

## 2023-02-24 MED ORDER — POTASSIUM CHLORIDE CRYS ER 20 MEQ PO TBCR: 40.0000 meq | EXTENDED_RELEASE_TABLET | Freq: Once | ORAL | Status: DC

## 2023-02-24 NOTE — Progress Notes (Signed)
Pt hooked up to EEG leads that are not MR Conditional. Unable to scan at this time. RN aware that leads have to removed prior to pt getting MR scan. Told him to let us know when they have been removed so we can proceed with study

## 2023-02-24 NOTE — Progress Notes (Signed)
SLP Cancellation Note  Patient Details Name: Janice Payne MRN: [id number] DOB: 03/30/1946   Cancelled treatment:        Second attempt at swallow assessment. Pt is significantly agitated, pulling off leads, constantly moving in and trying to get out of the bed. When straw placed to lips or spoon in mouth she bit both and was unable to accept. Will attempt again tomorrow.    Royce Macadamia 02/24/2023, 2:04 PM

## 2023-02-24 NOTE — Progress Notes (Signed)
LTM EEG discontinued - no skin breakdown at unhook. [facility] notified 

## 2023-02-24 NOTE — Progress Notes (Signed)
EEG complete - results pending 

## 2023-02-24 NOTE — Evaluation (Signed)
Occupational Therapy Evaluation Patient Details Name: Janice Payne MRN: [id number] DOB: 1946/05/26 Today's Date: 02/24/2023   History of Present Illness 77 y/o F admitted to [facility] on 7/3 with complaints of unable to speak fluently. In the ER, pt had a seizure and got progressively confused. PMHx: HTN, DM, HLD, GERD   Clinical Impression   Pt s/p above diagnosis. Pt PLOF independent with ADLs/IADLs, lives at home with daughter in law. Pt family present during session. Pt currently not able to follow commands, participate in ADLs due to decrease in cognition, not able to answer questions. Pt extremely restless trying to sit up, roll, reaching for bed rails repeatedly throughout session. After attempting to EOB Pt became lethargic and went to sleep, no longer restless.  Pt would benefit greatly from continued acute OT to maximize progress, will need post acute rehab <3hrs/day to continue to improve ability to participate in purposeful activities, improve cognition, and maximize independence.      Recommendations for follow up therapy are one component of a multi-disciplinary discharge planning process, led by the attending physician.  Recommendations may be updated based on patient status, additional functional criteria and insurance authorization.   Assistance Recommended at Discharge Frequent or constant Supervision/Assistance  Patient can return home with the following Two people to help with walking and/or transfers;Two people to help with bathing/dressing/bathroom;Assistance with cooking/housework;Assistance with feeding;Direct supervision/assist for medications management;Direct supervision/assist for financial management;Assist for transportation;Help with stairs or ramp for entrance    Functional Status Assessment  Patient has had a recent decline in their functional status and demonstrates the ability to make significant improvements in function in a reasonable and predictable amount of  time.  Equipment Recommendations  Other (comment) (defer)    Recommendations for Other Services       Precautions / Restrictions Precautions Precautions: Fall;Other (comment) Precaution Comments: seizure Restrictions Weight Bearing Restrictions: No      Mobility Bed Mobility Overal bed mobility: Needs Assistance Bed Mobility: Supine to Sit     Supine to sit: Mod assist, HOB elevated     General bed mobility comments: Pt attempted to sit up on EOB, very restless and anxious, after sitting up in bed Pt lied back down and went to sleep.    Transfers Overall transfer level: Needs assistance                 General transfer comment: did not attempt due to Pt unable to assist to EOB or follow commands      Balance Overall balance assessment: Needs assistance                                         ADL either performed or assessed with clinical judgement   ADL Overall ADL's : Needs assistance/impaired Eating/Feeding: NPO                                     General ADL Comments: Pt total for ADLs at this time due to impaired cognition and decreased ability for purposeful movement, not able to follow commands.     Vision Baseline Vision/History: 1 Wears glasses Ability to See in Adequate Light: 0 Adequate Patient Visual Report: No change from baseline       Perception     Praxis      Pertinent Vitals/Pain  Pain Assessment Pain Assessment: Faces Faces Pain Scale: Hurts little more Pain Location: generalized Pain Descriptors / Indicators: Discomfort Pain Intervention(s): Monitored during session     Hand Dominance     Extremity/Trunk Assessment Upper Extremity Assessment Upper Extremity Assessment: Difficult to assess due to impaired cognition   Lower Extremity Assessment Lower Extremity Assessment: Defer to PT evaluation   Cervical / Trunk Assessment Cervical / Trunk Assessment: Kyphotic   Communication  Communication Communication: Expressive difficulties;Receptive difficulties (not following commands, not verbalizing)   Cognition Arousal/Alertness: Awake/alert Behavior During Therapy: Restless, Anxious, Impulsive Overall Cognitive Status: Impaired/Different from baseline Area of Impairment: Attention, Following commands, Safety/judgement, Awareness, Problem solving                   Current Attention Level: Focused   Following Commands: Follows one step commands inconsistently Safety/Judgement: Decreased awareness of safety, Decreased awareness of deficits Awareness: Intellectual Problem Solving: Difficulty sequencing, Decreased initiation General Comments: Pt not able to respond or follow commands     General Comments  Family at bedside providing full history and very supportive. Pt impulsive and restless throughout session, attempting to pull at lines and leads    Exercises     Shoulder Instructions      Home Living Family/patient expects to be discharged to:: Private residence Living Arrangements: Children Available Help at Discharge: Family;Available 24 hours/day Type of Home: House Home Access: Stairs to enter [employer] of Steps: 4 Entrance Stairs-Rails: Right;Left;Can reach both Home Layout: Two level;Able to live on main level with bedroom/bathroom Alternate Level Stairs-Number of Steps: pt stays on first level   Bathroom Shower/Tub: Chief Strategy Officer: Standard     Home Equipment: None   Additional Comments: lives with son and daughter in law, when they are at work her other son stays with her. Family provided full history      Prior Functioning/Environment Prior Level of Function : Independent/Modified Independent             Mobility Comments: independent at baseline, no AD, family reports once fall causing this admission ADLs Comments: independent, manages her own finances        OT Problem List: Decreased  activity tolerance;Impaired balance (sitting and/or standing);Decreased coordination;Decreased cognition;Decreased safety awareness;Decreased knowledge of use of DME or AE;Decreased knowledge of precautions      OT Treatment/Interventions: Self-care/ADL training;Therapeutic exercise;Neuromuscular education;DME and/or AE instruction;Therapeutic activities;Cognitive remediation/compensation;Patient/family education    OT Goals(Current goals can be found in the care plan section) Acute Rehab OT Goals Patient Stated Goal: unable to participate in goal setting OT Goal Formulation: Patient unable to participate in goal setting Time For Goal Achievement: 03/10/23 Potential to Achieve Goals: Fair  OT Frequency: Min 2X/week    Co-evaluation              AM-PAC OT "6 Clicks" Daily Activity     Outcome Measure Help from another person eating meals?: Total Help from another person taking care of personal grooming?: Total Help from another person toileting, which includes using toliet, bedpan, or urinal?: Total Help from another person bathing (including washing, rinsing, drying)?: Total Help from another person to put on and taking off regular upper body clothing?: Total Help from another person to put on and taking off regular lower body clothing?: Total 6 Click Score: 6   End of Session Nurse Communication: Mobility status  Activity Tolerance: Other (comment) (limited due to decreased cognition) Patient left: in bed;with call bell/phone within reach;with bed alarm set;with  family/visitor present  OT Visit Diagnosis: Unsteadiness on feet (R26.81);Other abnormalities of gait and mobility (R26.89);Muscle weakness (generalized) (M62.81);Other symptoms and signs involving cognitive function;Pain                Time: 1610-9604 OT Time Calculation (min): 12 min Charges:  OT General Charges $OT Visit: 1 Visit OT Evaluation $OT Eval High Complexity: 1 [address], OTR/L   Alexis Goodell 02/24/2023, 4:05 PM

## 2023-02-24 NOTE — Progress Notes (Signed)
Neurology Progress Note   S:// Family and RN at the bedside.  Patient is connected to LTM. Patient has eyes closed and does not respond to voice, however will open eyes to tactile stimulation and will localize pain.  She is not answering questions or following commands.  She has a slight leftward gaze but will cross midline.  Moves all extremities spontaneously LTM this morning with continuous generalized slowing seizures or epileptiform discharges identified  Labs Ammonia 23 TSH 1.085 UA negative UDS negative  Patient scheduled for MRI today  O:// Current vital signs: BP (!) 128/52 (BP Location: Left Arm)   Pulse 100   Temp 98.2 F (36.8 C) (Oral)   Resp 20   Ht 5\' 6"  (1.676 m)   Wt 107.5 kg   SpO2 94%   BMI 38.25 kg/m  Vital signs in last 24 hours: Temp:  [98.2 F (36.8 C)-99.6 F (37.6 C)] 98.2 F (36.8 C) (07/04 0906) Pulse Rate:  [82-100] 100 (07/04 0906) Resp:  [15-22] 20 (07/04 0906) BP: (75-146)/(52-93) 128/52 (07/04 0906) SpO2:  [93 %-100 %] 94 % (07/03 2356) Weight:  [107.5 kg] 107.5 kg (07/03 1842)  GENERAL: Lethargic in no apparent distress HEENT: - Normocephalic and atraumatic, dry mm LUNGS - Clear to auscultation bilaterally with no wheezes CV - S1S2 RRR, no m/r/g, equal pulses bilaterally. ABDOMEN - Soft, nontender, nondistended with normoactive BS Ext: warm, well perfused, intact peripheral pulses, no edema NEURO:  Mental Status: She is lethargic eyes are closed.  Opens eyes to painful stimuli, does not answer questions more follow commands.  Poor attention and she continuously falls back to sleep during exam Language: speech none.  Naming objects or repeating  Cranial Nerves: PERRL. EOMI-slight leftward gaze but does cross midline, visual fields full to threat bilaterally, no facial asymmetry, facial sensation intact, hearing intact, tongue/uvula/soft palate midline, normal sternocleidomastoid and trapezius muscle strength. No evidence of tongue  atrophy or fibrillations Motor: Spontaneously moves all 4 extremities localizes to painful stimuli Tone: is normal and bulk is normal Sensation-responds to noxious stimuli Coordination: Unable to assess due to AMS Gait- deferred  Medications  Current Facility-Administered Medications:    0.9 %  sodium chloride infusion, 250 mL, Intravenous, PRN, Janalyn Shy, Subrina, MD   acetaminophen (TYLENOL) tablet 650 mg, 650 mg, Oral, Q4H PRN **OR** acetaminophen (TYLENOL) suppository 650 mg, 650 mg, Rectal, Q4H PRN, Janalyn Shy, Subrina, MD   chlorhexidine gluconate (MEDLINE KIT) (PERIDEX) 0.12 % solution 15 mL, 15 mL, Mouth Rinse, BID, Sundil, Subrina, MD   dextrose 5 %-0.45 % sodium chloride infusion, , Intravenous, Continuous, Leroy Sea, MD, Last Rate: 75 mL/hr at 02/24/23 0841, New Bag at 02/24/23 0841   hydrALAZINE (APRESOLINE) injection 10 mg, 10 mg, Intravenous, Q6H PRN, Sundil, Subrina, MD   insulin aspart (novoLOG) injection 0-9 Units, 0-9 Units, Subcutaneous, Q4H, Julian Reil, Jared M, DO, 2 Units at 02/24/23 0453   LORazepam (ATIVAN) injection 4 mg, 4 mg, Intravenous, Q5 Min x 2 PRN, Sundil, Subrina, MD   ondansetron (ZOFRAN) tablet 4 mg, 4 mg, Oral, Q6H PRN **OR** ondansetron (ZOFRAN) injection 4 mg, 4 mg, Intravenous, Q6H PRN, Janalyn Shy, Subrina, MD   Oral care mouth rinse, 15 mL, Mouth Rinse, 10 times per day, Sundil, Subrina, MD, 15 mL at 02/24/23 0620   pantoprazole (PROTONIX) injection 40 mg, 40 mg, Intravenous, Q24H, Singh, Prashant K, MD   potassium chloride 10 mEq in 100 mL IVPB, 10 mEq, Intravenous, Q1 Hr x 4, Singh, Stanford Scotland, MD, Last Rate: 100 mL/hr  at 02/24/23 0845, 10 mEq at 02/24/23 0845   sodium chloride flush (NS) 0.9 % injection 3 mL, 3 mL, Intravenous, Q12H, Sundil, Subrina, MD   sodium chloride flush (NS) 0.9 % injection 3 mL, 3 mL, Intravenous, PRN, Sundil, Subrina, MD  Labs CBC    Component Value Date/Time   WBC 9.1 02/24/2023 0154   RBC 4.71 02/24/2023 0154   HGB 12.9  02/24/2023 0154   HCT 41.3 02/24/2023 0154   PLT 138 (L) 02/24/2023 0154   MCV 87.7 02/24/2023 0154   MCV 90.5 01/30/2014 1312   MCH 27.4 02/24/2023 0154   MCHC 31.2 02/24/2023 0154   RDW 12.4 02/24/2023 0154   LYMPHSABS 1.4 02/23/2023 1859   MONOABS 0.5 02/23/2023 1859   EOSABS 0.0 02/23/2023 1859   BASOSABS 0.0 02/23/2023 1859    CMP     Component Value Date/Time   NA 134 (L) 02/24/2023 0154   K 3.4 (L) 02/24/2023 0154   CL 98 02/24/2023 0154   CO2 19 (L) 02/24/2023 0154   GLUCOSE 181 (H) 02/24/2023 0154   BUN 15 02/24/2023 0154   CREATININE 1.50 (H) 02/24/2023 0154   CALCIUM 9.4 02/24/2023 0154   PROT 7.2 02/24/2023 0154   ALBUMIN 3.6 02/24/2023 0154   AST 17 02/24/2023 0154   ALT 14 02/24/2023 0154   ALKPHOS 35 (L) 02/24/2023 0154   BILITOT 1.1 02/24/2023 0154   GFRNONAA 35 (L) 02/24/2023 0154   GFRAA 63 (L) 11/27/2012 0915    Lipid Panel  No results found for: "CHOL", "TRIG", "HDL", "CHOLHDL", "VLDL", "LDLCALC", "LDLDIRECT"  Lab Results  Component Value Date   HGBA1C 7.1 (H) 02/23/2023     Imaging I have reviewed images in epic and the results pertinent to this consultation are:  CT-scan of the brain-no acute abnormality.  Aspects 10 CT a head and neck-no LVO  Routine EEG  7/4: This study is suggestive of severe diffuse encephalopathy, nonspecific etiology. No seizures or epileptiform discharges were seen throughout the recording.   LTM 7/4: This study is suggestive of severe diffuse encephalopathy, nonspecific etiology. No seizures or epileptiform discharges were seen throughout the recording.   Assessment:  77 y.o. female with PMH significant for GERD, DM2, HTN, HLD who was last seen normal at dinner last night around 1900. In the middle of the night, fell next to bed and sat for 2 hours before calling for her son to help.  While in the ED, she had a GTC seizure and is post ictal now.   Recommendations: -Seizure precautions  -Continue LTM - MRI Brain  when able -Check vitamin B12 and folate -Neurology will continue to follow  Gevena Mart DNP, ACNPC-AG    Attending Neurohospitalist Addendum Patient seen and examined with APP/Resident. Agree with the history and physical as documented above. Agree with the plan as documented, which I helped formulate. I have edited the note above to reflect my full findings and recommendations. I have independently reviewed the chart, obtained history, review of systems and examined the patient.I have personally reviewed pertinent head/neck/spine imaging (CT/MRI). Please feel free to call with any questions.  Patient remains encephalopathic. MRI brain motion degraded but diffusion sequences showed no e/o acute infarct. Will reconnect to LTM.  -- Bing Neighbors, MD [facility] [phone]  If 7pm- 7am, please page neurology on call as listed in [facility]ION.

## 2023-02-24 NOTE — Progress Notes (Signed)
LTM EEG hooked up and running - no initial skin breakdown - push button tested - [facility] monitoring.  

## 2023-02-24 NOTE — Evaluation (Signed)
Physical Therapy Evaluation Patient Details Name: Janice Payne MRN: [id number] DOB: 1946-02-08 Today's Date: 02/24/2023  History of Present Illness  77 y/o F admitted to [facility] on 7/3 with complaints of unable to speak fluently. In the ER, pt had a seizure and got progressively confused. PMHx: HTN, DM, HLD, GERD  Clinical Impression  Pt presents today with impaired mobility, limited by cognition and ability to follow commands, as well as balance. Pt not verbalizing throughout today's session, no command following noted, however pt attempting to get OOB upon arrival. ModA provided to get pt to EOB for trunk support and balance, with pt scooting to the EOB. Pt attempting to stand, but unable to complete safely, even with 2 person support, as pt continuing to scoot with standing attempts and increasing her risk of falls. TotalAx2 required to return pt to supine, and pt placed in chair position in bed, but occasionally attempting to roll to the left and get OOB, family very supportive and attempting to redirect pt. Acute PT will continue to follow up with pt as appropriate to progress mobility, recommend subacute PT at this time but anticipate good progression with cognitive improvement. Will continue to follow.         Assistance Recommended at Discharge Frequent or constant Supervision/Assistance  If plan is discharge home, recommend the following:  Can travel by private vehicle  Two people to help with walking and/or transfers;Two people to help with bathing/dressing/bathroom;Assistance with cooking/housework;Direct supervision/assist for medications management;Direct supervision/assist for financial management;Assist for transportation;Help with stairs or ramp for entrance   No    Equipment Recommendations None recommended by PT  Recommendations for Other Services       Functional Status Assessment Patient has had a recent decline in their functional status and demonstrates the ability to  make significant improvements in function in a reasonable and predictable amount of time.     Precautions / Restrictions Precautions Precautions: Fall;Other (comment) Precaution Comments: seizure Restrictions Weight Bearing Restrictions: No      Mobility  Bed Mobility Overal bed mobility: Needs Assistance Bed Mobility: Supine to Sit, Sit to Supine     Supine to sit: Mod assist, HOB elevated, +2 for safety/equipment Sit to supine: Total assist, +2 for physical assistance, +2 for safety/equipment   General bed mobility comments: Pt attempting to get OOB, modA provided for trunk support and attempting to initiate BLE, once at EOB, TotalAx2 required to get back to supine.    Transfers Overall transfer level: Needs assistance                 General transfer comment: Pt attempting to stand but unable to complete safely with 2 person assist as pt not following commands and remains impulsive. Pt scooted to EOB but when attempting to stand, kept scooting forward, increasing risk of falling or scooting off the bed    Ambulation/Gait               General Gait Details: not attempted today  Stairs            Wheelchair Mobility     Tilt Bed    Modified Rankin (Stroke Patients Only)       Balance Overall balance assessment: Needs assistance Sitting-balance support: Bilateral upper extremity supported, Feet supported Sitting balance-Leahy Scale: Fair Sitting balance - Comments: sitting at EOB but requires close guard for safety due to cognition  Pertinent Vitals/Pain Pain Assessment Pain Assessment: Faces Faces Pain Scale: Hurts a little bit Pain Location: generalized Pain Descriptors / Indicators: Discomfort Pain Intervention(s): Monitored during session, Repositioned    Home Living Family/patient expects to be discharged to:: Private residence Living Arrangements: Children Available Help at  Discharge: Family;Available 24 hours/day Type of Home: House Home Access: Stairs to enter Entrance Stairs-Rails: Right;Left;Can reach both Entrance Stairs-Number of Steps: 4 Alternate Level Stairs-Number of Steps: pt stays on first level Home Layout: Two level;Able to live on main level with bedroom/bathroom Home Equipment: None Additional Comments: lives with son and daughter in law, when they are at work her other son stays with her. Family provided full history    Prior Function Prior Level of Function : Independent/Modified Independent             Mobility Comments: independent at baseline, no AD, family reports once fall causing this admission ADLs Comments: independent, manages her own finances     Hand Dominance        Extremity/Trunk Assessment   Upper Extremity Assessment Upper Extremity Assessment: Defer to OT evaluation    Lower Extremity Assessment Lower Extremity Assessment: Difficult to assess due to impaired cognition (moving BLE against gravity but pt not following commands for formal assessment)    Cervical / Trunk Assessment Cervical / Trunk Assessment: Kyphotic  Communication   Communication: Expressive difficulties;Receptive difficulties (not following commands, not verbalizing)  Cognition Arousal/Alertness: Awake/alert Behavior During Therapy: Restless, Impulsive Overall Cognitive Status: Impaired/Different from baseline Area of Impairment: Attention, Following commands, Safety/judgement, Awareness, Problem solving                   Current Attention Level: Focused   Following Commands: Follows one step commands inconsistently Safety/Judgement: Decreased awareness of safety, Decreased awareness of deficits Awareness: Intellectual Problem Solving: Difficulty sequencing, Decreased initiation General Comments: pt not following any commands today, impulsively trying to get OOB, difficulty redirecting        General Comments General  comments (skin integrity, edema, etc.): Family at bedside providing full history and very supportive. Pt impulsive and restless throughout session, attempting to pull at lines and leads    Exercises     Assessment/Plan    PT Assessment Patient needs continued PT services  PT Problem List Decreased activity tolerance;Decreased balance;Decreased cognition;Decreased mobility;Decreased safety awareness;Decreased knowledge of precautions;Decreased knowledge of use of DME       PT Treatment Interventions DME instruction;Gait training;Functional mobility training;Stair training;Therapeutic exercise;Therapeutic activities;Balance training;Neuromuscular re-education;Cognitive remediation;Patient/family education    PT Goals (Current goals can be found in the Care Plan section)  Acute Rehab PT Goals Patient Stated Goal: family reports the goal is for pt to return home PT Goal Formulation: With family Time For Goal Achievement: 03/10/23 Potential to Achieve Goals: Fair    Frequency Min 3X/week     Co-evaluation               AM-PAC PT "6 Clicks" Mobility  Outcome Measure Help needed turning from your back to your side while in a flat bed without using bedrails?: A Lot Help needed moving from lying on your back to sitting on the side of a flat bed without using bedrails?: A Lot Help needed moving to and from a bed to a chair (including a wheelchair)?: Total Help needed standing up from a chair using your arms (e.g., wheelchair or bedside chair)?: Total Help needed to walk in hospital room?: Total Help needed climbing 3-5 steps with a railing? :  Total 6 Click Score: 8    End of Session   Activity Tolerance: Treatment limited secondary to agitation Patient left: in bed;with call bell/phone within reach;with bed alarm set;with family/visitor present Nurse Communication: Mobility status;Other (comment) (pt continuing to try to get OOB) PT Visit Diagnosis: Other abnormalities of gait  and mobility (R26.89);Unsteadiness on feet (R26.81);History of falling (Z91.81)    Time: 1610-9604 PT Time Calculation (min) (ACUTE ONLY): 19 min   Charges:   PT Evaluation $PT Eval Moderate Complexity: 1 Mod   PT General Charges $$ ACUTE PT VISIT: 1 Visit         Lindalou Hose, PT DPT Acute Rehabilitation Services Office [phone]   Leonie Man 02/24/2023, 3:19 PM

## 2023-02-24 NOTE — Progress Notes (Signed)
LTM EEG  rehooked up and running - no initial skin breakdown - push button tested - [facility] monitoring. CT leads used

## 2023-02-24 NOTE — Progress Notes (Signed)
PROGRESS NOTE                                                                                                                                                                                                             Patient Demographics:    Janice Payne, is a 77 y.o. female, DOB - 09/17/1945, WUJ:[id number]  Outpatient Primary MD for the patient is Hemberg, Ruby Cola, NP    LOS - 1  Admit date - 02/23/2023    Chief Complaint  Patient presents with   Fall   Weakness       Brief Narrative (HPI from H&P)   77 year old female medical history of hypertension, diabetes mellitus type 2, hyperlipidemia, GERD, who presented to emergency department with complaint of unable to speak fluently.  In the ER she had an episode of seizure, she got progressively confused and she was admitted to the hospital for further care.  Neurology was consulted.   Subjective:    Ferrell Bieschke today has, No headache, No chest pain, No abdominal pain - No Nausea, No new weakness tingling or numbness, no SOB.   Assessment  & Plan :   Metabolic encephalopathy, new seizure, postictal state.  Neurology on board, EEG being done, MRI ordered, per neurology received dose of Keppra in the ER, further management of neurological issue to neurology.  Case discussed with neurologist on 02/24/2023.  TSH is stable, have also added an ammonia level.  Mechanical fall.  Head CT negative, CTA negative, x-ray pelvis stable, currently unable to provide any review of systems due to postictal state and confusion, advance activity PT OT and monitor.  Mild hyponatremia.  Monitor.  Hypokalemia.  Replaced.  Essential hypertension.  For now as needed hydralazine.  CKD 3B.  creatinine baseline 1.6.  Monitor.  GERD.  IV PPI:  DM type II.  For now sliding scale.  Lab Results  Component Value Date   HGBA1C 7.1 (H) 02/23/2023   CBG (last 3)  Recent Labs     02/23/23 2257 02/24/23 0437  GLUCAP 184* 171*        Condition - Extremely Guarded  Family Communication  : Son bedside on 02/24/2023  Code Status : Full code  Consults  : Neurology  PUD Prophylaxis : PPI   Procedures  :     CT - 1. No  emergent large vessel occlusion or hemodynamically significant stenosis of the head or neck. 2. 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 3. Incidental thyroid nodule measuring 1.5 cm. Recommend non-emergent thyroid ultrasound  EEG   MRI      Disposition Plan  :    Status is: Inpatient  DVT Prophylaxis  :    Place and maintain sequential compression device Start: 02/24/23 0808 SCDs Start: 02/23/23 2141    Lab Results  Component Value Date   PLT 138 (L) 02/24/2023    Diet :  Diet Order             Diet NPO time specified  Diet effective now                    Inpatient Medications  Scheduled Meds:  chlorhexidine gluconate (MEDLINE KIT)  15 mL Mouth Rinse BID   insulin aspart  0-9 Units Subcutaneous Q4H   mouth rinse  15 mL Mouth Rinse 10 times per day   sodium chloride flush  3 mL Intravenous Q12H   Continuous Infusions:  sodium chloride     dextrose 5 % and 0.45% NaCl     potassium chloride     PRN Meds:.sodium chloride, acetaminophen **OR** acetaminophen, hydrALAZINE, LORazepam, ondansetron **OR** ondansetron (ZOFRAN) IV, sodium chloride flush  Antibiotics  :    Anti-infectives (From admission, onward)    None         Objective:   Vitals:   02/23/23 2240 02/23/23 2242 02/23/23 2356 02/24/23 0352  BP: (!) 75/65 (!) 122/59 128/75 138/80  Pulse: 95 92 96   Resp:  20 18 19   Temp: 98.7 F (37.1 C)  98.8 F (37.1 C) 99.6 F (37.6 C)  TempSrc: Axillary  Axillary Oral  SpO2: 93% 94% 94%   Weight:      Height:        Wt Readings from Last 3 Encounters:  02/23/23 107.5 kg  03/30/21 107.5 kg  01/02/21 107.5 kg     Intake/Output Summary (Last 24 hours) at 02/24/2023  0828 Last data filed at 02/24/2023 0600 Gross per 24 hour  Intake 534.11 ml  Output --  Net 534.11 ml     Physical Exam  Awake Alert, No new F.N deficits, Normal affect [place].AT,PERRAL Supple Neck, No JVD,   Symmetrical Chest wall movement, Good air movement bilaterally, CTAB RRR,No Gallops,Rubs or new Murmurs,  +ve B.Sounds, Abd Soft, No tenderness,   No Cyanosis, Clubbing or edema     Data Review:    Recent Labs  Lab 02/23/23 1859 02/23/23 1932 02/24/23 0154  WBC 8.3  --  9.1  HGB 12.5 12.6 12.9  HCT 37.2 37.0 41.3  PLT 162  --  138*  MCV 86.1  --  87.7  MCH 28.9  --  27.4  MCHC 33.6  --  31.2  RDW 12.5  --  12.4  LYMPHSABS 1.4  --   --   MONOABS 0.5  --   --   EOSABS 0.0  --   --   BASOSABS 0.0  --   --     Recent Labs  Lab 02/23/23 1859 02/23/23 1932 02/24/23 0154  NA 132* 135 134*  K 3.5 3.6 3.4*  CL 97* 99 98  CO2 24  --  19*  ANIONGAP 11  --  17*  GLUCOSE 129* 124* 181*  BUN 14 15 15   CREATININE 1.52* 1.60* 1.50*  AST 16  --  17  ALT 14  --  14  ALKPHOS 34*  --  35*  BILITOT 1.0  --  1.1  ALBUMIN 3.5  --  3.6  INR 1.0  --   --   TSH  --   --  1.085  HGBA1C 7.1*  --   --   MG 1.8  --   --   CALCIUM 9.2  --  9.4      Recent Labs  Lab 02/23/23 1859 02/24/23 0154  INR 1.0  --   TSH  --  1.085  HGBA1C 7.1*  --   MG 1.8  --   CALCIUM 9.2 9.4    Recent Labs  Lab 02/23/23 1859 02/23/23 1932 02/24/23 0154  WBC 8.3  --  9.1  PLT 162  --  138*  CREATININE 1.52* 1.60* 1.50*      Lab Results  Component Value Date   HGBA1C 7.1 (H) 02/23/2023    Recent Labs    02/24/23 0154  TSH 1.085      Radiology Reports DG Pelvis Portable  Result Date: 02/23/2023 CLINICAL DATA:  Weakness EXAM: PORTABLE PELVIS 1-2 VIEWS COMPARISON:  CT abdomen and pelvis 03/29/2021 FINDINGS: Thin band of sclerosis about the right femoral head neck junction is favored projectional. If there is concern for acute fracture, CT is recommended. No dislocation.  Degenerative changes pubic symphysis, both hips, SI joints and lower lumbar spine. Contrast within the bladder. IMPRESSION: Thin band of sclerosis about the right femoral head neck junction is favored projectional. If there is concern for acute fracture, CT is recommended. Electronically Signed   By: Minerva Fester M.D.   On: 02/23/2023 22:24   DG Chest Port 1 View  Result Date: 02/23/2023 CLINICAL DATA:  Weakness. EXAM: PORTABLE CHEST 1 VIEW COMPARISON:  Chest radiograph dated 01/02/2021. FINDINGS: No focal consolidation, pleural effusion, or pneumothorax. The cardiac silhouette is within normal limits. No acute osseous pathology. IMPRESSION: No active disease. Electronically Signed   By: Elgie Collard M.D.   On: 02/23/2023 20:57   CT ANGIO HEAD NECK W WO CM (CODE STROKE)  Result Date: 02/23/2023 CLINICAL DATA:  Left facial droop EXAM: CT ANGIOGRAPHY HEAD AND NECK WITH AND WITHOUT CONTRAST TECHNIQUE: Multidetector CT imaging of the head and neck was performed using the standard protocol during bolus administration of intravenous contrast. Multiplanar CT image reconstructions and MIPs were obtained to evaluate the vascular anatomy. Carotid stenosis measurements (when applicable) are obtained utilizing NASCET criteria, using the distal internal carotid diameter as the denominator. RADIATION DOSE REDUCTION: This exam was performed according to the departmental dose-optimization program which includes automated exposure control, adjustment of the mA and/or kV according to patient size and/or use of iterative reconstruction technique. CONTRAST:  75mL OMNIPAQUE IOHEXOL 350 MG/ML SOLN COMPARISON:  None Available. FINDINGS: CTA NECK FINDINGS SKELETON: There is no bony spinal canal stenosis. No lytic or blastic lesion. OTHER NECK: Multiple bilateral thyroid nodules measuring up to 1.5 cm. 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 AORTIC ARCH: There is calcific  atherosclerosis of the aortic arch. Conventional 3 vessel aortic branching pattern. RIGHT CAROTID SYSTEM: Normal without aneurysm, dissection or stenosis. LEFT CAROTID SYSTEM: Normal without aneurysm, dissection or stenosis. VERTEBRAL ARTERIES: Left dominant configuration.There is no dissection, occlusion or flow-limiting stenosis to the skull base (V1-V3 segments). CTA HEAD FINDINGS POSTERIOR CIRCULATION: --Vertebral arteries: Normal V4 segments. --Inferior cerebellar arteries: Normal. --Basilar artery: Normal. --Superior cerebellar arteries: Normal. --Posterior cerebral arteries (PCA): Normal.  ANTERIOR CIRCULATION: --Intracranial internal carotid arteries: Atherosclerotic calcification of the internal carotid arteries at the skull base without hemodynamically significant stenosis. --Anterior cerebral arteries (ACA): Normal. Both A1 segments are present. Patent anterior communicating artery (a-comm). --Middle cerebral arteries (MCA): Normal. VENOUS SINUSES: As permitted by contrast timing, patent. ANATOMIC VARIANTS: None Review of the MIP images confirms the above findings. IMPRESSION: 1. No emergent large vessel occlusion or hemodynamically significant stenosis of the head or neck. 2. 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 3. Incidental thyroid nodule measuring 1.5 cm. Recommend non-emergent thyroid ultrasound. Reference: J Am Coll Radiol. 2015 Feb;12(2): 143-50 Aortic Atherosclerosis (ICD10-I70.0). Electronically Signed   By: Deatra Robinson M.D.   On: 02/23/2023 19:31   CT HEAD CODE STROKE WO CONTRAST  Result Date: 02/23/2023 CLINICAL DATA:  Code stroke. Acute neurologic deficit. Left facial droop EXAM: CT HEAD WITHOUT CONTRAST TECHNIQUE: Contiguous axial images were obtained from the base of the skull through the vertex without intravenous contrast. RADIATION DOSE REDUCTION: This exam was performed according to the departmental dose-optimization program which includes automated  exposure control, adjustment of the mA and/or kV according to patient size and/or use of iterative reconstruction technique. COMPARISON:  None Available. FINDINGS: Brain: There is no mass, hemorrhage or extra-axial collection. The size and configuration of the ventricles and extra-axial CSF spaces are normal. The brain parenchyma is normal, without evidence of acute or chronic infarction. Vascular: No abnormal hyperdensity of the major intracranial arteries or dural venous sinuses. No intracranial atherosclerosis. Skull: The visualized skull base, calvarium and extracranial soft tissues are normal. Sinuses/Orbits: No fluid levels or advanced mucosal thickening of the visualized paranasal sinuses. No mastoid or middle ear effusion. The orbits are normal. ASPECTS [facility] Stroke Program Early CT Score) - Ganglionic level infarction (caudate, lentiform nuclei, internal capsule, insula, M1-M3 cortex): 7 - Supraganglionic infarction (M4-M6 cortex): 3 Total score (0-10 with 10 being normal): 10 IMPRESSION: 1. No acute intracranial abnormality. 2. ASPECTS is 10. These results were communicated to Dr. Erick Blinks at 7:23 pm on 02/23/2023 by text page via the [facility] messaging system. Electronically Signed   By: Deatra Robinson M.D.   On: 02/23/2023 19:23      Signature  -   Susa Raring M.D on 02/24/2023 at 8:28 AM   -  To page go to [email or web address]

## 2023-02-24 NOTE — Progress Notes (Signed)
Speech Language Pathology  Patient Details Name: Janice Payne MRN: [id number] DOB: May 29, 1946 Today's Date: 02/24/2023 Time:  -      Attempted swallow assessment however could not arouse pt with max stimuli. Opens eyes for 2 seconds then closes and falls back asleep. Will plan to reattempt this afternoon.                                 Royce Macadamia  02/24/2023, 9:16 AM

## 2023-02-24 NOTE — Procedures (Signed)
Patient Name: Janice Payne  MRN: [id number]  Epilepsy Attending: Charlsie Quest  Referring Physician/Provider: Tereasa Coop, MD  Date: 02/23/2023 Duration: 24.03 mins  Patient history:  77yo F with ams getting eeg to evaluate for seizure.  Level of alertness:  lethargic   AEDs during EEG study: None  Technical aspects: This EEG study was done with scalp electrodes positioned according to the 10-20 International system of electrode placement. Electrical activity was reviewed with band pass filter of 1-70Hz , sensitivity of 7 uV/mm, display speed of 40mm/sec with a 60Hz  notched filter applied as appropriate. EEG data were recorded continuously and digitally stored.  Video monitoring was available and reviewed as appropriate.  Description: EEG showed continuous generalized rhythmic 2-3hz  delta slowing. Hyperventilation and photic stimulation were not performed.      ABNORMALITY - Continuous slow, generalized   IMPRESSION: This study is suggestive of severe diffuse encephalopathy, nonspecific etiology. No seizures or epileptiform discharges were seen throughout the recording.   Daniela Hernan Annabelle Harman

## 2023-02-24 NOTE — Procedures (Signed)
Patient Name: Janice Payne  MRN: [id number]  Epilepsy Attending: Charlsie Quest  Referring Physician/Provider: Erick Blinks, MD  Duration: 02/24/2023 0044 to 02/24/2023 4098  Patient history: 77yo F with ams getting eeg to evaluate for seizure.  Level of alertness:  lethargic   AEDs during EEG study: None  Technical aspects: This EEG study was done with scalp electrodes positioned according to the 10-20 International system of electrode placement. Electrical activity was reviewed with band pass filter of 1-70Hz , sensitivity of 7 uV/mm, display speed of 75mm/sec with a 60Hz  notched filter applied as appropriate. EEG data were recorded continuously and digitally stored.  Video monitoring was available and reviewed as appropriate.  Description: EEG showed continuous generalized rhythmic 2-3hz  delta slowing. Hyperventilation and photic stimulation were not performed.     ABNORMALITY - Continuous slow, generalized  IMPRESSION: This study is suggestive of severe diffuse encephalopathy, nonspecific etiology. No seizures or epileptiform discharges were seen throughout the recording.  Janice Payne

## 2023-02-25 ENCOUNTER — Inpatient Hospital Stay (HOSPITAL_COMMUNITY): Payer: Medicare Other

## 2023-02-25 DIAGNOSIS — R569 Unspecified convulsions: Secondary | ICD-10-CM | POA: Diagnosis not present

## 2023-02-25 DIAGNOSIS — R4701 Aphasia: Secondary | ICD-10-CM | POA: Diagnosis not present

## 2023-02-25 DIAGNOSIS — R531 Weakness: Secondary | ICD-10-CM

## 2023-02-25 DIAGNOSIS — R2990 Unspecified symptoms and signs involving the nervous system: Secondary | ICD-10-CM | POA: Diagnosis not present

## 2023-02-25 LAB — BASIC METABOLIC PANEL
Anion gap: 9 (ref 5–15)
BUN: 11 mg/dL (ref 8–23)
CO2: 22 mmol/L (ref 22–32)
Calcium: 9 mg/dL (ref 8.9–10.3)
Chloride: 101 mmol/L (ref 98–111)
Creatinine, Ser: 1.34 mg/dL — ABNORMAL HIGH (ref 0.44–1.00)
GFR, Estimated: 41 mL/min — ABNORMAL LOW (ref >60)
Glucose, Bld: 185 mg/dL — ABNORMAL HIGH (ref 70–99)
Potassium: 3.5 mmol/L (ref 3.5–5.1)
Sodium: 132 mmol/L — ABNORMAL LOW (ref 135–145)

## 2023-02-25 LAB — HEPATIC FUNCTION PANEL
ALT: 12 U/L (ref 0–44)
AST: 21 U/L (ref 15–41)
Albumin: 3.3 g/dL — ABNORMAL LOW (ref 3.5–5.0)
Alkaline Phosphatase: 39 U/L (ref 38–126)
Bilirubin, Direct: 0.3 mg/dL — ABNORMAL HIGH (ref 0.0–0.2)
Indirect Bilirubin: 0.5 mg/dL (ref 0.3–0.9)
Total Bilirubin: 0.8 mg/dL (ref 0.3–1.2)
Total Protein: 6.8 g/dL (ref 6.5–8.1)

## 2023-02-25 LAB — GLUCOSE, CAPILLARY
Glucose-Capillary: 171 mg/dL — ABNORMAL HIGH (ref 70–99)
Glucose-Capillary: 203 mg/dL — ABNORMAL HIGH (ref 70–99)
Glucose-Capillary: 170 mg/dL — ABNORMAL HIGH (ref 70–99)
Glucose-Capillary: 167 mg/dL — ABNORMAL HIGH (ref 70–99)
Glucose-Capillary: 174 mg/dL — ABNORMAL HIGH (ref 70–99)

## 2023-02-25 LAB — CBC WITH DIFFERENTIAL/PLATELET
Abs Immature Granulocytes: 0.1 10*3/uL — ABNORMAL HIGH (ref 0.00–0.07)
Basophils Absolute: 0 10*3/uL (ref 0.0–0.1)
Basophils Relative: 0 %
Eosinophils Absolute: 0 10*3/uL (ref 0.0–0.5)
Eosinophils Relative: 0 %
HCT: 35.5 % — ABNORMAL LOW (ref 36.0–46.0)
Hemoglobin: 11.7 g/dL — ABNORMAL LOW (ref 12.0–15.0)
Immature Granulocytes: 1 %
Lymphocytes Relative: 13 %
Lymphs Abs: 1.2 10*3/uL (ref 0.7–4.0)
MCH: 27.7 pg (ref 26.0–34.0)
MCHC: 33 g/dL (ref 30.0–36.0)
MCV: 83.9 fL (ref 80.0–100.0)
Monocytes Absolute: 0.7 10*3/uL (ref 0.1–1.0)
Monocytes Relative: 8 %
Neutro Abs: 6.9 10*3/uL (ref 1.7–7.7)
Neutrophils Relative %: 78 %
Platelets: 151 10*3/uL (ref 150–400)
RBC: 4.23 MIL/uL (ref 3.87–5.11)
RDW: 12.5 % (ref 11.5–15.5)
WBC: 9 10*3/uL (ref 4.0–10.5)
nRBC: 0 % (ref 0.0–0.2)

## 2023-02-25 LAB — PHOSPHORUS: Phosphorus: 2.8 mg/dL (ref 2.5–4.6)

## 2023-02-25 LAB — SEDIMENTATION RATE: Sed Rate: 34 mm/hr — ABNORMAL HIGH (ref 0–22)

## 2023-02-25 LAB — PROCALCITONIN: Procalcitonin: <0.1 ng/mL

## 2023-02-25 LAB — HIV ANTIBODY (ROUTINE TESTING W REFLEX): HIV Screen 4th Generation wRfx: NONREACTIVE

## 2023-02-25 LAB — BRAIN NATRIURETIC PEPTIDE: B Natriuretic Peptide: 128.9 pg/mL — ABNORMAL HIGH (ref 0.0–100.0)

## 2023-02-25 LAB — C-REACTIVE PROTEIN: CRP: 4.9 mg/dL — ABNORMAL HIGH (ref <1.0)

## 2023-02-25 LAB — AMMONIA: Ammonia: 44 umol/L — ABNORMAL HIGH (ref 9–35)

## 2023-02-25 LAB — MAGNESIUM: Magnesium: 1.8 mg/dL (ref 1.7–2.4)

## 2023-02-25 MED ORDER — PANTOPRAZOLE SODIUM 40 MG PO TBEC
40.0000 mg | DELAYED_RELEASE_TABLET | Freq: Two times a day (BID) | ORAL | Status: DC
  Administered 2023-02-25 – 2023-02-27 (×4): 40 mg via ORAL
  Filled 2023-02-25 (×4): qty 1

## 2023-02-25 MED ORDER — LACTULOSE 10 GM/15ML PO SOLN
20.0000 g | Freq: Two times a day (BID) | ORAL | Status: DC
  Administered 2023-02-25: 20 g via ORAL
  Filled 2023-02-25 (×4): qty 30

## 2023-02-25 MED ORDER — DEXTROSE-SODIUM CHLORIDE 5-0.45 % IV SOLN: INTRAVENOUS | Status: DC

## 2023-02-25 MED ORDER — LORAZEPAM 2 MG/ML IJ SOLN: 1.0000 mg | Freq: Four times a day (QID) | INTRAMUSCULAR | Status: DC | PRN

## 2023-02-25 MED ORDER — KETOROLAC TROMETHAMINE 30 MG/ML IJ SOLN
30.0000 mg | Freq: Once | INTRAMUSCULAR | Status: AC
  Administered 2023-02-25: 30 mg via INTRAVENOUS
  Filled 2023-02-25: qty 1

## 2023-02-25 MED ORDER — INSULIN ASPART 100 UNIT/ML IJ SOLN
0.0000 [IU] | Freq: Three times a day (TID) | INTRAMUSCULAR | Status: DC
  Administered 2023-02-25 – 2023-02-26 (×2): 2 [IU] via SUBCUTANEOUS
  Administered 2023-02-26 (×2): 1 [IU] via SUBCUTANEOUS
  Administered 2023-02-27: 2 [IU] via SUBCUTANEOUS

## 2023-02-25 MED ORDER — DEXTROSE-NACL 5-0.45 % IV SOLN: INTRAVENOUS | Status: DC

## 2023-02-25 MED ORDER — LACTATED RINGERS IV SOLN: INTRAVENOUS | Status: DC

## 2023-02-25 MED ORDER — ORAL CARE MOUTH RINSE: 15.0000 mL | OROMUCOSAL | Status: DC | PRN

## 2023-02-25 NOTE — Progress Notes (Signed)
PROGRESS NOTE                                                                                                                                                                                                             Patient Demographics:    Janice Payne, is a 77 y.o. female, DOB - 06-Nov-1945, NWG:[id number]  Outpatient Primary MD for the patient is Hemberg, Ruby Cola, NP    LOS - 2  Admit date - 02/23/2023    Chief Complaint  Patient presents with   Fall   Weakness       Brief Narrative (HPI from H&P)   77 year old female medical history of hypertension, diabetes mellitus type 2, hyperlipidemia, GERD, who presented to emergency department with complaint of unable to speak fluently.  In the ER she had an episode of seizure, she got progressively confused and she was admitted to the hospital for further care.  Neurology was consulted.   Subjective:   Patient in bed, appears comfortable, denies any headache, no fever, no chest pain or pressure, no shortness of breath , no abdominal pain. No focal weakness.   Assessment  & Plan :   Metabolic encephalopathy, new seizure, postictal state.  Neurology on board, CT head, MRI brain and EEG nonacute, per neurology received dose of Keppra in the ER. she did have a mild UTI for which she is being treated with Rocephin, overall clinically much improved on 02/25/2023 and mentation getting close to normal.  Continue to monitor defer further management of neurological issues to neurology which is on board.  Mechanical fall.  Head CT negative, CTA negative, x-ray pelvis stable, currently unable to provide any review of systems due to postictal state and confusion, advance activity PT OT and monitor.  UTI present on admission.  Rocephin x 3 days, monitor urine cultures and monitor bladder scans as well.    Mild hyponatremia.  Monitor.  Hypokalemia.  Replaced.  Borderline elevation in  ammonia levels.  Low-dose lactulose.    Essential hypertension.  For now as needed hydralazine.  CKD 3B.  creatinine baseline 1.6.  Monitor.  GERD.  IV PPI:  DM type II.  For now sliding scale.  Lab Results  Component Value Date   HGBA1C 7.1 (H) 02/23/2023   CBG (last 3)  Recent Labs    02/24/23 2101 02/24/23  2311 02/25/23 0355  GLUCAP 206* 187* 171*        Condition - Extremely Guarded  Family Communication  : Jolaine Artist bedside on 02/24/2023, over the phone [phone]   on 02/25/2023  Code Status : Full code  Consults  : Neurology  PUD Prophylaxis : PPI   Procedures  :     CT - 1. No emergent large vessel occlusion or hemodynamically significant stenosis of the head or neck. 2. 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 3. Incidental thyroid nodule measuring 1.5 cm. Recommend non-emergent thyroid ultrasound  EEG - Non specific changes  MRI - motion artifact but no acute CVA      Disposition Plan  :    Status is: Inpatient  DVT Prophylaxis  :    Place and maintain sequential compression device Start: 02/24/23 0808 SCDs Start: 02/23/23 2141    Lab Results  Component Value Date   PLT 151 02/25/2023    Diet :  Diet Order             Diet NPO time specified Except for: Sips with Meds  Diet effective now                    Inpatient Medications  Scheduled Meds:  chlorhexidine gluconate (MEDLINE KIT)  15 mL Mouth Rinse BID   insulin aspart  0-9 Units Subcutaneous Q4H   lactulose  20 g Oral BID   mouth rinse  15 mL Mouth Rinse 10 times per day   pantoprazole (PROTONIX) IV  40 mg Intravenous Q24H   sodium chloride flush  3 mL Intravenous Q12H   Continuous Infusions:  sodium chloride     cefTRIAXone (ROCEPHIN)  IV 1 g (02/24/23 1608)   dextrose 5 % and 0.45% NaCl 75 mL/hr at 02/24/23 2309   PRN Meds:.sodium chloride, acetaminophen **OR** acetaminophen, haloperidol lactate, hydrALAZINE, LORazepam, LORazepam,  ondansetron **OR** ondansetron (ZOFRAN) IV, sodium chloride flush  Antibiotics  :    Anti-infectives (From admission, onward)    Start     Dose/Rate Route Frequency Ordered Stop   02/24/23 1515  cefTRIAXone (ROCEPHIN) 1 g in sodium chloride 0.9 % 100 mL IVPB        1 g 200 mL/hr over 30 Minutes Intravenous Every 24 hours 02/24/23 1428 02/27/23 1514         Objective:   Vitals:   02/24/23 1629 02/24/23 1900 02/24/23 2313 02/25/23 0358  BP: (!) 151/98 (!) 148/62 134/80 (!) 143/73  Pulse: 90     Resp: (!) 25  (!) 21 20  Temp: 97.6 F (36.4 C) 98.5 F (36.9 C) 98.9 F (37.2 C) 98.4 F (36.9 C)  TempSrc: Oral Oral Axillary Oral  SpO2:      Weight:      Height:        Wt Readings from Last 3 Encounters:  02/23/23 107.5 kg  03/30/21 107.5 kg  01/02/21 107.5 kg     Intake/Output Summary (Last 24 hours) at 02/25/2023 0758 Last data filed at 02/25/2023 0600 Gross per 24 hour  Intake 1389.71 ml  Output --  Net 1389.71 ml     Physical Exam  Awake Alert, No new F.N deficits, Normal affect [place].AT,PERRAL Supple Neck, No JVD,   Symmetrical Chest wall movement, Good air movement bilaterally, CTAB RRR,No Gallops,Rubs or new Murmurs,  +ve B.Sounds, Abd Soft, No tenderness,   No Cyanosis, Clubbing or edema     Data Review:  Recent Labs  Lab 02/23/23 1859 02/23/23 1932 02/24/23 0154 02/25/23 0432  WBC 8.3  --  9.1 9.0  HGB 12.5 12.6 12.9 11.7*  HCT 37.2 37.0 41.3 35.5*  PLT 162  --  138* 151  MCV 86.1  --  87.7 83.9  MCH 28.9  --  27.4 27.7  MCHC 33.6  --  31.2 33.0  RDW 12.5  --  12.4 12.5  LYMPHSABS 1.4  --   --  1.2  MONOABS 0.5  --   --  0.7  EOSABS 0.0  --   --  0.0  BASOSABS 0.0  --   --  0.0    Recent Labs  Lab 02/23/23 1859 02/23/23 1932 02/24/23 0154 02/24/23 1220 02/25/23 0432  NA 132* 135 134*  --  132*  K 3.5 3.6 3.4*  --  3.5  CL 97* 99 98  --  101  CO2 24  --  19*  --  22  ANIONGAP 11  --  17*  --  9  GLUCOSE 129* 124* 181*  --  185*   BUN 14 15 15   --  11  CREATININE 1.52* 1.60* 1.50*  --  1.34*  AST 16  --  17  --   --   ALT 14  --  14  --   --   ALKPHOS 34*  --  35*  --   --   BILITOT 1.0  --  1.1  --   --   ALBUMIN 3.5  --  3.6  --   --   CRP  --   --   --  1.3* 4.9*  PROCALCITON  --   --   --  <0.10 <0.10  INR 1.0  --   --   --   --   TSH  --   --  1.085  --   --   HGBA1C 7.1*  --   --   --   --   AMMONIA  --   --   --  23 44*  BNP  --   --   --   --  128.9*  MG 1.8  --   --   --  1.8  CALCIUM 9.2  --  9.4  --  9.0      Recent Labs  Lab 02/23/23 1859 02/24/23 0154 02/24/23 1220 02/25/23 0432  CRP  --   --  1.3* 4.9*  PROCALCITON  --   --  <0.10 <0.10  INR 1.0  --   --   --   TSH  --  1.085  --   --   HGBA1C 7.1*  --   --   --   AMMONIA  --   --  23 44*  BNP  --   --   --  128.9*  MG 1.8  --   --  1.8  CALCIUM 9.2 9.4  --  9.0    Lab Results  Component Value Date   HGBA1C 7.1 (H) 02/23/2023    Recent Labs    02/24/23 0154  TSH 1.085      Radiology Reports MR BRAIN WO CONTRAST  Result Date: 02/24/2023 CLINICAL DATA:  Neuro deficit with stroke suspected. EXAM: MRI HEAD WITHOUT CONTRAST TECHNIQUE: Multiplanar, multiecho pulse sequences of the brain and surrounding structures were obtained without intravenous contrast. COMPARISON:  Head CT and CTA from yesterday. FINDINGS: Very truncated and motion degraded study, only diffusion imaging and nondiagnostic sagittal  T1 weighted sequence were obtained. The axial diffusion imaging is diagnostic and negative for acute infarct. No hydrocephalus or midline shift. IMPRESSION: Truncated and motion degraded study but diagnostic diffusion imaging which is negative for acute infarct. Electronically Signed   By: Tiburcio Pea M.D.   On: 02/24/2023 12:03   Overnight EEG with video  Result Date: 02/24/2023 Charlsie Quest, MD     02/24/2023  8:32 AM Patient Name: BERKLEIGH IMHOLTE MRN: [id number] Epilepsy Attending: Charlsie Quest Referring  Physician/Provider: Erick Blinks, MD Duration: 02/24/2023 0044 to 02/24/2023 4098 Patient history: 77yo F with ams getting eeg to evaluate for seizure. Level of alertness:  lethargic AEDs during EEG study: None Technical aspects: This EEG study was done with scalp electrodes positioned according to the 10-20 International system of electrode placement. Electrical activity was reviewed with band pass filter of 1-70Hz , sensitivity of 7 uV/mm, display speed of 70mm/sec with a 60Hz  notched filter applied as appropriate. EEG data were recorded continuously and digitally stored.  Video monitoring was available and reviewed as appropriate. Description: EEG showed continuous generalized rhythmic 2-3hz  delta slowing. Hyperventilation and photic stimulation were not performed.   ABNORMALITY - Continuous slow, generalized IMPRESSION: This study is suggestive of severe diffuse encephalopathy, nonspecific etiology. No seizures or epileptiform discharges were seen throughout the recording. Charlsie Quest   EEG adult now  Result Date: 02/24/2023 Charlsie Quest, MD     02/24/2023  8:34 AM Patient Name: YARIBEL PIETTE MRN: [id number] Epilepsy Attending: Charlsie Quest Referring Physician/Provider: Tereasa Coop, MD Date: 02/23/2023 Duration: 24.03 mins Patient history:  77yo F with ams getting eeg to evaluate for seizure. Level of alertness:  lethargic AEDs during EEG study: None Technical aspects: This EEG study was done with scalp electrodes positioned according to the 10-20 International system of electrode placement. Electrical activity was reviewed with band pass filter of 1-70Hz , sensitivity of 7 uV/mm, display speed of 74mm/sec with a 60Hz  notched filter applied as appropriate. EEG data were recorded continuously and digitally stored.  Video monitoring was available and reviewed as appropriate. Description: EEG showed continuous generalized rhythmic 2-3hz  delta slowing. Hyperventilation and photic stimulation were  not performed.    ABNORMALITY - Continuous slow, generalized  IMPRESSION: This study is suggestive of severe diffuse encephalopathy, nonspecific etiology. No seizures or epileptiform discharges were seen throughout the recording.  Charlsie Quest    DG Pelvis Portable  Result Date: 02/23/2023 CLINICAL DATA:  Weakness EXAM: PORTABLE PELVIS 1-2 VIEWS COMPARISON:  CT abdomen and pelvis 03/29/2021 FINDINGS: Thin band of sclerosis about the right femoral head neck junction is favored projectional. If there is concern for acute fracture, CT is recommended. No dislocation. Degenerative changes pubic symphysis, both hips, SI joints and lower lumbar spine. Contrast within the bladder. IMPRESSION: Thin band of sclerosis about the right femoral head neck junction is favored projectional. If there is concern for acute fracture, CT is recommended. Electronically Signed   By: Minerva Fester M.D.   On: 02/23/2023 22:24   DG Chest Port 1 View  Result Date: 02/23/2023 CLINICAL DATA:  Weakness. EXAM: PORTABLE CHEST 1 VIEW COMPARISON:  Chest radiograph dated 01/02/2021. FINDINGS: No focal consolidation, pleural effusion, or pneumothorax. The cardiac silhouette is within normal limits. No acute osseous pathology. IMPRESSION: No active disease. Electronically Signed   By: Elgie Collard M.D.   On: 02/23/2023 20:57   CT ANGIO HEAD NECK W WO CM (CODE STROKE)  Result Date: 02/23/2023 CLINICAL DATA:  Left facial  droop EXAM: CT ANGIOGRAPHY HEAD AND NECK WITH AND WITHOUT CONTRAST TECHNIQUE: Multidetector CT imaging of the head and neck was performed using the standard protocol during bolus administration of intravenous contrast. Multiplanar CT image reconstructions and MIPs were obtained to evaluate the vascular anatomy. Carotid stenosis measurements (when applicable) are obtained utilizing NASCET criteria, using the distal internal carotid diameter as the denominator. RADIATION DOSE REDUCTION: This exam was performed according  to the departmental dose-optimization program which includes automated exposure control, adjustment of the mA and/or kV according to patient size and/or use of iterative reconstruction technique. CONTRAST:  75mL OMNIPAQUE IOHEXOL 350 MG/ML SOLN COMPARISON:  None Available. FINDINGS: CTA NECK FINDINGS SKELETON: There is no bony spinal canal stenosis. No lytic or blastic lesion. OTHER NECK: Multiple bilateral thyroid nodules measuring up to 1.5 cm. 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 AORTIC ARCH: There is calcific atherosclerosis of the aortic arch. Conventional 3 vessel aortic branching pattern. RIGHT CAROTID SYSTEM: Normal without aneurysm, dissection or stenosis. LEFT CAROTID SYSTEM: Normal without aneurysm, dissection or stenosis. VERTEBRAL ARTERIES: Left dominant configuration.There is no dissection, occlusion or flow-limiting stenosis to the skull base (V1-V3 segments). CTA HEAD FINDINGS POSTERIOR CIRCULATION: --Vertebral arteries: Normal V4 segments. --Inferior cerebellar arteries: Normal. --Basilar artery: Normal. --Superior cerebellar arteries: Normal. --Posterior cerebral arteries (PCA): Normal. ANTERIOR CIRCULATION: --Intracranial internal carotid arteries: Atherosclerotic calcification of the internal carotid arteries at the skull base without hemodynamically significant stenosis. --Anterior cerebral arteries (ACA): Normal. Both A1 segments are present. Patent anterior communicating artery (a-comm). --Middle cerebral arteries (MCA): Normal. VENOUS SINUSES: As permitted by contrast timing, patent. ANATOMIC VARIANTS: None Review of the MIP images confirms the above findings. IMPRESSION: 1. No emergent large vessel occlusion or hemodynamically significant stenosis of the head or neck. 2. 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 3. Incidental thyroid nodule measuring 1.5 cm. Recommend non-emergent thyroid ultrasound.  Reference: J Am Coll Radiol. 2015 Feb;12(2): 143-50 Aortic Atherosclerosis (ICD10-I70.0). Electronically Signed   By: Deatra Robinson M.D.   On: 02/23/2023 19:31   CT HEAD CODE STROKE WO CONTRAST  Result Date: 02/23/2023 CLINICAL DATA:  Code stroke. Acute neurologic deficit. Left facial droop EXAM: CT HEAD WITHOUT CONTRAST TECHNIQUE: Contiguous axial images were obtained from the base of the skull through the vertex without intravenous contrast. RADIATION DOSE REDUCTION: This exam was performed according to the departmental dose-optimization program which includes automated exposure control, adjustment of the mA and/or kV according to patient size and/or use of iterative reconstruction technique. COMPARISON:  None Available. FINDINGS: Brain: There is no mass, hemorrhage or extra-axial collection. The size and configuration of the ventricles and extra-axial CSF spaces are normal. The brain parenchyma is normal, without evidence of acute or chronic infarction. Vascular: No abnormal hyperdensity of the major intracranial arteries or dural venous sinuses. No intracranial atherosclerosis. Skull: The visualized skull base, calvarium and extracranial soft tissues are normal. Sinuses/Orbits: No fluid levels or advanced mucosal thickening of the visualized paranasal sinuses. No mastoid or middle ear effusion. The orbits are normal. ASPECTS [facility] Stroke Program Early CT Score) - Ganglionic level infarction (caudate, lentiform nuclei, internal capsule, insula, M1-M3 cortex): 7 - Supraganglionic infarction (M4-M6 cortex): 3 Total score (0-10 with 10 being normal): 10 IMPRESSION: 1. No acute intracranial abnormality. 2. ASPECTS is 10. These results were communicated to Dr. Erick Blinks at 7:23 pm on 02/23/2023 by text page via the [facility] messaging system. Electronically Signed   By: Chrisandra Netters.D.  On: 02/23/2023 19:23      Signature  -   Susa Raring M.D on 02/25/2023 at 7:58 AM   -  To page go to [email or web address]

## 2023-02-25 NOTE — Progress Notes (Signed)
LTM EEG discontinued - no skin breakdown at unhook.   

## 2023-02-25 NOTE — TOC Progression Note (Addendum)
Transition of Care [facility]) - Progression Note    Patient Details  Name: Janice Payne MRN: [id number] Date of Birth: 1946-04-30  [facility] CM/SW Contact  Gordy Clement, RN Phone Number: 02/25/2023, 2:35 PM  Clinical Narrative:     Patient is being recommended SNF but is declining and would like to go home.  Home Health PT and OT have  been ordered and [facility].  There are no recommendations for DME. AVS updated  Family will transport at DC.  TOC will continue to follow patient for any additional discharge needs           Expected Discharge Plan and Services                                               Social Determinants of Health (SDOH) Interventions SDOH Screenings   Housing: Patient Unable To Answer (02/24/2023)  Tobacco Use: Low Risk  (02/23/2023)    Readmission Risk Interventions     No data to display

## 2023-02-25 NOTE — Procedures (Addendum)
Patient Name: Janice Payne  MRN: [id number]  Epilepsy Attending: Charlsie Quest  Referring Physician/Provider: Mathews Argyle, NP  Duration: 02/24/2023 1722 to 02/25/2023 1116   Patient history: 77yo F with ams getting eeg to evaluate for seizure.   Level of alertness:  lethargic-->awake, asleep   AEDs during EEG study: None   Technical aspects: This EEG study was done with scalp electrodes positioned according to the 10-20 International system of electrode placement. Electrical activity was reviewed with band pass filter of 1-70Hz , sensitivity of 7 uV/mm, display speed of 85mm/sec with a 60Hz  notched filter applied as appropriate. EEG data were recorded continuously and digitally stored.  Video monitoring was available and reviewed as appropriate.   Description: At the beginning of the study, EEG showed continuous generalized 3-6hz  theta-delta slowing. Gradually EEG improved and showed posterior dominant rhythm of 8 Hz activity of moderate voltage (25-35 uV) seen predominantly in posterior head regions, symmetric and reactive to eye opening and eye closing. Sleep was characterized by vertex waves, sleep spindles (12-14 Hz), maximal fronto-central region. Intermittent generalized 3-6Hz  theta-delta slowing. Hyperventilation and photic stimulation were not performed.      ABNORMALITY - Continuous slow, generalized   IMPRESSION: This study was initially suggestive of severe diffuse encephalopathy, nonspecific etiology. Gradually EEG improved and was suggestive of mild to moderate diffuse encephalopathy. No seizures or epileptiform discharges were seen throughout the recording.   Basil Blakesley Annabelle Harman

## 2023-02-25 NOTE — Progress Notes (Signed)
Speech Language Pathology  Patient Details Name: Janice Payne MRN: [id number] DOB: [birth year] Today's Date: 02/25/2023 Time:  -     Spoke with RN who reports pt's mentation is back to baseline. She did Yale swallow and pt passed and has done well with po's since. No follow up needed from ST at this time. Asked RN to reach out if pt should have difficulty.                             Royce Macadamia  02/25/2023, 11:29 AM

## 2023-02-25 NOTE — Progress Notes (Signed)
Neurology Progress Note   S:// MRI 7/4 negative for acute infarct. On exam today, patient c/o 10/10 headache that was not relieved with tylenol, with accompanying nausea. Patient says she gets headaches like this sometimes, but the tylenol usually takes it away. STAT CT ordered, negative.  Overall neurological exam is improved. Patient is alert and interactive, knows she is in the hospital but not sure why, follows all commands, sometimes slow in responses.   LTM negative for 24hours, will discontinue.   CRP elevated at 4.9 (was 1.3 7/4) Ammonia elevated at 44 (was 23 7/4) BNP elevated at 128.9 B12/Folate ok   O:// Current vital signs: BP (!) 146/71 (BP Location: Left Arm)   Pulse 76   Temp 97.6 F (36.4 C) (Oral)   Resp (!) 21   Ht 5\' 6"  (1.676 m)   Wt 107.5 kg   SpO2 94%   BMI 38.25 kg/m  Vital signs in last 24 hours: Temp:  [97.6 F (36.4 C)-98.9 F (37.2 C)] 97.6 F (36.4 C) (07/05 0759) Pulse Rate:  [76-100] 76 (07/05 0759) Resp:  [20-25] 21 (07/05 0759) BP: (128-151)/(52-98) 146/71 (07/05 0759)  GENERAL: Lethargic in no apparent distress HEENT: - Normocephalic and atraumatic, dry mm LUNGS - Clear to auscultation bilaterally with no wheezes CV - S1S2 RRR, no m/r/g, equal pulses bilaterally. ABDOMEN - Soft, nontender, nondistended with normoactive BS Ext: warm, well perfused, intact peripheral pulses, no edema NEURO:  Mental Status: Alert and interactive. Oriented to self, place, year. Follows commands. Sometimes slow with responses.  Language: Clear. Naming intact Cranial Nerves: PERRL. EOMI, visual fields full, no facial asymmetry, facial sensation intact, hearing intact, tongue/uvula/soft palate midline, normal sternocleidomastoid and trapezius muscle strength. No evidence of tongue atrophy or fibrillations, tongue midline. Motor: Spontaneously moves all 4 extremities. 4+/5 in all extremities, somewhat generalized weakness.  Tone: is normal and bulk is  normal Sensation: subjectively symmetric throughout. No extinction to DSS. Coordination: FNF intact Gait- deferred  Medications  Current Facility-Administered Medications:    0.9 %  sodium chloride infusion, 250 mL, Intravenous, PRN, Janalyn Shy, Subrina, MD   acetaminophen (TYLENOL) tablet 650 mg, 650 mg, Oral, Q4H PRN, 650 mg at 02/25/23 0501 **OR** acetaminophen (TYLENOL) suppository 650 mg, 650 mg, Rectal, Q4H PRN, Janalyn Shy, Subrina, MD   cefTRIAXone (ROCEPHIN) 1 g in sodium chloride 0.9 % 100 mL IVPB, 1 g, Intravenous, Q24H, Leroy Sea, MD, Last Rate: 200 mL/hr at 02/24/23 1608, 1 g at 02/24/23 1608   chlorhexidine gluconate (MEDLINE KIT) (PERIDEX) 0.12 % solution 15 mL, 15 mL, Mouth Rinse, BID, Sundil, Subrina, MD, 15 mL at 02/24/23 2025   dextrose 5 %-0.45 % sodium chloride infusion, , Intravenous, Continuous, Thedore Mins, Stanford Scotland, MD   haloperidol lactate (HALDOL) injection 1 mg, 1 mg, Intravenous, Q6H PRN, Leroy Sea, MD, 1 mg at 02/24/23 1420   hydrALAZINE (APRESOLINE) injection 10 mg, 10 mg, Intravenous, Q6H PRN, Sundil, Subrina, MD   insulin aspart (novoLOG) injection 0-9 Units, 0-9 Units, Subcutaneous, Q4H, Julian Reil, Jared M, DO, 2 Units at 02/25/23 0404   lactulose (CHRONULAC) 10 GM/15ML solution 20 g, 20 g, Oral, BID, Singh, Stanford Scotland, MD   LORazepam (ATIVAN) injection 1 mg, 1 mg, Intravenous, Once PRN, Leroy Sea, MD   LORazepam (ATIVAN) injection 4 mg, 4 mg, Intravenous, Q5 Min x 2 PRN, Sundil, Subrina, MD   ondansetron (ZOFRAN) tablet 4 mg, 4 mg, Oral, Q6H PRN **OR** ondansetron (ZOFRAN) injection 4 mg, 4 mg, Intravenous, Q6H PRN, Janalyn Shy, Subrina, MD  Oral care mouth rinse, 15 mL, Mouth Rinse, 10 times per day, Sundil, Subrina, MD, 15 mL at 02/25/23 0202   pantoprazole (PROTONIX) injection 40 mg, 40 mg, Intravenous, Q24H, Susa Raring K, MD, 40 mg at 02/24/23 0942   sodium chloride flush (NS) 0.9 % injection 3 mL, 3 mL, Intravenous, Q12H, Sundil, Subrina, MD, 3  mL at 02/24/23 0944   sodium chloride flush (NS) 0.9 % injection 3 mL, 3 mL, Intravenous, PRN, Sundil, Subrina, MD  Labs CBC    Component Value Date/Time   WBC 9.0 02/25/2023 0432   RBC 4.23 02/25/2023 0432   HGB 11.7 (L) 02/25/2023 0432   HCT 35.5 (L) 02/25/2023 0432   PLT 151 02/25/2023 0432   MCV 83.9 02/25/2023 0432   MCV 90.5 01/30/2014 1312   MCH 27.7 02/25/2023 0432   MCHC 33.0 02/25/2023 0432   RDW 12.5 02/25/2023 0432   LYMPHSABS 1.2 02/25/2023 0432   MONOABS 0.7 02/25/2023 0432   EOSABS 0.0 02/25/2023 0432   BASOSABS 0.0 02/25/2023 0432    CMP     Component Value Date/Time   NA 132 (L) 02/25/2023 0432   K 3.5 02/25/2023 0432   CL 101 02/25/2023 0432   CO2 22 02/25/2023 0432   GLUCOSE 185 (H) 02/25/2023 0432   BUN 11 02/25/2023 0432   CREATININE 1.34 (H) 02/25/2023 0432   CALCIUM 9.0 02/25/2023 0432   PROT 7.2 02/24/2023 0154   ALBUMIN 3.6 02/24/2023 0154   AST 17 02/24/2023 0154   ALT 14 02/24/2023 0154   ALKPHOS 35 (L) 02/24/2023 0154   BILITOT 1.1 02/24/2023 0154   GFRNONAA 41 (L) 02/25/2023 0432   GFRAA 63 (L) 11/27/2012 0915    Lipid Panel  No results found for: "CHOL", "TRIG", "HDL", "CHOLHDL", "VLDL", "LDLCALC", "LDLDIRECT"  Lab Results  Component Value Date   HGBA1C 7.1 (H) 02/23/2023     Imaging I have reviewed images in epic and the results pertinent to this consultation are:  CT-scan of the brain-no acute abnormality.  Aspects 10 CT a head and neck-no LVO  Routine EEG  7/4: This study is suggestive of severe diffuse encephalopathy, nonspecific etiology. No seizures or epileptiform discharges were seen throughout the recording.   LTM 7/4: This study is suggestive of severe diffuse encephalopathy, nonspecific etiology. No seizures or epileptiform discharges were seen throughout the recording. LTM 7/5: This study was initially suggestive of severe diffuse encephalopathy, nonspecific etiology. Gradually EEG improved and was suggestive of  mild to moderate diffuse encephalopathy. No seizures or epileptiform discharges were seen throughout the recording    Assessment:  77 y.o. female with PMH significant for GERD, DM2, HTN, HLD who was last seen normal at dinner 7/2 around 1900. In the middle of the night, fell next to bed and sat for 2 hours before calling for her son to help.  While in the ED, she had a GTC seizure. Nu further seizures seen on LTM monitoring. Improved neuro exam today. Patient's son is concerned that she may have taken some of her other son's medications (seroquel/xanax) as they are kept close to her medicines. Patient herself denies taking any other medicines, but may have taken these by accident and just not be aware. UDS was negative on admission. Seizures are a rare side effect in seroquel overdose, so this could be a possible reason for her seizure and for her altered mental status.   Recommendations: - discontinue LTM - Sed Rate, RPR, Phos, LFTs ordered - IVF/Encourage hydration  Pt seen by Neuro NP/APP and later by MD. Note/plan to be edited by MD as needed.    Lynnae January, DNP, AGACNP-BC [facility] Please use AMION for contact information & EPIC for messaging.   Attending Neurohospitalist Addendum Patient seen and examined with APP/Resident. Agree with the history and physical as documented above. Agree with the plan as documented, which I helped formulate. I have edited the note above to reflect my full findings and recommendations. I have independently reviewed the chart, obtained history, review of systems and examined the patient.I have personally reviewed pertinent head/neck/spine imaging (CT/MRI). Please feel free to call with any questions.  On my exam patient's h/a has resolved and she is bright, alert, and fully oriented. Aphasia nearly resolved. Best friend at bedside states patient is at baseline. Neurology to be available prn.  -- Bing Neighbors, MD [facility] [phone]  If 7pm- 7am, please page neurology on call as listed in AMION.

## 2023-02-25 NOTE — TOC Initial Note (Signed)
Transition of Care [facility]) - Initial/Assessment Note    Patient Details  Name: Janice Payne MRN: [id number] Date of Birth: [birth year]  Transition of Care [facility]) CM/SW Contact:    Mearl Latin, LCSW Phone Number: 02/25/2023, 3:03 PM  Clinical Narrative:                 CSW received consult for possible SNF at time of discharge. CSW spoke with patient and her son, Arlys John at bedside. Patient reported that she does not want to go to SNF to do the rehab and wants to go home with home health services. Patient's son is in agreement with plan and stated she has two sons that live with her to help. CSW discussed equipment and patient does not have any DME at home so will need some at discharge if it is covered by insurance.  CSW confirmed PCP and address. No further questions reported at this time.    Expected Discharge Plan: Home w Home Health Services Barriers to Discharge: Continued Medical Work up   Patient Goals and CMS Choice Patient states their goals for this hospitalization and ongoing recovery are:: Return home CMS Medicare.gov Compare Post Acute Care list provided to:: Patient Choice offered to / list presented to : Patient, Adult Children [place] ownership interest in [facility].provided to:: Adult Children    Expected Discharge Plan and Services In-house Referral: Clinical Social Work Discharge Planning Services: CM Consult Post Acute Care Choice: Durable Medical Equipment, Home Health Living arrangements for the past 2 months: Single Family Home                                      Prior Living Arrangements/Services Living arrangements for the past 2 months: Single Family Home Lives with:: Adult Children Patient language and need for interpreter reviewed:: Yes Do you feel safe going back to the place where you live?: Yes      Need for Family Participation in Patient Care: Yes (Comment) Care giver support system in place?: Yes (comment)   Criminal  Activity/Legal Involvement Pertinent to Current Situation/Hospitalization: No - Comment as needed  Activities of Daily Living      Permission Sought/Granted Permission sought to share information with : Facility Medical sales representative, Family Supports Permission granted to share information with : Yes, Verbal Permission Granted  Share Information with NAME: Arlys John  Permission granted to share info w AGENCY: HH  Permission granted to share info w Relationship: Son  Permission granted to share info w Contact Information: [phone]  Emotional Assessment Appearance:: Appears stated age Attitude/Demeanor/Rapport: Engaged Affect (typically observed): Accepting, Appropriate Orientation: : Oriented to Self, Oriented to Place, Oriented to  Time, Oriented to Situation Alcohol / Substance Use: Not Applicable Psych Involvement: No (comment)  Admission diagnosis:  Seizure (HCC) [R56.9] Weakness [R53.1] New onset seizure with abnormal neurological exam without head trauma (HCC) [R56.9, R29.90] Patient Active Problem List   Diagnosis Date Noted   Thyroid nodule 02/24/2023   Acute metabolic encephalopathy 02/24/2023   New onset seizure with abnormal neurological exam without head trauma (HCC) 02/23/2023   Mechanical fall-fall from bed 02/23/2023   Acute GI bleeding 03/31/2021   Hematochezia 03/29/2021   Colon perforation (HCC) 03/17/2021   S/P exploratory laparotomy 03/16/2021   Severe obesity (BMI 35.0-39.9) with comorbidity (HCC) 03/06/2021   Peripheral autonomic neuropathy due to secondary diabetes (HCC) 12/06/2018   GERD with esophagitis 10/25/2018  Chronic daily headache 01/21/2016   Heart murmur, systolic 04/15/2015   Allergic rhinitis 10/02/2014   Post-operative state 12/07/2013   Papilloma of breast 10/08/2013   CKD stage 3b, GFR 30-44 ml/min (HCC) 05/08/2013   Nephrolithiasis 11/03/2012   Generalized anxiety disorder 01/01/2011   Mixed hyperlipidemia 08/28/2010   DM  (diabetes mellitus) type II controlled with renal manifestation (HCC) 07/29/2010   Vitamin D deficiency 06/18/2010   Essential hypertension 05/06/2010   GERD (gastroesophageal reflux disease) 03/05/2010   PCP:  Rebecka Apley, NP Pharmacy:   [facility] [address], [place] - [address] [place] [address] Phone: [phone] Fax: [phone]  [facility] [address] [place] [address] Phone: [phone] Fax: [phone]     Social Determinants of Health (SDOH) Social History: SDOH Screenings   Housing: Patient Unable To Answer (02/24/2023)  Tobacco Use: Low Risk  (02/23/2023)   SDOH Interventions:     Readmission Risk Interventions     No data to display

## 2023-02-26 DIAGNOSIS — R2990 Unspecified symptoms and signs involving the nervous system: Secondary | ICD-10-CM | POA: Diagnosis not present

## 2023-02-26 DIAGNOSIS — R569 Unspecified convulsions: Secondary | ICD-10-CM | POA: Diagnosis not present

## 2023-02-26 LAB — BASIC METABOLIC PANEL
Anion gap: 10 (ref 5–15)
BUN: 17 mg/dL (ref 8–23)
CO2: 22 mmol/L (ref 22–32)
Calcium: 9 mg/dL (ref 8.9–10.3)
Chloride: 100 mmol/L (ref 98–111)
Creatinine, Ser: 1.63 mg/dL — ABNORMAL HIGH (ref 0.44–1.00)
GFR, Estimated: 32 mL/min — ABNORMAL LOW (ref >60)
Glucose, Bld: 186 mg/dL — ABNORMAL HIGH (ref 70–99)
Potassium: 3.6 mmol/L (ref 3.5–5.1)
Sodium: 132 mmol/L — ABNORMAL LOW (ref 135–145)

## 2023-02-26 LAB — CBC WITH DIFFERENTIAL/PLATELET
Abs Immature Granulocytes: 0.09 10*3/uL — ABNORMAL HIGH (ref 0.00–0.07)
Basophils Absolute: 0.1 10*3/uL (ref 0.0–0.1)
Basophils Relative: 1 %
Eosinophils Absolute: 0.2 10*3/uL (ref 0.0–0.5)
Eosinophils Relative: 2 %
HCT: 35.3 % — ABNORMAL LOW (ref 36.0–46.0)
Hemoglobin: 11.7 g/dL — ABNORMAL LOW (ref 12.0–15.0)
Immature Granulocytes: 1 %
Lymphocytes Relative: 15 %
Lymphs Abs: 1.1 10*3/uL (ref 0.7–4.0)
MCH: 28.5 pg (ref 26.0–34.0)
MCHC: 33.1 g/dL (ref 30.0–36.0)
MCV: 85.9 fL (ref 80.0–100.0)
Monocytes Absolute: 0.6 10*3/uL (ref 0.1–1.0)
Monocytes Relative: 8 %
Neutro Abs: 5.4 10*3/uL (ref 1.7–7.7)
Neutrophils Relative %: 73 %
Platelets: 151 10*3/uL (ref 150–400)
RBC: 4.11 MIL/uL (ref 3.87–5.11)
RDW: 12.7 % (ref 11.5–15.5)
WBC: 7.4 10*3/uL (ref 4.0–10.5)
nRBC: 0 % (ref 0.0–0.2)

## 2023-02-26 LAB — MAGNESIUM: Magnesium: 1.9 mg/dL (ref 1.7–2.4)

## 2023-02-26 LAB — GLUCOSE, CAPILLARY
Glucose-Capillary: 167 mg/dL — ABNORMAL HIGH (ref 70–99)
Glucose-Capillary: 164 mg/dL — ABNORMAL HIGH (ref 70–99)
Glucose-Capillary: 152 mg/dL — ABNORMAL HIGH (ref 70–99)
Glucose-Capillary: 199 mg/dL — ABNORMAL HIGH (ref 70–99)

## 2023-02-26 LAB — PROCALCITONIN: Procalcitonin: <0.1 ng/mL

## 2023-02-26 LAB — RPR: RPR Ser Ql: NONREACTIVE

## 2023-02-26 LAB — C-REACTIVE PROTEIN: CRP: 2.3 mg/dL — ABNORMAL HIGH (ref <1.0)

## 2023-02-26 LAB — BRAIN NATRIURETIC PEPTIDE: B Natriuretic Peptide: 80.5 pg/mL (ref 0.0–100.0)

## 2023-02-26 LAB — AMMONIA: Ammonia: 27 umol/L (ref 9–35)

## 2023-02-26 MED ORDER — LORATADINE 10 MG PO TABS
10.0000 mg | ORAL_TABLET | Freq: Every day | ORAL | Status: DC
  Administered 2023-02-26 – 2023-02-27 (×2): 10 mg via ORAL
  Filled 2023-02-26 (×2): qty 1

## 2023-02-26 MED ORDER — SODIUM CHLORIDE 0.9 % IV SOLN
1.0000 g | INTRAVENOUS | Status: DC
  Administered 2023-02-26 – 2023-02-27 (×2): 1 g via INTRAVENOUS
  Filled 2023-02-26 (×2): qty 10

## 2023-02-26 MED ORDER — LEVOFLOXACIN 500 MG PO TABS
500.0000 mg | ORAL_TABLET | Freq: Every day | ORAL | Status: DC
  Filled 2023-02-26: qty 1

## 2023-02-26 MED ORDER — VITAMIN B-12 1000 MCG PO TABS: 1000.0000 ug | ORAL_TABLET | Freq: Every day | ORAL | Status: DC

## 2023-02-26 MED ORDER — ZOLPIDEM TARTRATE 5 MG PO TABS
5.0000 mg | ORAL_TABLET | Freq: Every evening | ORAL | Status: DC | PRN
  Administered 2023-02-26: 5 mg via ORAL
  Filled 2023-02-26: qty 1

## 2023-02-26 MED ORDER — SERTRALINE HCL 50 MG PO TABS
50.0000 mg | ORAL_TABLET | Freq: Every day | ORAL | Status: DC
  Administered 2023-02-26 – 2023-02-27 (×2): 50 mg via ORAL
  Filled 2023-02-26 (×2): qty 1

## 2023-02-26 MED ORDER — METRONIDAZOLE 500 MG PO TABS: 500.0000 mg | ORAL_TABLET | Freq: Three times a day (TID) | ORAL | Status: DC

## 2023-02-26 MED ORDER — CYANOCOBALAMIN 1000 MCG/ML IJ SOLN
1000.0000 ug | Freq: Every day | INTRAMUSCULAR | Status: DC
  Administered 2023-02-26 – 2023-02-27 (×2): 1000 ug via SUBCUTANEOUS
  Filled 2023-02-26 (×2): qty 1

## 2023-02-26 MED ORDER — METRONIDAZOLE 500 MG PO TABS
500.0000 mg | ORAL_TABLET | Freq: Three times a day (TID) | ORAL | Status: DC
  Administered 2023-02-26 – 2023-02-27 (×4): 500 mg via ORAL
  Filled 2023-02-26 (×4): qty 1

## 2023-02-26 NOTE — Progress Notes (Signed)
PROGRESS NOTE                                                                                                                                                                                                             Patient Demographics:    Janice Payne, is a 77 y.o. female, DOB - 1946/06/19, ZOX:[id number]  Outpatient Primary MD for the patient is Hemberg, Ruby Cola, NP    LOS - 3  Admit date - 02/23/2023    Chief Complaint  Patient presents with   Fall   Weakness       Brief Narrative (HPI from H&P)   77 year old female medical history of hypertension, diabetes mellitus type 2, hyperlipidemia, GERD, who presented to emergency department with complaint of unable to speak fluently.  In the ER she had an episode of seizure, she got progressively confused and she was admitted to the hospital for further care.  Neurology was consulted.   Subjective:   Patient in bed appears to be in no distress denies any headache chest or abdominal pain, no shortness of breath, mild productive cough.   Assessment  & Plan :   Metabolic encephalopathy, new seizure, postictal state.  Neurology on board, CT head, MRI brain and EEG nonacute, per neurology received dose of Keppra in the ER. she did have a mild UTI for which she is being treated with Rocephin, overall clinically much improved on 02/25/2023 and mentation getting close to normal.  Continue to monitor defer further management of neurological issues to neurology which is on board.  Mechanical fall.  Head CT negative, CTA negative, x-ray pelvis stable, currently unable to provide any review of systems due to postictal state and confusion, advance activity PT OT and monitor.  UTI present on admission.  Rocephin x 3 days, monitor urine cultures and monitor bladder scans as well.    Mild bronchitis versus aspiration pneumonia.  Has developed some productive cough on 02/26/2023, short  trial of antibiotics i.e. Levaquin and Flagyl as she has penicillin allergy, flutter valve, encouraged to sit in chair.  Seen and cleared by speech.  No signs of ongoing aspiration, likely happened when she seized.    Mild hyponatremia.  Monitor.  Hypokalemia.  Replaced.  Thyroid nodule.  PCP to arrange for outpatient thyroid ultrasound postdischarge.    Low B12.  Supplement.  Borderline elevation in ammonia levels.  Low-dose lactulose.    Essential hypertension.  For now as needed hydralazine.  CKD 3B.  creatinine baseline 1.6.  Monitor.  GERD.  IV PPI:  DM type II.  For now sliding scale.  Lab Results  Component Value Date   HGBA1C 7.1 (H) 02/23/2023   CBG (last 3)  Recent Labs    02/25/23 1548 02/25/23 2131 02/26/23 0729  GLUCAP 167* 174* 167*        Condition - Extremely Guarded  Family Communication  : Jolaine Artist bedside on 02/24/2023, over the phone [phone]   on 02/25/2023  Code Status : Full code  Consults  : Neurology  PUD Prophylaxis : PPI   Procedures  :     CT - 1. No emergent large vessel occlusion or hemodynamically significant stenosis of the head or neck. 2. 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 3. Incidental thyroid nodule measuring 1.5 cm. Recommend non-emergent thyroid ultrasound  EEG - Non specific changes  MRI - motion artifact but no acute CVA      Disposition Plan  :    Status is: Inpatient  DVT Prophylaxis  :    Place and maintain sequential compression device Start: 02/24/23 0808 SCDs Start: 02/23/23 2141    Lab Results  Component Value Date   PLT 151 02/26/2023    Diet :  Diet Order             DIET SOFT Fluid consistency: Thin  Diet effective now                    Inpatient Medications  Scheduled Meds:  chlorhexidine gluconate (MEDLINE KIT)  15 mL Mouth Rinse BID   cyanocobalamin  1,000 mcg Subcutaneous Daily   [START ON 03/01/2023] vitamin B-12  1,000 mcg Oral Daily    insulin aspart  0-9 Units Subcutaneous TID WC   lactulose  20 g Oral BID   pantoprazole  40 mg Oral BID AC   Continuous Infusions:  cefTRIAXone (ROCEPHIN)  IV 1 g (02/25/23 1431)   PRN Meds:.acetaminophen **OR** acetaminophen, haloperidol lactate, hydrALAZINE, LORazepam, [DISCONTINUED] ondansetron **OR** ondansetron (ZOFRAN) IV, mouth rinse  Antibiotics  :    Anti-infectives (From admission, onward)    Start     Dose/Rate Route Frequency Ordered Stop   02/24/23 1515  cefTRIAXone (ROCEPHIN) 1 g in sodium chloride 0.9 % 100 mL IVPB        1 g 200 mL/hr over 30 Minutes Intravenous Every 24 hours 02/24/23 1428 03/01/23 1514         Objective:   Vitals:   02/25/23 1549 02/25/23 2000 02/26/23 0000 02/26/23 0400  BP: 137/66 133/69 135/69 (!) 152/77  Pulse: 80 85 84 92  Resp: (!) 24 17 19 19   Temp: 97.8 F (36.6 C) 98.5 F (36.9 C) 98.2 F (36.8 C) 98 F (36.7 C)  TempSrc: Oral Oral Oral Oral  SpO2: 98% 99% 96% 96%  Weight:      Height:        Wt Readings from Last 3 Encounters:  02/23/23 107.5 kg  03/30/21 107.5 kg  01/02/21 107.5 kg     Intake/Output Summary (Last 24 hours) at 02/26/2023 0739 Last data filed at 02/25/2023 1800 Gross per 24 hour  Intake 252.87 ml  Output 950 ml  Net -697.13 ml     Physical Exam  Awake Alert, No new F.N deficits, Normal affect [place].AT,PERRAL Supple Neck, No JVD,  Symmetrical Chest wall movement, Good air movement bilaterally, CTAB RRR,No Gallops,Rubs or new Murmurs,  +ve B.Sounds, Abd Soft, No tenderness,   No Cyanosis, Clubbing or edema     Data Review:    Recent Labs  Lab 02/23/23 1859 02/23/23 1932 02/24/23 0154 02/25/23 0432 02/26/23 0310  WBC 8.3  --  9.1 9.0 7.4  HGB 12.5 12.6 12.9 11.7* 11.7*  HCT 37.2 37.0 41.3 35.5* 35.3*  PLT 162  --  138* 151 151  MCV 86.1  --  87.7 83.9 85.9  MCH 28.9  --  27.4 27.7 28.5  MCHC 33.6  --  31.2 33.0 33.1  RDW 12.5  --  12.4 12.5 12.7  LYMPHSABS 1.4  --   --  1.2 1.1   MONOABS 0.5  --   --  0.7 0.6  EOSABS 0.0  --   --  0.0 0.2  BASOSABS 0.0  --   --  0.0 0.1    Recent Labs  Lab 02/23/23 1859 02/23/23 1932 02/24/23 0154 02/24/23 1220 02/25/23 0432 02/26/23 0310  NA 132* 135 134*  --  132* 132*  K 3.5 3.6 3.4*  --  3.5 3.6  CL 97* 99 98  --  101 100  CO2 24  --  19*  --  22 22  ANIONGAP 11  --  17*  --  9 10  GLUCOSE 129* 124* 181*  --  185* 186*  BUN 14 15 15   --  11 17  CREATININE 1.52* 1.60* 1.50*  --  1.34* 1.63*  AST 16  --  17  --  21  --   ALT 14  --  14  --  12  --   ALKPHOS 34*  --  35*  --  39  --   BILITOT 1.0  --  1.1  --  0.8  --   ALBUMIN 3.5  --  3.6  --  3.3*  --   CRP  --   --   --  1.3* 4.9* 2.3*  PROCALCITON  --   --   --  <0.10 <0.10 <0.10  INR 1.0  --   --   --   --   --   TSH  --   --  1.085  --   --   --   HGBA1C 7.1*  --   --   --   --   --   AMMONIA  --   --   --  23 44* 27  BNP  --   --   --   --  128.9* 80.5  MG 1.8  --   --   --  1.8 1.9  CALCIUM 9.2  --  9.4  --  9.0 9.0      Recent Labs  Lab 02/23/23 1859 02/24/23 0154 02/24/23 1220 02/25/23 0432 02/26/23 0310  CRP  --   --  1.3* 4.9* 2.3*  PROCALCITON  --   --  <0.10 <0.10 <0.10  INR 1.0  --   --   --   --   TSH  --  1.085  --   --   --   HGBA1C 7.1*  --   --   --   --   AMMONIA  --   --  23 44* 27  BNP  --   --   --  128.9* 80.5  MG 1.8  --   --  1.8 1.9  CALCIUM 9.2 9.4  --  9.0 9.0    Lab Results  Component Value Date   HGBA1C 7.1 (H) 02/23/2023    Recent Labs    02/24/23 0154  TSH 1.085      Radiology Reports CT HEAD WO CONTRAST ( )  Result Date: 02/25/2023 CLINICAL DATA:  Provided history: Headache, new onset. EXAM: CT HEAD WITHOUT CONTRAST TECHNIQUE: Contiguous axial images were obtained from the base of the skull through the vertex without intravenous contrast. RADIATION DOSE REDUCTION: This exam was performed according to the departmental dose-optimization program which includes automated exposure control, adjustment of  the mA and/or kV according to patient size and/or use of iterative reconstruction technique. COMPARISON:  Brain MRI 02/24/2023. Non-contrast head CT and CT angiogram head/neck 02/23/2023. FINDINGS: Brain: No age advanced or lobar predominant parenchymal atrophy. Patchy and ill-defined hypoattenuation within the cerebral white matter, nonspecific but compatible with mild chronic small vessel ischemic disease. There is no acute intracranial hemorrhage. No demarcated cortical infarct. No extra-axial fluid collection. No evidence of an intracranial mass. No midline shift. Vascular: No hyperdense vessel.  Atherosclerotic calcifications. Skull: No calvarial fracture or aggressive osseous lesion. Sinuses/Orbits: No mass or acute finding within the imaged orbits. Mild mucosal thickening within the right maxillary, left sphenoid and right ethmoid sinuses. IMPRESSION: 1.  No evidence of an acute intracranial abnormality. 2. Mild chronic small vessel ischemic changes within the cerebral white matter. 3. Mild paranasal sinus mucosal thickening. Electronically Signed   By: Jackey Loge D.O.   On: 02/25/2023 10:53   Overnight EEG with video  Result Date: 02/25/2023 Charlsie Quest, MD     02/25/2023 12:54 PM Patient Name: Janice Payne MRN: [id number] Epilepsy Attending: Charlsie Quest Referring Physician/Provider: Mathews Argyle, NP Duration: 02/24/2023 1722 to 02/25/2023 1116  Patient history: 77yo F with ams getting eeg to evaluate for seizure.  Level of alertness:  lethargic-->awake, asleep  AEDs during EEG study: None  Technical aspects: This EEG study was done with scalp electrodes positioned according to the 10-20 International system of electrode placement. Electrical activity was reviewed with band pass filter of 1-70Hz , sensitivity of 7 uV/mm, display speed of 67mm/sec with a 60Hz  notched filter applied as appropriate. EEG data were recorded continuously and digitally stored.  Video monitoring was available and  reviewed as appropriate.  Description: At the beginning of the study, EEG showed continuous generalized 3-6hz  theta-delta slowing. Gradually EEG improved and showed posterior dominant rhythm of 8 Hz activity of moderate voltage (25-35 uV) seen predominantly in posterior head regions, symmetric and reactive to eye opening and eye closing. Sleep was characterized by vertex waves, sleep spindles (12-14 Hz), maximal fronto-central region. Intermittent generalized 3-6Hz  theta-delta slowing. Hyperventilation and photic stimulation were not performed.    ABNORMALITY - Continuous slow, generalized  IMPRESSION: This study was initially suggestive of severe diffuse encephalopathy, nonspecific etiology. Gradually EEG improved and was suggestive of mild to moderate diffuse encephalopathy. No seizures or epileptiform discharges were seen throughout the recording.  Charlsie Quest   DG Chest Port 1 View  Result Date: 02/25/2023 CLINICAL DATA:  Provided history: Shortness of breath.  Nausea. EXAM: PORTABLE CHEST 1 VIEW COMPARISON:  Prior chest radiographs 02/23/2023 and earlier. FINDINGS: Heart size within normal limits. Aortic atherosclerosis. Mild ill-defined opacity within the medial right lung base. Small focus of linear atelectasis within the lateral left lung base. No evidence of pleural effusion or pneumothorax. No acute osseous abnormality identified. IMPRESSION: 1. Mild ill-defined opacity within the right lung base. The appearance favors atelectasis.  However, exclude signs/symptoms that would suggest pneumonia. 2. Small focus of linear atelectasis within the left lung base. 3. Aortic Atherosclerosis (ICD10-I70.0). Electronically Signed   By: Jackey Loge D.O.   On: 02/25/2023 08:18   DG Abd Portable 1V  Result Date: 02/25/2023 CLINICAL DATA:  Shortness of breath, nausea EXAM: PORTABLE ABDOMEN - 1 VIEW COMPARISON:  KUB 03/18/2021 FINDINGS: There is a paucity of bowel gas throughout the abdomen without evidence of  mechanical obstruction. There is no definite free intraperitoneal air, within the confines of supine technique. There are no appreciable soft tissue calcifications. There is no acute osseous abnormality. IMPRESSION: Unremarkable KUB. Electronically Signed   By: Lesia Hausen M.D.   On: 02/25/2023 08:17   MR BRAIN WO CONTRAST  Result Date: 02/24/2023 CLINICAL DATA:  Neuro deficit with stroke suspected. EXAM: MRI HEAD WITHOUT CONTRAST TECHNIQUE: Multiplanar, multiecho pulse sequences of the brain and surrounding structures were obtained without intravenous contrast. COMPARISON:  Head CT and CTA from yesterday. FINDINGS: Very truncated and motion degraded study, only diffusion imaging and nondiagnostic sagittal T1 weighted sequence were obtained. The axial diffusion imaging is diagnostic and negative for acute infarct. No hydrocephalus or midline shift. IMPRESSION: Truncated and motion degraded study but diagnostic diffusion imaging which is negative for acute infarct. Electronically Signed   By: Tiburcio Pea M.D.   On: 02/24/2023 12:03   Overnight EEG with video  Result Date: 02/24/2023 Charlsie Quest, MD     02/24/2023  8:32 AM Patient Name: Janice Payne MRN: [id number] Epilepsy Attending: Charlsie Quest Referring Physician/Provider: Erick Blinks, MD Duration: 02/24/2023 0044 to 02/24/2023 4098 Patient history: 77yo F with ams getting eeg to evaluate for seizure. Level of alertness:  lethargic AEDs during EEG study: None Technical aspects: This EEG study was done with scalp electrodes positioned according to the 10-20 International system of electrode placement. Electrical activity was reviewed with band pass filter of 1-70Hz , sensitivity of 7 uV/mm, display speed of 45mm/sec with a 60Hz  notched filter applied as appropriate. EEG data were recorded continuously and digitally stored.  Video monitoring was available and reviewed as appropriate. Description: EEG showed continuous generalized rhythmic  2-3hz  delta slowing. Hyperventilation and photic stimulation were not performed.   ABNORMALITY - Continuous slow, generalized IMPRESSION: This study is suggestive of severe diffuse encephalopathy, nonspecific etiology. No seizures or epileptiform discharges were seen throughout the recording. Charlsie Quest   EEG adult now  Result Date: 02/24/2023 Charlsie Quest, MD     02/24/2023  8:34 AM Patient Name: ZALIE JERSEY MRN: [id number] Epilepsy Attending: Charlsie Quest Referring Physician/Provider: Tereasa Coop, MD Date: 02/23/2023 Duration: 24.03 mins Patient history:  77yo F with ams getting eeg to evaluate for seizure. Level of alertness:  lethargic AEDs during EEG study: None Technical aspects: This EEG study was done with scalp electrodes positioned according to the 10-20 International system of electrode placement. Electrical activity was reviewed with band pass filter of 1-70Hz , sensitivity of 7 uV/mm, display speed of 88mm/sec with a 60Hz  notched filter applied as appropriate. EEG data were recorded continuously and digitally stored.  Video monitoring was available and reviewed as appropriate. Description: EEG showed continuous generalized rhythmic 2-3hz  delta slowing. Hyperventilation and photic stimulation were not performed.    ABNORMALITY - Continuous slow, generalized  IMPRESSION: This study is suggestive of severe diffuse encephalopathy, nonspecific etiology. No seizures or epileptiform discharges were seen throughout the recording.  Charlsie Quest    DG Pelvis Portable  Result Date:  02/23/2023 CLINICAL DATA:  Weakness EXAM: PORTABLE PELVIS 1-2 VIEWS COMPARISON:  CT abdomen and pelvis 03/29/2021 FINDINGS: Thin band of sclerosis about the right femoral head neck junction is favored projectional. If there is concern for acute fracture, CT is recommended. No dislocation. Degenerative changes pubic symphysis, both hips, SI joints and lower lumbar spine. Contrast within the bladder.  IMPRESSION: Thin band of sclerosis about the right femoral head neck junction is favored projectional. If there is concern for acute fracture, CT is recommended. Electronically Signed   By: Minerva Fester M.D.   On: 02/23/2023 22:24   DG Chest Port 1 View  Result Date: 02/23/2023 CLINICAL DATA:  Weakness. EXAM: PORTABLE CHEST 1 VIEW COMPARISON:  Chest radiograph dated 01/02/2021. FINDINGS: No focal consolidation, pleural effusion, or pneumothorax. The cardiac silhouette is within normal limits. No acute osseous pathology. IMPRESSION: No active disease. Electronically Signed   By: Elgie Collard M.D.   On: 02/23/2023 20:57   CT ANGIO HEAD NECK W WO CM (CODE STROKE)  Result Date: 02/23/2023 CLINICAL DATA:  Left facial droop EXAM: CT ANGIOGRAPHY HEAD AND NECK WITH AND WITHOUT CONTRAST TECHNIQUE: Multidetector CT imaging of the head and neck was performed using the standard protocol during bolus administration of intravenous contrast. Multiplanar CT image reconstructions and MIPs were obtained to evaluate the vascular anatomy. Carotid stenosis measurements (when applicable) are obtained utilizing NASCET criteria, using the distal internal carotid diameter as the denominator. RADIATION DOSE REDUCTION: This exam was performed according to the departmental dose-optimization program which includes automated exposure control, adjustment of the mA and/or kV according to patient size and/or use of iterative reconstruction technique. CONTRAST:  75mL OMNIPAQUE IOHEXOL 350 MG/ML SOLN COMPARISON:  None Available. FINDINGS: CTA NECK FINDINGS SKELETON: There is no bony spinal canal stenosis. No lytic or blastic lesion. OTHER NECK: Multiple bilateral thyroid nodules measuring up to 1.5 cm. 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 AORTIC ARCH: There is calcific atherosclerosis of the aortic arch. Conventional 3 vessel aortic branching pattern. RIGHT CAROTID SYSTEM: Normal  without aneurysm, dissection or stenosis. LEFT CAROTID SYSTEM: Normal without aneurysm, dissection or stenosis. VERTEBRAL ARTERIES: Left dominant configuration.There is no dissection, occlusion or flow-limiting stenosis to the skull base (V1-V3 segments). CTA HEAD FINDINGS POSTERIOR CIRCULATION: --Vertebral arteries: Normal V4 segments. --Inferior cerebellar arteries: Normal. --Basilar artery: Normal. --Superior cerebellar arteries: Normal. --Posterior cerebral arteries (PCA): Normal. ANTERIOR CIRCULATION: --Intracranial internal carotid arteries: Atherosclerotic calcification of the internal carotid arteries at the skull base without hemodynamically significant stenosis. --Anterior cerebral arteries (ACA): Normal. Both A1 segments are present. Patent anterior communicating artery (a-comm). --Middle cerebral arteries (MCA): Normal. VENOUS SINUSES: As permitted by contrast timing, patent. ANATOMIC VARIANTS: None Review of the MIP images confirms the above findings. IMPRESSION: 1. No emergent large vessel occlusion or hemodynamically significant stenosis of the head or neck. 2. 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 3. Incidental thyroid nodule measuring 1.5 cm. Recommend non-emergent thyroid ultrasound. Reference: J Am Coll Radiol. 2015 Feb;12(2): 143-50 Aortic Atherosclerosis (ICD10-I70.0). Electronically Signed   By: Deatra Robinson M.D.   On: 02/23/2023 19:31   CT HEAD CODE STROKE WO CONTRAST  Result Date: 02/23/2023 CLINICAL DATA:  Code stroke. Acute neurologic deficit. Left facial droop EXAM: CT HEAD WITHOUT CONTRAST TECHNIQUE: Contiguous axial images were obtained from the base of the skull through the vertex without intravenous contrast. RADIATION DOSE REDUCTION: This exam was performed according to the departmental dose-optimization program which includes automated exposure  control, adjustment of the mA and/or kV according to patient size and/or use of iterative reconstruction  technique. COMPARISON:  None Available. FINDINGS: Brain: There is no mass, hemorrhage or extra-axial collection. The size and configuration of the ventricles and extra-axial CSF spaces are normal. The brain parenchyma is normal, without evidence of acute or chronic infarction. Vascular: No abnormal hyperdensity of the major intracranial arteries or dural venous sinuses. No intracranial atherosclerosis. Skull: The visualized skull base, calvarium and extracranial soft tissues are normal. Sinuses/Orbits: No fluid levels or advanced mucosal thickening of the visualized paranasal sinuses. No mastoid or middle ear effusion. The orbits are normal. ASPECTS [facility] Stroke Program Early CT Score) - Ganglionic level infarction (caudate, lentiform nuclei, internal capsule, insula, M1-M3 cortex): 7 - Supraganglionic infarction (M4-M6 cortex): 3 Total score (0-10 with 10 being normal): 10 IMPRESSION: 1. No acute intracranial abnormality. 2. ASPECTS is 10. These results were communicated to Dr. Erick Blinks at 7:23 pm on 02/23/2023 by text page via the [facility] messaging system. Electronically Signed   By: Deatra Robinson M.D.   On: 02/23/2023 19:23      Signature  -   Susa Raring M.D on 02/26/2023 at 7:39 AM   -  To page go to [email or web address]

## 2023-02-27 DIAGNOSIS — R569 Unspecified convulsions: Secondary | ICD-10-CM | POA: Diagnosis not present

## 2023-02-27 DIAGNOSIS — R2990 Unspecified symptoms and signs involving the nervous system: Secondary | ICD-10-CM | POA: Diagnosis not present

## 2023-02-27 LAB — GLUCOSE, CAPILLARY: Glucose-Capillary: 190 mg/dL — ABNORMAL HIGH (ref 70–99)

## 2023-02-27 MED ORDER — CEPHALEXIN 500 MG PO CAPS: 500.0000 mg | ORAL_CAPSULE | Freq: Three times a day (TID) | ORAL | 0 refills | Status: AC

## 2023-02-27 MED ORDER — METRONIDAZOLE 500 MG PO TABS: 500.0000 mg | ORAL_TABLET | Freq: Two times a day (BID) | ORAL | 0 refills | Status: AC

## 2023-02-27 MED ORDER — ONDANSETRON HCL 4 MG PO TABS: 4.0000 mg | ORAL_TABLET | Freq: Three times a day (TID) | ORAL | 0 refills | Status: AC | PRN

## 2023-02-27 MED ORDER — CYANOCOBALAMIN 1000 MCG PO TABS: 1000.0000 ug | ORAL_TABLET | Freq: Every day | ORAL | 0 refills | Status: AC

## 2023-02-27 NOTE — Discharge Summary (Signed)
Janice Payne MWU:[id number] DOB: [birth year] DOA: 02/23/2023  PCP: Rebecka Apley, NP  Admit date: 02/23/2023  Discharge date: 02/27/2023  Admitted From: Home   Disposition:  Home, refused SNF   Recommendations for Outpatient Follow-up:   Follow up with PCP in 1-2 weeks  PCP Please obtain BMP/CBC, 2 view CXR in 1week,  (see Discharge instructions)   PCP Please follow up on the following pending results: Monitor BMP, ammonia levels, magnesium, B12, CBC along with two-view chest x-ray.  Needs outpatient follow-up with neurology.  Kindly arrange for outpatient thyroid ultrasound.   Home Health: PT, RN, aide if she qualifies Equipment/Devices: As below Consultations: Neurology Discharge Condition: Stable    CODE STATUS: Full    Diet Recommendation: Heart Healthy Low Carb     Chief Complaint  Patient presents with   Fall   Weakness     Brief history of present illness from the day of admission and additional interim summary    77 year old female medical history of hypertension, diabetes mellitus type 2, hyperlipidemia, GERD, who presented to emergency department with complaint of unable to speak fluently. In the ER she had an episode of seizure, she got progressively confused and she was admitted to the hospital for further care. Neurology was consulted.                                                                  Hospital Course   Metabolic encephalopathy, new seizure, postictal state.  Neurology on board, CT head, MRI brain and EEG nonacute, per neurology received dose of Keppra in the ER. she did have a mild UTI for which she was treated with Rocephin, overall clinically much improved on 02/25/2023 and mentation getting close to normal.  Case discussed with neurologist Dr. Selina Cooley no AEDs going forward,  continue outpatient follow-up with PCP and neurology.  Patient is currently symptom-free back to baseline.  Could have had 1 isolated seizure due to underlying dehydration and UTI, PCP to arrange for close outpatient neurology follow-up.  Will get home PT, RN and aide along with a walker upon discharge.  She did qualify for SNF but refused SNF placement and wants to go home.  Family bedside agreeable with the plan.   Mechanical fall.  Head CT negative, CTA negative, x-ray pelvis stable, now back to baseline, worked with PT OT, qualified for SNF but refused.   UTI present on admission.  Finished treatment with Rocephin.   Mild bronchitis versus aspiration pneumonia.  Has developed some productive cough on 02/26/2023, short trial of antibiotics i.e. Levaquin and Flagyl as she has penicillin allergy, flutter valve, encouraged to sit in chair.  Seen and cleared by speech.  No signs of ongoing aspiration, likely happened when she seized.  Will get 3 more days of oral  antibiotics upon discharge PCP to monitor.   Mild hyponatremia.  Monitor.   Hypokalemia.  Replaced.   Thyroid nodule.  PCP to arrange for outpatient thyroid ultrasound postdischarge.     Low B12.  Supplement.     Borderline elevation in ammonia levels.  BMs after lactulose, clinically irrelevant now.   Essential hypertension.  Stable continue home regimen.   CKD 3B.  creatinine baseline 1.6.  Monitor.  Close to baseline.   GERD.  Stable continue home regimen   DM type II.  Continue home regimen.  Discharge diagnosis     Principal Problem:   New onset seizure with abnormal neurological exam without head trauma (HCC) Active Problems:   Acute metabolic encephalopathy   Mechanical fall-fall from bed   CKD stage 3b, GFR 30-44 ml/min (HCC)   GERD (gastroesophageal reflux disease)   Essential hypertension   DM (diabetes mellitus) type II controlled with renal manifestation (HCC)   Thyroid nodule    Discharge instructions     Discharge Instructions     Discharge instructions   Complete by: As directed    Do not drive, operate heavy machinery, perform activities at heights, swimming or participation in water activities or provide baby sitting services until you have seen by Primary MD or a Neurologist and advised to do so again.  Follow with Primary MD Rebecka Apley, NP in 7 days   Get CBC, CMP, ammonia, magnesium, 2 view Chest X ray, you also need outpatient thyroid ultrasound ordered by your PCP, kindly follow-up within a week.   Activity: As tolerated with Full fall precautions use walker/cane & assistance as needed  Disposition Home   Diet: Heart Healthy low carbohydrate diet, check CBGs q. ACH S.  Special Instructions: If you have smoked or chewed Tobacco  in the last 2 yrs please stop smoking, stop any regular Alcohol  and or any Recreational drug use.  On your next visit with your primary care physician please Get Medicines reviewed and adjusted.  Please request your Prim.MD to go over all Hospital Tests and Procedure/Radiological results at the follow up, please get all Hospital records sent to your Prim MD by signing hospital release before you go home.  If you experience worsening of your admission symptoms, develop shortness of breath, life threatening emergency, suicidal or homicidal thoughts you must seek medical attention immediately by calling 911 or calling your MD immediately  if symptoms less severe.  You Must read complete instructions/literature along with all the possible adverse reactions/side effects for all the Medicines you take and that have been prescribed to you. Take any new Medicines after you have completely understood and accpet all the possible adverse reactions/side effects.   Increase activity slowly   Complete by: As directed        Discharge Medications   Allergies as of 02/27/2023       Reactions   Niacin Itching, Rash   Codeine Nausea And Vomiting, Nausea  Only   Niaspan [niacin Er] Nausea And Vomiting   Penicillins Rash        Medication List     STOP taking these medications    oxyCODONE 5 MG immediate release tablet Commonly known as: Oxy IR/ROXICODONE       TAKE these medications    acetaminophen 325 MG tablet Commonly known as: TYLENOL Take 650 mg by mouth every 6 (six) hours as needed for mild pain, fever or headache.   ALPRAZolam 0.5 MG tablet Commonly known as: XANAX Take  0.5 mg by mouth at bedtime as needed for sleep.   BLOOD GLUCOSE TEST STRIPS Strp Test blood sugars twice daily. Dispense strips for Onetouch Ultra 2   cephALEXin 500 MG capsule Commonly known as: KEFLEX Take 1 capsule (500 mg total) by mouth 3 (three) times daily for 3 days.   cetirizine 10 MG tablet Commonly known as: ZYRTEC Take 10 mg by mouth at bedtime.   clotrimazole-betamethasone cream Commonly known as: LOTRISONE Apply 1 application topically 2 (two) times daily as needed (rash/irritation).   cyanocobalamin 1000 MCG tablet Take 1 tablet (1,000 mcg total) by mouth daily. Start taking on: March 01, 2023   fenofibrate 160 MG tablet Take 160 mg by mouth daily before breakfast.   gabapentin 300 MG capsule Commonly known as: NEURONTIN Take 300 mg by mouth 2 (two) times daily.   glucagon 1 MG injection Follow package directions for low blood sugar.   hydrochlorothiazide 12.5 MG tablet Commonly known as: HYDRODIURIL Take 12.5 mg by mouth daily.   hydrocortisone 25 MG suppository Commonly known as: ANUSOL-HC Place 1 suppository (25 mg total) rectally 2 (two) times daily.   Insulin Pen Needle 31G X 6 MM Misc See admin instructions.   methocarbamol 500 MG tablet Commonly known as: ROBAXIN Take 1 tablet (500 mg total) by mouth every 8 (eight) hours as needed for muscle spasms.   metoprolol succinate 50 MG 24 hr tablet Commonly known as: TOPROL-XL Take 50 mg by mouth daily before breakfast. Take with or immediately following a  meal.   metroNIDAZOLE 500 MG tablet Commonly known as: Flagyl Take 1 tablet (500 mg total) by mouth 2 (two) times daily for 3 days.   NovoLIN 70/30 (70-30) 100 UNIT/ML injection Generic drug: insulin NPH-regular Human Inject 30-72 Units into the skin 2 (two) times daily with a meal. Inject 72 units in the morning and 30 units at bedtime   omeprazole 40 MG capsule Commonly known as: PRILOSEC Take 40 mg by mouth daily.   ondansetron 4 MG tablet Commonly known as: Zofran Take 1 tablet (4 mg total) by mouth every 8 (eight) hours as needed for nausea or vomiting.   ONE TOUCH ULTRA 2 w/Device Kit Use to check blood sugar as directed   pantoprazole 40 MG tablet Commonly known as: PROTONIX Take 2 tablets (80 mg total) by mouth daily.   pravastatin 20 MG tablet Commonly known as: PRAVACHOL Take 20 mg by mouth at bedtime.   sertraline 50 MG tablet Commonly known as: ZOLOFT Take 50 mg by mouth daily.   Vitamin D 50 MCG (2000 UT) tablet Take 2,000 Units by mouth daily.               Durable Medical Equipment  (From admission, onward)           Start     Ordered   02/27/23 0757  For home use only DME Walker rolling  Once       Comments: 5 wheel  Question Answer Comment  Walker: With 5 Inch Wheels   Patient needs a walker to treat with the following condition Weakness      02/27/23 0756             Follow-up Information     Care, [facility] Follow up.   Specialty: Home Health Services Why: Frances Furbish will contact you within 48 hours of discharge to home to schedule your home health visit Contact information: [address] [place] [address] [phone]  Hemberg, Ruby Cola, NP. Schedule an appointment as soon as possible for a visit in 1 week(s).   Specialty: Adult Health Nurse Practitioner Contact information: [address] [place] [address] [phone]         [facility]. Schedule an appointment as soon as possible for a visit in 1 week(s).   Contact information: [address]     [address] [place] [address] [phone]                Major procedures and Radiology Reports - PLEASE review detailed and final reports thoroughly  -     CT HEAD WO CONTRAST ( )  Result Date: 02/25/2023 CLINICAL DATA:  Provided history: Headache, new onset. EXAM: CT HEAD WITHOUT CONTRAST TECHNIQUE: Contiguous axial images were obtained from the base of the skull through the vertex without intravenous contrast. RADIATION DOSE REDUCTION: This exam was performed according to the departmental dose-optimization program which includes automated exposure control, adjustment of the mA and/or kV according to patient size and/or use of iterative reconstruction technique. COMPARISON:  Brain MRI 02/24/2023. Non-contrast head CT and CT angiogram head/neck 02/23/2023. FINDINGS: Brain: No age advanced or lobar predominant parenchymal atrophy. Patchy and ill-defined hypoattenuation within the cerebral white matter, nonspecific but compatible with mild chronic small vessel ischemic disease. There is no acute intracranial hemorrhage. No demarcated cortical infarct. No extra-axial fluid collection. No evidence of an intracranial mass. No midline shift. Vascular: No hyperdense vessel.  Atherosclerotic calcifications. Skull: No calvarial fracture or aggressive osseous lesion. Sinuses/Orbits: No mass or acute finding within the imaged orbits. Mild mucosal thickening within the right maxillary, left sphenoid and right ethmoid sinuses. IMPRESSION: 1.  No evidence of an acute intracranial abnormality. 2. Mild chronic small vessel ischemic changes within the cerebral white matter. 3. Mild paranasal sinus mucosal thickening. Electronically Signed   By: Jackey Loge D.O.   On: 02/25/2023 10:53   Overnight EEG with video  Result Date: 02/25/2023 Charlsie Quest, MD      02/25/2023 12:54 PM Patient Name: MIRCA MYNATT MRN: [id number] Epilepsy Attending: Charlsie Quest Referring Physician/Provider: Mathews Argyle, NP Duration: 02/24/2023 1722 to 02/25/2023 1116  Patient history: 77yo F with ams getting eeg to evaluate for seizure.  Level of alertness:  lethargic-->awake, asleep  AEDs during EEG study: None  Technical aspects: This EEG study was done with scalp electrodes positioned according to the 10-20 International system of electrode placement. Electrical activity was reviewed with band pass filter of 1-70Hz , sensitivity of 7 uV/mm, display speed of 56mm/sec with a 60Hz  notched filter applied as appropriate. EEG data were recorded continuously and digitally stored.  Video monitoring was available and reviewed as appropriate.  Description: At the beginning of the study, EEG showed continuous generalized 3-6hz  theta-delta slowing. Gradually EEG improved and showed posterior dominant rhythm of 8 Hz activity of moderate voltage (25-35 uV) seen predominantly in posterior head regions, symmetric and reactive to eye opening and eye closing. Sleep was characterized by vertex waves, sleep spindles (12-14 Hz), maximal fronto-central region. Intermittent generalized 3-6Hz  theta-delta slowing. Hyperventilation and photic stimulation were not performed.    ABNORMALITY - Continuous slow, generalized  IMPRESSION: This study was initially suggestive of severe diffuse encephalopathy, nonspecific etiology. Gradually EEG improved and was suggestive of mild to moderate diffuse encephalopathy. No seizures or epileptiform discharges were seen throughout the recording.  Charlsie Quest   DG Chest Port 1 View  Result Date: 02/25/2023 CLINICAL DATA:  Provided history: Shortness of breath.  Nausea. EXAM: PORTABLE CHEST 1 VIEW COMPARISON:  Prior chest radiographs 02/23/2023 and earlier. FINDINGS: Heart size within normal limits. Aortic atherosclerosis. Mild ill-defined opacity within the medial right  lung base. Small focus of linear atelectasis within the lateral left lung base. No evidence of pleural effusion or pneumothorax. No acute osseous abnormality identified. IMPRESSION: 1. Mild ill-defined opacity within the right lung base. The appearance favors atelectasis. However, exclude signs/symptoms that would suggest pneumonia. 2. Small focus of linear atelectasis within the left lung base. 3. Aortic Atherosclerosis (ICD10-I70.0). Electronically Signed   By: Jackey Loge D.O.   On: 02/25/2023 08:18   DG Abd Portable 1V  Result Date: 02/25/2023 CLINICAL DATA:  Shortness of breath, nausea EXAM: PORTABLE ABDOMEN - 1 VIEW COMPARISON:  KUB 03/18/2021 FINDINGS: There is a paucity of bowel gas throughout the abdomen without evidence of mechanical obstruction. There is no definite free intraperitoneal air, within the confines of supine technique. There are no appreciable soft tissue calcifications. There is no acute osseous abnormality. IMPRESSION: Unremarkable KUB. Electronically Signed   By: Lesia Hausen M.D.   On: 02/25/2023 08:17   MR BRAIN WO CONTRAST  Result Date: 02/24/2023 CLINICAL DATA:  Neuro deficit with stroke suspected. EXAM: MRI HEAD WITHOUT CONTRAST TECHNIQUE: Multiplanar, multiecho pulse sequences of the brain and surrounding structures were obtained without intravenous contrast. COMPARISON:  Head CT and CTA from yesterday. FINDINGS: Very truncated and motion degraded study, only diffusion imaging and nondiagnostic sagittal T1 weighted sequence were obtained. The axial diffusion imaging is diagnostic and negative for acute infarct. No hydrocephalus or midline shift. IMPRESSION: Truncated and motion degraded study but diagnostic diffusion imaging which is negative for acute infarct. Electronically Signed   By: Tiburcio Pea M.D.   On: 02/24/2023 12:03   Overnight EEG with video  Result Date: 02/24/2023 Charlsie Quest, MD     02/24/2023  8:32 AM Patient Name: DARNEISHA SHAPLEY MRN: [id number]  Epilepsy Attending: Charlsie Quest Referring Physician/Provider: Erick Blinks, MD Duration: 02/24/2023 0044 to 02/24/2023 1308 Patient history: 77yo F with ams getting eeg to evaluate for seizure. Level of alertness:  lethargic AEDs during EEG study: None Technical aspects: This EEG study was done with scalp electrodes positioned according to the 10-20 International system of electrode placement. Electrical activity was reviewed with band pass filter of 1-70Hz , sensitivity of 7 uV/mm, display speed of 16mm/sec with a 60Hz  notched filter applied as appropriate. EEG data were recorded continuously and digitally stored.  Video monitoring was available and reviewed as appropriate. Description: EEG showed continuous generalized rhythmic 2-3hz  delta slowing. Hyperventilation and photic stimulation were not performed.   ABNORMALITY - Continuous slow, generalized IMPRESSION: This study is suggestive of severe diffuse encephalopathy, nonspecific etiology. No seizures or epileptiform discharges were seen throughout the recording. Charlsie Quest   EEG adult now  Result Date: 02/24/2023 Charlsie Quest, MD     02/24/2023  8:34 AM Patient Name: RANIM HERTLING MRN: [id number] Epilepsy Attending: Charlsie Quest Referring Physician/Provider: Tereasa Coop, MD Date: 02/23/2023 Duration: 24.03 mins Patient history:  77yo F with ams getting eeg to evaluate for seizure. Level of alertness:  lethargic AEDs during EEG study: None Technical aspects: This EEG study was done with scalp electrodes positioned according to the 10-20 International system of electrode placement. Electrical activity was reviewed with band pass filter of 1-70Hz , sensitivity of 7 uV/mm, display speed of 47mm/sec with a 60Hz  notched filter applied as appropriate. EEG data  were recorded continuously and digitally stored.  Video monitoring was available and reviewed as appropriate. Description: EEG showed continuous generalized rhythmic 2-3hz  delta  slowing. Hyperventilation and photic stimulation were not performed.    ABNORMALITY - Continuous slow, generalized  IMPRESSION: This study is suggestive of severe diffuse encephalopathy, nonspecific etiology. No seizures or epileptiform discharges were seen throughout the recording.  Charlsie Quest    DG Pelvis Portable  Result Date: 02/23/2023 CLINICAL DATA:  Weakness EXAM: PORTABLE PELVIS 1-2 VIEWS COMPARISON:  CT abdomen and pelvis 03/29/2021 FINDINGS: Thin band of sclerosis about the right femoral head neck junction is favored projectional. If there is concern for acute fracture, CT is recommended. No dislocation. Degenerative changes pubic symphysis, both hips, SI joints and lower lumbar spine. Contrast within the bladder. IMPRESSION: Thin band of sclerosis about the right femoral head neck junction is favored projectional. If there is concern for acute fracture, CT is recommended. Electronically Signed   By: Minerva Fester M.D.   On: 02/23/2023 22:24   DG Chest Port 1 View  Result Date: 02/23/2023 CLINICAL DATA:  Weakness. EXAM: PORTABLE CHEST 1 VIEW COMPARISON:  Chest radiograph dated 01/02/2021. FINDINGS: No focal consolidation, pleural effusion, or pneumothorax. The cardiac silhouette is within normal limits. No acute osseous pathology. IMPRESSION: No active disease. Electronically Signed   By: Elgie Collard M.D.   On: 02/23/2023 20:57   CT ANGIO HEAD NECK W WO CM (CODE STROKE)  Result Date: 02/23/2023 CLINICAL DATA:  Left facial droop EXAM: CT ANGIOGRAPHY HEAD AND NECK WITH AND WITHOUT CONTRAST TECHNIQUE: Multidetector CT imaging of the head and neck was performed using the standard protocol during bolus administration of intravenous contrast. Multiplanar CT image reconstructions and MIPs were obtained to evaluate the vascular anatomy. Carotid stenosis measurements (when applicable) are obtained utilizing NASCET criteria, using the distal internal carotid diameter as the denominator.  RADIATION DOSE REDUCTION: This exam was performed according to the departmental dose-optimization program which includes automated exposure control, adjustment of the mA and/or kV according to patient size and/or use of iterative reconstruction technique. CONTRAST:  75mL OMNIPAQUE IOHEXOL 350 MG/ML SOLN COMPARISON:  None Available. FINDINGS: CTA NECK FINDINGS SKELETON: There is no bony spinal canal stenosis. No lytic or blastic lesion. OTHER NECK: Multiple bilateral thyroid nodules measuring up to 1.5 cm. 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 AORTIC ARCH: There is calcific atherosclerosis of the aortic arch. Conventional 3 vessel aortic branching pattern. RIGHT CAROTID SYSTEM: Normal without aneurysm, dissection or stenosis. LEFT CAROTID SYSTEM: Normal without aneurysm, dissection or stenosis. VERTEBRAL ARTERIES: Left dominant configuration.There is no dissection, occlusion or flow-limiting stenosis to the skull base (V1-V3 segments). CTA HEAD FINDINGS POSTERIOR CIRCULATION: --Vertebral arteries: Normal V4 segments. --Inferior cerebellar arteries: Normal. --Basilar artery: Normal. --Superior cerebellar arteries: Normal. --Posterior cerebral arteries (PCA): Normal. ANTERIOR CIRCULATION: --Intracranial internal carotid arteries: Atherosclerotic calcification of the internal carotid arteries at the skull base without hemodynamically significant stenosis. --Anterior cerebral arteries (ACA): Normal. Both A1 segments are present. Patent anterior communicating artery (a-comm). --Middle cerebral arteries (MCA): Normal. VENOUS SINUSES: As permitted by contrast timing, patent. ANATOMIC VARIANTS: None Review of the MIP images confirms the above findings. IMPRESSION: 1. No emergent large vessel occlusion or hemodynamically significant stenosis of the head or neck. 2. 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 3. Incidental thyroid nodule  measuring 1.5 cm. Recommend non-emergent thyroid ultrasound. Reference: J Am Coll Radiol. 2015 Feb;12(2): 143-50 Aortic Atherosclerosis (ICD10-I70.0). Electronically Signed  By: Deatra Robinson M.D.   On: 02/23/2023 19:31   CT HEAD CODE STROKE WO CONTRAST  Result Date: 02/23/2023 CLINICAL DATA:  Code stroke. Acute neurologic deficit. Left facial droop EXAM: CT HEAD WITHOUT CONTRAST TECHNIQUE: Contiguous axial images were obtained from the base of the skull through the vertex without intravenous contrast. RADIATION DOSE REDUCTION: This exam was performed according to the departmental dose-optimization program which includes automated exposure control, adjustment of the mA and/or kV according to patient size and/or use of iterative reconstruction technique. COMPARISON:  None Available. FINDINGS: Brain: There is no mass, hemorrhage or extra-axial collection. The size and configuration of the ventricles and extra-axial CSF spaces are normal. The brain parenchyma is normal, without evidence of acute or chronic infarction. Vascular: No abnormal hyperdensity of the major intracranial arteries or dural venous sinuses. No intracranial atherosclerosis. Skull: The visualized skull base, calvarium and extracranial soft tissues are normal. Sinuses/Orbits: No fluid levels or advanced mucosal thickening of the visualized paranasal sinuses. No mastoid or middle ear effusion. The orbits are normal. ASPECTS [facility] Stroke Program Early CT Score) - Ganglionic level infarction (caudate, lentiform nuclei, internal capsule, insula, M1-M3 cortex): 7 - Supraganglionic infarction (M4-M6 cortex): 3 Total score (0-10 with 10 being normal): 10 IMPRESSION: 1. No acute intracranial abnormality. 2. ASPECTS is 10. These results were communicated to Dr. Erick Blinks at 7:23 pm on 02/23/2023 by text page via the [facility] messaging system. Electronically Signed   By: Deatra Robinson M.D.   On: 02/23/2023 19:23    Micro Results    No results  found for this or any previous visit (from the past 240 hour(s)).  Today   Subjective    Janice Payne today has no headache,no chest abdominal pain,no new weakness tingling or numbness, feels much better wants to go home today, refused SNF.    Objective   Blood pressure 123/64, pulse 80, temperature 98 F (36.7 C), resp. rate 15, height 5\' 6"  (1.676 m), weight 107.2 kg, SpO2 96 %.   Intake/Output Summary (Last 24 hours) at 02/27/2023 0805 Last data filed at 02/27/2023 0500 Gross per 24 hour  Intake 340 ml  Output 1550 ml  Net -1210 ml    Exam  Awake Alert, No new F.N deficits,    [place].AT,PERRAL Supple Neck,   Symmetrical Chest wall movement, Good air movement bilaterally, CTAB RRR,No Gallops,   +ve B.Sounds, Abd Soft, Non tender,  No Cyanosis, Clubbing or edema    Data Review   Recent Labs  Lab 02/23/23 1859 02/23/23 1932 02/24/23 0154 02/25/23 0432 02/26/23 0310  WBC 8.3  --  9.1 9.0 7.4  HGB 12.5 12.6 12.9 11.7* 11.7*  HCT 37.2 37.0 41.3 35.5* 35.3*  PLT 162  --  138* 151 151  MCV 86.1  --  87.7 83.9 85.9  MCH 28.9  --  27.4 27.7 28.5  MCHC 33.6  --  31.2 33.0 33.1  RDW 12.5  --  12.4 12.5 12.7  LYMPHSABS 1.4  --   --  1.2 1.1  MONOABS 0.5  --   --  0.7 0.6  EOSABS 0.0  --   --  0.0 0.2  BASOSABS 0.0  --   --  0.0 0.1    Recent Labs  Lab 02/23/23 1859 02/23/23 1932 02/24/23 0154 02/24/23 1220 02/25/23 0432 02/26/23 0310  NA 132* 135 134*  --  132* 132*  K 3.5 3.6 3.4*  --  3.5 3.6  CL 97* 99 98  --  101 100  CO2 24  --  19*  --  22 22  ANIONGAP 11  --  17*  --  9 10  GLUCOSE 129* 124* 181*  --  185* 186*  BUN 14 15 15   --  11 17  CREATININE 1.52* 1.60* 1.50*  --  1.34* 1.63*  AST 16  --  17  --  21  --   ALT 14  --  14  --  12  --   ALKPHOS 34*  --  35*  --  39  --   BILITOT 1.0  --  1.1  --  0.8  --   ALBUMIN 3.5  --  3.6  --  3.3*  --   CRP  --   --   --  1.3* 4.9* 2.3*  PROCALCITON  --   --   --  <0.10 <0.10 <0.10  INR 1.0  --   --   --    --   --   TSH  --   --  1.085  --   --   --   HGBA1C 7.1*  --   --   --   --   --   AMMONIA  --   --   --  23 44* 27  BNP  --   --   --   --  128.9* 80.5  MG 1.8  --   --   --  1.8 1.9  CALCIUM 9.2  --  9.4  --  9.0 9.0    Total Time in preparing paper work, data evaluation and todays exam - 35 minutes  Signature  -    Susa Raring M.D on 02/27/2023 at 8:05 AM   -  To page go to [email or web address]

## 2023-02-27 NOTE — TOC Transition Note (Signed)
Transition of Care [facility]) - CM/SW Discharge Note   Patient Details  Name: Janice Payne MRN: [id number] Date of Birth: 09-Nov-1945  Transition of Care [facility]) CM/SW Contact:  Lawerance Sabal, RN Phone Number: 02/27/2023, 8:39 AM   Clinical Narrative:     Spoke to patient and visitor at bedside.  Notified [facility] of DC today.  Discussed DME. She has obtained a RW, requesting 3/1, agreeable for home delivery. [employer] will deliver 3/1 to her home, address verified.   Final next level of care: Home w Home Health Services Barriers to Discharge: No Barriers Identified   Patient Goals and CMS Choice CMS Medicare.gov Compare Post Acute Care list provided to:: Patient Choice offered to / list presented to : Patient, Adult Children  Discharge Placement                         Discharge Plan and Services Additional resources added to the After Visit Summary for   In-house Referral: Clinical Social Work Discharge Planning Services: CM Consult Post Acute Care Choice: Durable Medical Equipment, Home Health          DME Arranged: Bedside commode DME Agency: [employer] Date DME Agency Contacted: 02/27/23 Time DME Agency Contacted: [phone] Representative spoke with at DME Agency: Vaughan Basta HH Arranged: PT, RN, Nurse's Aide HH Agency: [facility] Date [facility] Agency Contacted: 02/27/23 Time HH Agency Contacted: [phone] Representative spoke with at [facility] Agency: Kandee Keen  Social Determinants of Health (SDOH) Interventions SDOH Screenings   Housing: Patient Unable To Answer (02/24/2023)  Tobacco Use: Low Risk  (02/23/2023)     Readmission Risk Interventions     No data to display

## 2023-02-27 NOTE — Discharge Instructions (Addendum)
Do not drive, operate heavy machinery, perform activities at heights, swimming or participation in water activities or provide baby sitting services until you have seen by Primary MD or a Neurologist and advised to do so again.  Follow with Primary MD Rebecka Apley, NP in 7 days   Get CBC, CMP, ammonia, magnesium, 2 view Chest X ray, you also need outpatient thyroid ultrasound ordered by your PCP, kindly follow-up within a week.   Activity: As tolerated with Full fall precautions use walker/cane & assistance as needed  Disposition Home   Diet: Heart Healthy low carbohydrate diet, check CBGs q. [facility] S.  Special Instructions: If you have smoked or chewed Tobacco  in the last 2 yrs please stop smoking, stop any regular Alcohol  and or any Recreational drug use.  On your next visit with your primary care physician please Get Medicines reviewed and adjusted.  Please request your Prim.MD to go over all Hospital Tests and Procedure/Radiological results at the follow up, please get all Hospital records sent to your Prim MD by signing hospital release before you go home.  If you experience worsening of your admission symptoms, develop shortness of breath, life threatening emergency, suicidal or homicidal thoughts you must seek medical attention immediately by calling 911 or calling your MD immediately  if symptoms less severe.  You Must read complete instructions/literature along with all the possible adverse reactions/side effects for all the Medicines you take and that have been prescribed to you. Take any new Medicines after you have completely understood and accpet all the possible adverse reactions/side effects.

## 2023-03-03 ENCOUNTER — Ambulatory Visit: Payer: Medicare Other | Admitting: Neurology

## 2023-03-03 ENCOUNTER — Encounter: Payer: Self-pay | Admitting: Neurology

## 2023-03-03 VITALS — BP 129/76 | HR 73 | Ht 65.0 in | Wt 232.2 lb

## 2023-03-03 DIAGNOSIS — E1142 Type 2 diabetes mellitus with diabetic polyneuropathy: Secondary | ICD-10-CM

## 2023-03-03 DIAGNOSIS — G40909 Epilepsy, unspecified, not intractable, without status epilepticus: Secondary | ICD-10-CM

## 2023-03-03 MED ORDER — LEVETIRACETAM 500 MG PO TABS
500.0000 mg | ORAL_TABLET | Freq: Two times a day (BID) | ORAL | 11 refills | Status: DC
Start: 2023-03-03 — End: 2023-09-14

## 2023-03-03 NOTE — Progress Notes (Signed)
Chief Complaint  Patient presents with   New Patient (Initial Visit)    Rm 15. Accompanied by Janalyn Shy. New onset seizure. Denies any new seizure like activity.      ASSESSMENT AND PLAN  Janice Payne is a 77 y.o. female   Generalized tonic-clonic seizure seizure per record on February 23, 2023,  Her friend described prolonged confusion, word finding difficulties prior to seizure suggestive of partial onset,  Complete evaluation with MRI of the brain without contrast (GFR 35)  EEG  Will start keppra 500mg  bid  No driving until seizure-free for 6 months  Return To Clinic With NP In 6 Months   DIAGNOSTIC DATA (LABS, IMAGING, TESTING) - I reviewed patient records, labs, notes, testing and imaging myself where available.   MEDICAL HISTORY:  Janice Payne is a 77 year old female, seen in request by her primary care nurse practitioner Sharon Seller for evaluation of seizure, accompanied by her friend Janalyn Shy at today's visit March 03, 2023  I reviewed and summarized the referring note. PMHX HTN DM-insulin shot HLD Depression GERD  She had long history of diabetes, does insulin shots herself, lives with her son at home, she is sedentary most of the time, but before hospital admission on February 23, 2023, she ambulated without walker, driving, taking care of household and her dogs  At night of February 22, 2023, getting up using bathroom from sleep, she felt dizzy, fell, sat on the floor for 2 hours, could not get herself up, could not find her cell phone, eventually she was able to find her cell phone at the pocket book, called her son at the same household, they called paramedic to help her up,  Rest of the day, she sat in chair most of the time, 8 hours later, when her son came back from work, finds she was confused, difficult to express herself, called ambulance again and sent to emergency room  When her friend [name] met her at the emergency room, patient was  mumbling, later had generalized tonic-clonic seizure, patient remember on her route to emergency room, no warning signs, wake up in hospital bed,  She was treated with Keppra during hospital stay, EEG showed severe diffuse encephalopathy, generalized rhythmic 2 to 3 Hz delta slowing,  She was not able to hold still for MRI, but diffusion-weighted imaging is negative for acute infarction  Laboratory evaluation showed B12 179, C-reactive protein 1.3, slight elevation of free T4, sodium 134, glucose 181, creatinine 1.5, CBC showed no significant abnormality  She was discharged without continuation of her Keppra  Long history of diabetes, bilateral feet paresthesia, generalized weakness, gait abnormality using walker now  PHYSICAL EXAM:   Vitals:   03/03/23 1115  BP: 129/76  Pulse: 73  Weight: 232 lb 3.2 oz (105.3 kg)  Height: 5\' 5"  (1.651 m)   Body mass index is 38.64 kg/m.  PHYSICAL EXAMNIATION:  Gen: NAD, conversant, well nourised, well groomed                     Cardiovascular: Regular rate rhythm, no peripheral edema, warm, nontender. Eyes: Conjunctivae clear without exudates or hemorrhage Neck: Supple, no carotid bruits. Pulmonary: Clear to auscultation bilaterally   NEUROLOGICAL EXAM:  MENTAL STATUS: Speech/cognition: Awake, alert, oriented to history taking and casual conversation  .y CRANIAL NERVES: CN II: Visual fields are full to confrontation. Pupils are round equal and briskly reactive to light. CN III, IV, VI: extraocular movement are normal. No ptosis.  CN V: Facial sensation is intact to light touch CN VII: Face is symmetric with normal eye closure  CN VIII: Hearing is normal to causal conversation. CN IX, X: Phonation is normal. CN XI: Head turning and shoulder shrug are intact  MOTOR: There is no pronator drift of out-stretched arms. Muscle bulk and tone are normal. Muscle strength is normal.  REFLEXES: Reflexes are 1 and symmetric at the biceps,  triceps, knees, and absent at ankles. Plantar responses are flexor.  SENSORY: Length-dependent decreased light touch, pinprick to distal shin level,  COORDINATION: There is no trunk or limb dysmetria noted.  GAIT/STANCE: Push-up to get up from seated position, cautious unsteady  REVIEW OF SYSTEMS:  Full 14 system review of systems performed and notable only for as above All other review of systems were negative.   ALLERGIES: Allergies  Allergen Reactions   Niacin Itching and Rash   Codeine Nausea And Vomiting and Nausea Only   Niaspan [Niacin Er] Nausea And Vomiting   Penicillins Rash   Protonix [Pantoprazole] Nausea Only    HOME MEDICATIONS: Current Outpatient Medications  Medication Sig Dispense Refill   acetaminophen (TYLENOL) 325 MG tablet Take 650 mg by mouth every 6 (six) hours as needed for mild pain, fever or headache.     ALPRAZolam (XANAX) 0.5 MG tablet Take 0.5 mg by mouth at bedtime as needed for sleep.     Blood Glucose Monitoring Suppl (ONE TOUCH ULTRA 2) w/Device KIT Use to check blood sugar as directed     cetirizine (ZYRTEC) 10 MG tablet Take 10 mg by mouth at bedtime.     Cholecalciferol (VITAMIN D) 2000 UNITS tablet Take 2,000 Units by mouth daily.     clotrimazole-betamethasone (LOTRISONE) cream Apply 1 application topically 2 (two) times daily as needed (rash/irritation).     cyanocobalamin 1000 MCG tablet Take 1 tablet (1,000 mcg total) by mouth daily. 30 tablet 0   fenofibrate 160 MG tablet Take 160 mg by mouth daily before breakfast.     gabapentin (NEURONTIN) 300 MG capsule Take 300 mg by mouth 2 (two) times daily.     Glucose Blood (BLOOD GLUCOSE TEST STRIPS) STRP Test blood sugars twice daily. Dispense strips for Onetouch Ultra 2     hydrochlorothiazide (HYDRODIURIL) 12.5 MG tablet Take 12.5 mg by mouth daily.     insulin NPH-regular Human (NOVOLIN 70/30) (70-30) 100 UNIT/ML injection Inject 30-72 Units into the skin 2 (two) times daily with a meal.  Inject 72 units in the morning and 30 units at bedtime     Insulin Pen Needle 31G X 6 MM MISC See admin instructions.     methocarbamol (ROBAXIN) 500 MG tablet Take 1 tablet (500 mg total) by mouth every 8 (eight) hours as needed for muscle spasms. 30 tablet 0   metoprolol succinate (TOPROL-XL) 50 MG 24 hr tablet Take 50 mg by mouth daily before breakfast. Take with or immediately following a meal.     omeprazole (PRILOSEC) 40 MG capsule Take 40 mg by mouth daily.     ondansetron (ZOFRAN) 4 MG tablet Take 1 tablet (4 mg total) by mouth every 8 (eight) hours as needed for nausea or vomiting. 12 tablet 0   pravastatin (PRAVACHOL) 20 MG tablet Take 20 mg by mouth at bedtime.      sertraline (ZOLOFT) 50 MG tablet Take 50 mg by mouth daily.     No current facility-administered medications for this visit.    PAST MEDICAL HISTORY: Past Medical History:  Diagnosis Date   Arthritis    left hip, neck   Dental crown present    Difficult airway    Dry mouth    GERD (gastroesophageal reflux disease)    History of asthma    no current problems or med.   History of kidney stones    Hyperlipidemia    Hypertension    under control with meds.   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 Cornett, MD;  Location: [facility];  Service: General;  Laterality: Right;   cataract surgery Bilateral 2024   EXTRACORPOREAL SHOCK WAVE LITHOTRIPSY  11/27/2012   LAPAROTOMY N/A 03/16/2021   Procedure: Diagnostic Laparoscopy, Exploratory Laparotomy with colon repair;  Surgeon: Fritzi Mandes, MD;  Location: [facility] OR;  Service: General;  Laterality: N/A;   TUBAL LIGATION      FAMILY HISTORY: Family History  Problem Relation Age of  Onset   Heart disease Father     SOCIAL HISTORY: Social History   Socioeconomic History   Marital status: Widowed    Spouse name: Not on file   Number of children: Not on file   Years of education: Not on file   Highest education level: Not on file  Occupational History   Not on file  Tobacco Use   Smoking status: Never   Smokeless tobacco: Never  Substance and Sexual Activity   Alcohol use: No   Drug use: No   Sexual activity: Not on file  Other Topics Concern   Not on file  Social History Narrative   Not on file   Social Determinants of Health   Financial Resource Strain: Low Risk  (02/15/2023)   Received from [facility]   Overall Financial Resource Strain (CARDIA)    Difficulty of Paying Living Expenses: Not hard at all  Food Insecurity: No Food Insecurity (02/15/2023)   Received from [facility], [facility]   Hunger Vital Sign    Worried About Running Out of Food in the Last Year: Never true    Ran Out of Food in the Last Year: Never true  Transportation Needs: No Transportation Needs (02/15/2023)   Received from [facility]   PRAPARE - Transportation    Lack of Transportation (Medical): No    Lack of Transportation (Non-Medical): No  Physical Activity: Unknown (02/15/2023)   Received from [facility]   Exercise Vital Sign    Days of Exercise per Week: 0 days    Minutes of Exercise per Session: Not on file  Stress: No Stress Concern Present (02/15/2023)   Received from [facility], [facility] Occupational Health - Occupational Stress Questionnaire    Feeling of Stress : Not at all  Social Connections: Moderately Integrated (02/15/2023)   Received from [facility], [facility]   Social Network    How would you rate your social network (family, work, friends)?: Adequate participation with social networks  Intimate Partner Violence: Not At Risk (02/15/2023)   Received from  [facility], [facility]   HITS    Over the last 12 months how often did your partner physically hurt you?: 1    Over the last 12  months how often did your partner insult you or talk down to you?: 1    Over the last 12 months how often did your partner threaten you with physical harm?: 1    Over the last 12 months how often did your partner scream or curse at you?: 1      Levert Feinstein, M.D. Ph.D.  [facility] [address] [place], [place] [address] Ph: [phone] Fax: [phone]  CC:  Rebecka Apley, NP [address] [place],  [place] [address]  Rebecka Apley, NP

## 2023-03-08 ENCOUNTER — Telehealth: Payer: Self-pay | Admitting: Neurology

## 2023-03-08 NOTE — Telephone Encounter (Signed)
 [employer] medicare NPR sent to [facility] for open MRI [phone] 

## 2023-03-15 ENCOUNTER — Other Ambulatory Visit: Payer: Self-pay | Admitting: Psychiatry

## 2023-03-15 ENCOUNTER — Telehealth: Payer: Self-pay | Admitting: Neurology

## 2023-03-15 NOTE — Telephone Encounter (Signed)
Pt returned call and I stated take keppra: 1/2 tab in am and a full one in the pm and she voiced understanding

## 2023-03-15 NOTE — Telephone Encounter (Signed)
She can try taking 250 mg in the morning (1/2 tablet) and 500 mg in the evening and see if that helps. Please have her call the office if she has any recurrent episodes concerning for seizure at the lower dose

## 2023-03-15 NOTE — Telephone Encounter (Signed)
Called and spoke to [facility] she think keppra should be changed to something else or reduced because its making her super drowsy

## 2023-03-15 NOTE — Telephone Encounter (Signed)
[facility]) pt said levETIRAcetam (KEPPRA) 500 MG tablet making pt extremely drowsy. Pt asking if can lower the dosage or switch the medication. Would like a call back.

## 2023-03-15 NOTE — Telephone Encounter (Signed)
Lvm 1st attempt on the pt home phone

## 2023-03-23 ENCOUNTER — Ambulatory Visit: Payer: Medicare Other | Admitting: Neurology

## 2023-03-23 DIAGNOSIS — G40909 Epilepsy, unspecified, not intractable, without status epilepticus: Secondary | ICD-10-CM | POA: Diagnosis not present

## 2023-03-23 DIAGNOSIS — E1142 Type 2 diabetes mellitus with diabetic polyneuropathy: Secondary | ICD-10-CM

## 2023-04-04 ENCOUNTER — Telehealth: Payer: Self-pay | Admitting: Neurology

## 2023-04-04 NOTE — Telephone Encounter (Signed)
Pt called wanting to know when she will be getting her MRI and EEG results. Please advise.

## 2023-04-08 NOTE — Procedures (Signed)
   HISTORY: 77 year old female presenting with seizure in July  TECHNIQUE:  This is a routine 16 channel EEG recording with one channel devoted to a limited EKG recording.  It was performed during wakefulness, drowsiness and asleep.  Photic stimulation were performed as activating procedures.  There are minimum muscle and movement artifact noted.  Upon maximum arousal, posterior dominant waking rhythm consistent of mildly dysrhythmic theta range activity. Activities are symmetric over the bilateral posterior derivations and attenuated with eye opening.  Photic stimulation did not alter the tracing.  Hyperventilation was not performed  During EEG recording, patient developed drowsiness and no deeper stage of sleep was achieved During EEG recording, there was no epileptiform discharge noted.  EKG demonstrate normal sinus rhythm.  CONCLUSION: This is a mild abnormal study, there is evidence of mild background slowing indicating mild bihemispheric malfunction.  There was no epileptiform discharge.  Levert Feinstein, M.D. Ph.D.  [facility] [address] [place], [place] [address] Phone: [phone] Fax:      [phone]

## 2023-04-11 ENCOUNTER — Telehealth: Payer: Self-pay | Admitting: Neurology

## 2023-04-11 NOTE — Telephone Encounter (Signed)
Please call patient, MRI of the brain without contrast from [facility] March 21, 2023 showed no acute intracranial abnormality, moderate small vessel disease    1.   No acute intracranial pathology, specifically no seizure focus identified.   2.  Moderate chronic small vessel ischemic change.

## 2023-04-12 NOTE — Telephone Encounter (Signed)
Called patient and relayed results. Pt verbalized understanding. Pt had no questions at this time but was encouraged to call back if questions arise. But asking for EEG results

## 2023-04-18 ENCOUNTER — Telehealth: Payer: Self-pay | Admitting: Neurology

## 2023-04-18 NOTE — Telephone Encounter (Signed)
1.   No acute intracranial pathology, specifically no seizure focus identified.   2.  Moderate chronic small vessel ischemic change.

## 2023-09-13 NOTE — Progress Notes (Unsigned)
No chief complaint on file.     ASSESSMENT AND PLAN  Janice Payne is a 78 y.o. female   Generalized tonic-clonic seizure seizure per record on February 23, 2023,  Per ED, felt related to dehydration and UTI No additional seizures  Continue Keppra 500 mg twice daily Seizures described as prolonged confusion, word finding difficulties prior to seizure suggestive of partial onset - had 1 GTC seizure in ED on 02/23/2023  MR brain 03/21/2023 no acute intracranial pathology  EEG 03/23/2023 no epileptiform discharges  LTM EEG 02/24/2023 severe diffuse encephalopathy, no seizures or epileptiform discharges       DIAGNOSTIC DATA (LABS, IMAGING, TESTING) - I reviewed patient records, labs, notes, testing and imaging myself where available.   MEDICAL HISTORY:  Update 09/14/2023 JM: patient returns for follow up visit.   Denies any additional seizure activity.  Tolerating Keppra without side effects.  EEG mild background slowing indicating mild bihemispheric malfunction, no epileptiform discharges.   MRI brain 7/29 no intracranial pathology specifically no seizure focus identified, moderate chronic small vessel ischemic changes (completed through [facility], view via Care everywhere)       Consult visit 03/03/2023 Dr. Terrace Arabia: Janice Payne is a 78 year old female, seen in request by her primary care nurse practitioner Sharon Seller for evaluation of seizure, accompanied by her friend Janalyn Shy at today's visit March 03, 2023  I reviewed and summarized the referring note. PMHX HTN DM-insulin shot HLD Depression GERD  She had long history of diabetes, does insulin shots herself, lives with her son at home, she is sedentary most of the time, but before hospital admission on February 23, 2023, she ambulated without walker, driving, taking care of household and her dogs  At night of February 22, 2023, getting up using bathroom from sleep, she felt dizzy, fell, sat on the floor for 2 hours,  could not get herself up, could not find her cell phone, eventually she was able to find her cell phone at the pocket book, called her son at the same household, they called paramedic to help her up,  Rest of the day, she sat in chair most of the time, 8 hours later, when her son came back from work, finds she was confused, difficult to express herself, called ambulance again and sent to emergency room  When her friend [name] met her at the emergency room, patient was mumbling, later had generalized tonic-clonic seizure, patient remember on her route to emergency room, no warning signs, wake up in hospital bed,  She was treated with Keppra during hospital stay, EEG showed severe diffuse encephalopathy, generalized rhythmic 2 to 3 Hz delta slowing,  She was not able to hold still for MRI, but diffusion-weighted imaging is negative for acute infarction  Laboratory evaluation showed B12 179, C-reactive protein 1.3, slight elevation of free T4, sodium 134, glucose 181, creatinine 1.5, CBC showed no significant abnormality  She was discharged without continuation of her Keppra  Long history of diabetes, bilateral feet paresthesia, generalized weakness, gait abnormality using walker now  PHYSICAL EXAM:   There were no vitals filed for this visit.  There is no height or weight on file to calculate BMI.  PHYSICAL EXAMNIATION:  Gen: NAD, conversant, well nourised, well groomed                     Cardiovascular: Regular rate rhythm, no peripheral edema, warm, nontender. Eyes: Conjunctivae clear without exudates or hemorrhage Neck: Supple, no carotid bruits. Pulmonary:  Clear to auscultation bilaterally   NEUROLOGICAL EXAM:  MENTAL STATUS: Speech/cognition: Awake, alert, oriented to history taking and casual conversation  .y CRANIAL NERVES: CN II: Visual fields are full to confrontation. Pupils are round equal and briskly reactive to light. CN III, IV, VI: extraocular movement are normal.  No ptosis. CN V: Facial sensation is intact to light touch CN VII: Face is symmetric with normal eye closure  CN VIII: Hearing is normal to causal conversation. CN IX, X: Phonation is normal. CN XI: Head turning and shoulder shrug are intact  MOTOR: There is no pronator drift of out-stretched arms. Muscle bulk and tone are normal. Muscle strength is normal.  REFLEXES: Reflexes are 1 and symmetric at the biceps, triceps, knees, and absent at ankles. Plantar responses are flexor.  SENSORY: Length-dependent decreased light touch, pinprick to distal shin level,  COORDINATION: There is no trunk or limb dysmetria noted.  GAIT/STANCE: Push-up to get up from seated position, cautious unsteady  REVIEW OF SYSTEMS:  Full 14 system review of systems performed and notable only for as above All other review of systems were negative.   ALLERGIES: Allergies  Allergen Reactions   Niacin Itching and Rash   Codeine Nausea And Vomiting and Nausea Only   Niaspan [Niacin Er (Antihyperlipidemic)] Nausea And Vomiting   Penicillins Rash   Protonix [Pantoprazole] Nausea Only    HOME MEDICATIONS: Current Outpatient Medications  Medication Sig Dispense Refill   acetaminophen (TYLENOL) 325 MG tablet Take 650 mg by mouth every 6 (six) hours as needed for mild pain, fever or headache.     ALPRAZolam (XANAX) 0.5 MG tablet Take 0.5 mg by mouth at bedtime as needed for sleep.     Blood Glucose Monitoring Suppl (ONE TOUCH ULTRA 2) w/Device KIT Use to check blood sugar as directed     cetirizine (ZYRTEC) 10 MG tablet Take 10 mg by mouth at bedtime.     Cholecalciferol (VITAMIN D) 2000 UNITS tablet Take 2,000 Units by mouth daily.     clotrimazole-betamethasone (LOTRISONE) cream Apply 1 application topically 2 (two) times daily as needed (rash/irritation).     cyanocobalamin 1000 MCG tablet Take 1 tablet (1,000 mcg total) by mouth daily. 30 tablet 0   fenofibrate 160 MG tablet Take 160 mg by mouth daily  before breakfast.     gabapentin (NEURONTIN) 300 MG capsule Take 300 mg by mouth 2 (two) times daily.     Glucose Blood (BLOOD GLUCOSE TEST STRIPS) STRP Test blood sugars twice daily. Dispense strips for Onetouch Ultra 2     hydrochlorothiazide (HYDRODIURIL) 12.5 MG tablet Take 12.5 mg by mouth daily.     insulin NPH-regular Human (NOVOLIN 70/30) (70-30) 100 UNIT/ML injection Inject 30-72 Units into the skin 2 (two) times daily with a meal. Inject 72 units in the morning and 30 units at bedtime     Insulin Pen Needle 31G X 6 MM MISC See admin instructions.     levETIRAcetam (KEPPRA) 500 MG tablet Take 1 tablet (500 mg total) by mouth 2 (two) times daily. 60 tablet 11   methocarbamol (ROBAXIN) 500 MG tablet Take 1 tablet (500 mg total) by mouth every 8 (eight) hours as needed for muscle spasms. 30 tablet 0   metoprolol succinate (TOPROL-XL) 50 MG 24 hr tablet Take 50 mg by mouth daily before breakfast. Take with or immediately following a meal.     omeprazole (PRILOSEC) 40 MG capsule Take 40 mg by mouth daily.     ondansetron (ZOFRAN)  4 MG tablet Take 1 tablet (4 mg total) by mouth every 8 (eight) hours as needed for nausea or vomiting. 12 tablet 0   pravastatin (PRAVACHOL) 20 MG tablet Take 20 mg by mouth at bedtime.      sertraline (ZOLOFT) 50 MG tablet Take 50 mg by mouth daily.     No current facility-administered medications for this visit.    PAST MEDICAL HISTORY: Past Medical History:  Diagnosis Date   Arthritis    left hip, neck   Dental crown present    Difficult airway    Dry mouth    GERD (gastroesophageal reflux disease)    History of asthma    no current problems or med.   History of kidney stones    Hyperlipidemia    Hypertension    under control with meds.   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 Cornett, MD;  Location: [facility];  Service: General;  Laterality: Right;   cataract surgery Bilateral 2024   EXTRACORPOREAL SHOCK WAVE LITHOTRIPSY  11/27/2012   LAPAROTOMY N/A 03/16/2021   Procedure: Diagnostic Laparoscopy, Exploratory Laparotomy with colon repair;  Surgeon: Fritzi Mandes, MD;  [facility] OR;  Service: General;  Laterality: N/A;   TUBAL LIGATION      FAMILY HISTORY: Family History  Problem Relation Age of Onset   Heart disease Father     SOCIAL HISTORY: Social History   Socioeconomic History   Marital status: Widowed    Spouse name: Not on file   Number of children: Not on file   Years of education: Not on file   Highest education level: Not on file  Occupational History   Not on file  Tobacco Use   Smoking status: Never   Smokeless tobacco: Never  Substance and Sexual Activity   Alcohol use: No   Drug use: No   Sexual activity: Not on file  Other Topics Concern   Not on file  Social History Narrative   Not on file   Social Drivers of Health   Financial Resource Strain: Low Risk  (02/15/2023)   Received from [facility], [facility]   Overall Financial Resource Strain (CARDIA)    Difficulty of Paying Living Expenses: Not hard at all  Food Insecurity: No Food Insecurity (02/15/2023)   Received from [facility], [facility]   Hunger Vital Sign    Worried About Running Out of Food in the Last Year: Never true    Ran Out of Food in the Last Year: Never true  Transportation Needs: No Transportation Needs (02/15/2023)   Received from [facility], [facility]   PRAPARE - Transportation    Lack of Transportation (Medical): No    Lack of Transportation (Non-Medical): No  Physical Activity: Unknown (02/15/2023)   Received from [facility], [facility]   Exercise Vital Sign    Days of Exercise per Week: 0 days    Minutes  of Exercise per Session: Not on file  Stress: No Stress Concern Present (02/15/2023)   Received from [facility], [facility] - Occupational Stress Questionnaire    Feeling of Stress : Not at all  Social Connections: Moderately  Integrated (02/15/2023)   Received from [facility], [facility]   Social Network    How would you rate your social network (family, work, friends)?: Adequate participation with social networks  Intimate Partner Violence: Not At Risk (02/15/2023)   Received from [facility], [facility]   HITS    Over the last 12 months how often did your partner physically hurt you?: Never    Over the last 12 months how often did your partner insult you or talk down to you?: Never    Over the last 12 months how often did your partner threaten you with physical harm?: Never    Over the last 12 months how often did your partner scream or curse at you?: Never      I spent *** minutes of face-to-face and non-face-to-face time with patient.  This included previsit chart review, lab review, study review, order entry, electronic health record documentation, patient education and discussion regarding above diagnoses and treatment plan and answered all other questions to patient's satisfaction  Ihor Austin, [facility]  [facility] [address] [place], [place] [address]  Phone [phone] Fax [phone] Note: This document was prepared with digital dictation and possible smart phrase technology. Any transcriptional errors that result from this process are unintentional.

## 2023-09-14 ENCOUNTER — Ambulatory Visit: Payer: Medicare Other | Admitting: Adult Health

## 2023-09-14 ENCOUNTER — Encounter: Payer: Self-pay | Admitting: Adult Health

## 2023-09-14 VITALS — BP 147/86 | HR 90 | Ht 65.0 in | Wt 241.0 lb

## 2023-09-14 DIAGNOSIS — G40909 Epilepsy, unspecified, not intractable, without status epilepticus: Secondary | ICD-10-CM | POA: Diagnosis not present

## 2023-09-14 MED ORDER — LEVETIRACETAM ER 750 MG PO TB24: 750.0000 mg | ORAL_TABLET | Freq: Every day | ORAL | 11 refills | Status: DC

## 2023-09-14 NOTE — Patient Instructions (Addendum)
Your Plan:  Will switch to keppra XR (extended release) 750mg  nightly  Please call with any seizure activity     Follow-up in 6 months or earlier if needed     Thank you for coming to see [place] at [facility]. I hope we have been able to provide you high quality care today.  You may receive a patient satisfaction survey over the next few weeks. We would appreciate your feedback and comments so that we may continue to improve ourselves and the health of our patients.

## 2024-03-23 NOTE — Progress Notes (Signed)
 Chief Complaint  Patient presents with   Follow-up    RM 3, Pt alone. F/u for seizures. Pt denies any seizures since last visit. Pt currently on 2 ABT for diverticulitis    ASSESSMENT AND PLAN  Janice Payne is a 78 y.o. female   Generalized tonic-clonic seizure seizure per record on February 23, 2023,  Per ED, felt related to dehydration and UTI No additional seizures  Continue Keppra  XR 750 mg nightly - advised to avoid breaking in half, if continues to have difficulty swallowing whole pill, may have to consider alternative treatment  Seizures described as prolonged confusion, word finding difficulties prior to seizure suggestive of partial onset - had 1 GTC seizure in ED on 02/23/2023  MR brain 03/21/2023 no acute intracranial pathology  EEG 03/23/2023 no epileptiform discharges  LTM EEG 02/24/2023 severe diffuse encephalopathy, no seizures or epileptiform discharges     Follow-up in 1 year or call earlier if needed    DIAGNOSTIC DATA (LABS, IMAGING, TESTING) - I reviewed patient records, labs, notes, testing and imaging myself where available.   MEDICAL HISTORY:  Update 03/26/2024 JM: Patient returns for follow-up visit.  At prior visit, she was switched from Keppra  IR to XR due to fatigue complaints.   Currently, she reports doing well without any seizure activity.  Doing well on Keppra  XR and denies side effects. At times will need to cut pills in half as they can get stuck in her throat.  Continues to maintain ADLs and IADLs independently, continues to drive without difficulty.  Continues to ambulate with cane, no recent falls.       Update 09/14/2023 JM: patient returns for follow up visit unaccompanied.  Denies any additional seizure activity.  Remains on Keppra , reports currently taking 250mg  AM and 500mg  PM due to morning dose cause drowsiness and weakness sensation, has improved some on lower morning dosage but still present. Has since returned back to driving.  Maintains  ADLs and IADLs independently.  Does have chronic gait instability which she believes is due to chronic neuropathy, able to ambulate with AD but sometimes will need assistance on uneven ground, no recent falls.   EEG mild background slowing indicating mild bihemispheric malfunction, no epileptiform discharges.   MRI brain 7/29 no intracranial pathology specifically no seizure focus identified, moderate chronic small vessel ischemic changes (completed through [facility], view via Care everywhere)  Consult visit 03/03/2023 Dr. Onita: Janice Payne is a 78 year old female, seen in request by her primary care nurse practitioner Baird Crank for evaluation of seizure, accompanied by her friend [name] at today's visit March 03, 2023  I reviewed and summarized the referring note. PMHX HTN DM-insulin  shot HLD Depression GERD  She had long history of diabetes, does insulin  shots herself, lives with her son at home, she is sedentary most of the time, but before hospital admission on February 23, 2023, she ambulated without walker, driving, taking care of household and her dogs  At night of February 22, 2023, getting up using bathroom from sleep, she felt dizzy, fell, sat on the floor for 2 hours, could not get herself up, could not find her cell phone, eventually she was able to find her cell phone at the pocket book, called her son at the same household, they called paramedic to help her up,  Rest of the day, she sat in chair most of the time, 8 hours later, when her son came back from work, finds she was confused, difficult  to express herself, called ambulance again and sent to emergency room  When her friend [name] met her at the emergency room, patient was mumbling, later had generalized tonic-clonic seizure, patient remember on her route to emergency room, no warning signs, wake up in hospital bed,  She was treated with Keppra  during hospital stay, EEG showed severe diffuse encephalopathy,  generalized rhythmic 2 to 3 Hz delta slowing,  She was not able to hold still for MRI, but diffusion-weighted imaging is negative for acute infarction  Laboratory evaluation showed B12 179, C-reactive protein 1.3, slight elevation of free T4, sodium 134, glucose 181, creatinine 1.5, CBC showed no significant abnormality  She was discharged without continuation of her Keppra   Long history of diabetes, bilateral feet paresthesia, generalized weakness, gait abnormality using walker now     PHYSICAL EXAM:   Vitals:   03/26/24 0835  BP: 128/88  Pulse: 74  Weight: 242 lb (109.8 kg)  Height: 5' 4 (1.626 m)   Body mass index is 41.54 kg/m.  PHYSICAL EXAMNIATION:  Gen: NAD, very pleasant elderly Caucasian female, conversant, well nourised, well groomed                     Cardiovascular: Regular rate rhythm, no peripheral edema, warm, nontender. Eyes: Conjunctivae clear without exudates or hemorrhage Neck: Supple, no carotid bruits. Pulmonary: Clear to auscultation bilaterally   NEUROLOGICAL EXAM:  MENTAL STATUS: Speech/cognition: Awake, alert, oriented to history taking and casual conversation  CRANIAL NERVES: CN II: Visual fields are full to confrontation. Pupils are round equal and briskly reactive to light. CN III, IV, VI: extraocular movement are normal. No ptosis. CN V: Facial sensation is intact to light touch CN VII: Face is symmetric with normal eye closure  CN VIII: Hearing is normal to causal conversation. CN IX, X: Phonation is normal. CN XI: Head turning and shoulder shrug are intact  MOTOR: There is no pronator drift of out-stretched arms. Muscle bulk and tone are normal. Muscle strength is normal.  REFLEXES: Reflexes are 1 and symmetric at the biceps, triceps, knees, and absent at ankles. Plantar responses are flexor.  SENSORY: Length-dependent decreased light touch, pinprick to distal shin level,  COORDINATION: There is no trunk or limb dysmetria  noted.  GAIT/STANCE: Push-up to get up from seated position, cautious unsteady, use of AD    REVIEW OF SYSTEMS:  Full 14 system review of systems performed and notable only for as above All other review of systems were negative.   ALLERGIES: Allergies  Allergen Reactions   Niacin Itching and Rash   Codeine Nausea And Vomiting and Nausea Only   Niaspan [Niacin Er (Antihyperlipidemic)] Nausea And Vomiting   Penicillins Rash   Protonix  [Pantoprazole ] Nausea Only    HOME MEDICATIONS: Current Outpatient Medications  Medication Sig Dispense Refill   acetaminophen  (TYLENOL ) 325 MG tablet Take 650 mg by mouth every 6 (six) hours as needed for mild pain, fever or headache.     ALPRAZolam  (XANAX ) 0.5 MG tablet Take 0.5 mg by mouth at bedtime as needed for sleep.     Blood Glucose Monitoring Suppl (ONE TOUCH ULTRA 2) w/Device KIT Use to check blood sugar as directed     cetirizine (ZYRTEC) 10 MG tablet Take 10 mg by mouth at bedtime.     Cholecalciferol (VITAMIN D) 2000 UNITS tablet Take 2,000 Units by mouth daily.     ciprofloxacin  (CIPRO ) 500 MG tablet Take 500 mg by mouth 2 (two) times daily.  clotrimazole -betamethasone  (LOTRISONE ) cream Apply 1 application topically 2 (two) times daily as needed (rash/irritation).     cyanocobalamin  1000 MCG tablet Take 1 tablet (1,000 mcg total) by mouth daily. 30 tablet 0   fenofibrate  160 MG tablet Take 160 mg by mouth daily before breakfast.     gabapentin  (NEURONTIN ) 300 MG capsule Take 300 mg by mouth 2 (two) times daily.     Glucose Blood (BLOOD GLUCOSE TEST STRIPS) STRP Test blood sugars twice daily. Dispense strips for Onetouch Ultra 2     hydrochlorothiazide  (HYDRODIURIL ) 12.5 MG tablet Take 12.5 mg by mouth daily.     insulin  NPH-regular Human (NOVOLIN 70/30) (70-30) 100 UNIT/ML injection Inject 30-72 Units into the skin 2 (two) times daily with a meal. Inject 72 units in the morning and 30 units at bedtime     Insulin  Pen Needle 31G X 6  MM MISC See admin instructions.     levETIRAcetam  (KEPPRA  XR) 750 MG 24 hr tablet Take 1 tablet (750 mg total) by mouth at bedtime. 30 tablet 11   methocarbamol  (ROBAXIN ) 500 MG tablet Take 1 tablet (500 mg total) by mouth every 8 (eight) hours as needed for muscle spasms. 30 tablet 0   metoprolol  succinate (TOPROL -XL) 50 MG 24 hr tablet Take 50 mg by mouth daily before breakfast. Take with or immediately following a meal.     metroNIDAZOLE  (FLAGYL ) 500 MG tablet Take 500 mg by mouth 3 (three) times daily.     omeprazole (PRILOSEC) 40 MG capsule Take 40 mg by mouth daily.     ondansetron  (ZOFRAN ) 4 MG tablet Take 1 tablet (4 mg total) by mouth every 8 (eight) hours as needed for nausea or vomiting. 12 tablet 0   pravastatin  (PRAVACHOL ) 20 MG tablet Take 20 mg by mouth at bedtime.      sertraline  (ZOLOFT ) 50 MG tablet Take 50 mg by mouth daily.     No current facility-administered medications for this visit.    PAST MEDICAL HISTORY: Past Medical History:  Diagnosis Date   Arthritis    left hip, neck   Dental crown present    Difficult airway    Dry mouth    GERD (gastroesophageal reflux disease)    History of asthma    no current problems or med.   History of kidney stones    Hyperlipidemia    Hypertension    under control with meds.   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 Cornett, MD;  Location: [facility];  Service: General;  Laterality: Right;   cataract surgery Bilateral 2024   EXTRACORPOREAL SHOCK WAVE LITHOTRIPSY  11/27/2012   LAPAROTOMY N/A 03/16/2021   Procedure: Diagnostic Laparoscopy, Exploratory Laparotomy with colon repair;  Surgeon: Dasie Leonor CROME, MD;  Location: [facility] OR;  Service:  General;  Laterality: N/A;   TUBAL LIGATION      FAMILY HISTORY: Family History  Problem Relation Age of Onset   Heart disease Father     SOCIAL HISTORY: Social History   Socioeconomic History   Marital status: Widowed    Spouse name: Not on file   Number of children: Not on file   Years of education: Not on file   Highest  education level: Not on file  Occupational History   Not on file  Tobacco Use   Smoking status: Never   Smokeless tobacco: Never  Vaping Use   Vaping status: Never Used  Substance and Sexual Activity   Alcohol use: No   Drug use: No   Sexual activity: Not on file  Other Topics Concern   Not on file  Social History Narrative   Not on file   Social Drivers of Health   Financial Resource Strain: Low Risk  (03/21/2024)   Received from [facility]   Overall Financial Resource Strain (CARDIA)    How hard is it for you to pay for the very basics like food, housing, medical care, and heating?: Not hard at all  Food Insecurity: No Food Insecurity (03/21/2024)   Received from [facility]   Hunger Vital Sign    Within the past 12 months, you worried that your food would run out before you got the money to buy more.: Never true    Within the past 12 months, the food you bought just didn't last and you didn't have money to get more.: Never true  Transportation Needs: No Transportation Needs (03/21/2024)   Received from [facility] - Transportation    In the past 12 months, has lack of transportation kept you from medical appointments or from getting medications?: No    In the past 12 months, has lack of transportation kept you from meetings, work, or from getting things needed for daily living?: No  Physical Activity: Inactive (03/21/2024)   Received from [facility]   Exercise Vital Sign    On average, how many days per week do you engage in moderate to strenuous exercise (like a brisk walk)?: 0 days    Minutes of Exercise per Session: Not  on file  Stress: No Stress Concern Present (03/21/2024)   Received from [facility] - Occupational Stress Questionnaire    Do you feel stress - tense, restless, nervous, or anxious, or unable to sleep at night because your mind is troubled all the time - these days?: Not at all  Social Connections: Socially Integrated (03/21/2024)   Received from [facility]   Social Network    How would you rate your social network (family, work, friends)?: Good participation with social networks  Intimate Partner Violence: Not At Risk (03/21/2024)   Received from [facility]   HITS    Over the last 12 months how often did your partner physically hurt you?: Never    Over the last 12 months how often did your partner insult you or talk down to you?: Never    Over the last 12 months how often did your partner threaten you with physical harm?: Never    Over the last 12 months how often did your partner scream or curse at you?: Never      I personally spent a total of 25 minutes in the care of the patient today including preparing to see the patient, performing a medically appropriate exam/evaluation, counseling and educating, placing orders, and documenting clinical information in the EHR.   Harlene Bogaert, AGNP-BC  [facility] [address] [place], [place] [address]  Phone [phone] Fax [phone] Note: This document was prepared with digital dictation and possible smart phrase technology. Any transcriptional errors that result from this process are unintentional.

## 2024-03-26 ENCOUNTER — Encounter: Payer: Self-pay | Admitting: Adult Health

## 2024-03-26 ENCOUNTER — Ambulatory Visit: Payer: Medicare Other | Admitting: Adult Health

## 2024-03-26 VITALS — BP 128/88 | HR 74 | Ht 64.0 in | Wt 242.0 lb

## 2024-03-26 DIAGNOSIS — G40909 Epilepsy, unspecified, not intractable, without status epilepticus: Secondary | ICD-10-CM | POA: Diagnosis not present

## 2024-03-26 MED ORDER — LEVETIRACETAM ER 750 MG PO TB24: 750.0000 mg | ORAL_TABLET | Freq: Every day | ORAL | 3 refills | Status: AC

## 2024-03-26 NOTE — Patient Instructions (Addendum)
 Your Plan:  Continue Keppra  XR 750 mg nightly for seizure prevention  Please call with any recurrent seizure activity     Follow up in 1 year or call earlier if needed      Thank you for coming to see us  at [facility]. I hope we have been able to provide you high quality care today.  You may receive a patient satisfaction survey over the next few weeks. We would appreciate your feedback and comments so that we may continue to improve ourselves and the health of our patients.

## 2025-04-01 ENCOUNTER — Ambulatory Visit: Admitting: Adult Health
